# Patient Record
Sex: Female | Born: 1935 | Race: White | Hispanic: No
Health system: Southern US, Community
[De-identification: ages and names within clinical notes are randomized; demographics above are authoritative.]

## PROBLEM LIST (undated history)

## (undated) DIAGNOSIS — H9190 Unspecified hearing loss, unspecified ear: Secondary | ICD-10-CM

## (undated) DIAGNOSIS — M810 Age-related osteoporosis without current pathological fracture: Secondary | ICD-10-CM

## (undated) DIAGNOSIS — I10 Essential (primary) hypertension: Secondary | ICD-10-CM

## (undated) DIAGNOSIS — R011 Cardiac murmur, unspecified: Secondary | ICD-10-CM

## (undated) DIAGNOSIS — Z8619 Personal history of other infectious and parasitic diseases: Secondary | ICD-10-CM

## (undated) DIAGNOSIS — K648 Other hemorrhoids: Secondary | ICD-10-CM

## (undated) DIAGNOSIS — Z860100 Personal history of colon polyps, unspecified: Secondary | ICD-10-CM

## (undated) DIAGNOSIS — IMO0002 Reserved for concepts with insufficient information to code with codable children: Secondary | ICD-10-CM

## (undated) HISTORY — DX: Personal history of colon polyps, unspecified: Z86.0100

## (undated) HISTORY — DX: Essential (primary) hypertension: I10

## (undated) HISTORY — DX: Reserved for concepts with insufficient information to code with codable children: IMO0002

## (undated) HISTORY — DX: Cardiac murmur, unspecified: R01.1

## (undated) HISTORY — DX: Personal history of colonic polyps: Z86.010

## (undated) HISTORY — DX: Other hemorrhoids: K64.8

## (undated) HISTORY — DX: Personal history of other infectious and parasitic diseases: Z86.19

---
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
## 2007-04-23 ENCOUNTER — Ambulatory Visit: Payer: Self-pay | Admitting: Gastroenterology

## 2009-04-05 ENCOUNTER — Ambulatory Visit: Payer: Self-pay

## 2010-04-11 ENCOUNTER — Ambulatory Visit: Payer: Self-pay | Admitting: *Deleted

## 2011-05-07 ENCOUNTER — Ambulatory Visit: Payer: Self-pay | Admitting: Internal Medicine

## 2011-06-27 DIAGNOSIS — E785 Hyperlipidemia, unspecified: Secondary | ICD-10-CM | POA: Diagnosis not present

## 2011-06-27 DIAGNOSIS — Z0000 Encounter for general adult medical examination without abnormal findings: Secondary | ICD-10-CM | POA: Diagnosis not present

## 2011-06-27 DIAGNOSIS — M949 Disorder of cartilage, unspecified: Secondary | ICD-10-CM | POA: Diagnosis not present

## 2011-06-27 DIAGNOSIS — Z79899 Other long term (current) drug therapy: Secondary | ICD-10-CM | POA: Diagnosis not present

## 2011-07-04 DIAGNOSIS — M810 Age-related osteoporosis without current pathological fracture: Secondary | ICD-10-CM | POA: Diagnosis not present

## 2011-07-04 DIAGNOSIS — M129 Arthropathy, unspecified: Secondary | ICD-10-CM | POA: Diagnosis not present

## 2011-07-04 DIAGNOSIS — J3089 Other allergic rhinitis: Secondary | ICD-10-CM | POA: Diagnosis not present

## 2011-07-04 DIAGNOSIS — I10 Essential (primary) hypertension: Secondary | ICD-10-CM | POA: Diagnosis not present

## 2011-07-25 DIAGNOSIS — R300 Dysuria: Secondary | ICD-10-CM | POA: Diagnosis not present

## 2011-08-05 DIAGNOSIS — N390 Urinary tract infection, site not specified: Secondary | ICD-10-CM | POA: Diagnosis not present

## 2011-08-15 DIAGNOSIS — J309 Allergic rhinitis, unspecified: Secondary | ICD-10-CM | POA: Diagnosis not present

## 2011-08-15 DIAGNOSIS — H903 Sensorineural hearing loss, bilateral: Secondary | ICD-10-CM | POA: Diagnosis not present

## 2011-08-15 DIAGNOSIS — H6120 Impacted cerumen, unspecified ear: Secondary | ICD-10-CM | POA: Diagnosis not present

## 2011-09-05 DIAGNOSIS — L570 Actinic keratosis: Secondary | ICD-10-CM | POA: Diagnosis not present

## 2011-09-05 DIAGNOSIS — Z85828 Personal history of other malignant neoplasm of skin: Secondary | ICD-10-CM | POA: Diagnosis not present

## 2011-09-05 DIAGNOSIS — L821 Other seborrheic keratosis: Secondary | ICD-10-CM | POA: Diagnosis not present

## 2011-10-15 DIAGNOSIS — R21 Rash and other nonspecific skin eruption: Secondary | ICD-10-CM | POA: Diagnosis not present

## 2011-11-07 DIAGNOSIS — I359 Nonrheumatic aortic valve disorder, unspecified: Secondary | ICD-10-CM | POA: Diagnosis not present

## 2011-11-07 DIAGNOSIS — R0602 Shortness of breath: Secondary | ICD-10-CM | POA: Diagnosis not present

## 2011-11-07 DIAGNOSIS — I119 Hypertensive heart disease without heart failure: Secondary | ICD-10-CM | POA: Diagnosis not present

## 2011-12-20 DIAGNOSIS — M549 Dorsalgia, unspecified: Secondary | ICD-10-CM | POA: Diagnosis not present

## 2011-12-23 DIAGNOSIS — M549 Dorsalgia, unspecified: Secondary | ICD-10-CM | POA: Diagnosis not present

## 2011-12-23 DIAGNOSIS — M545 Low back pain: Secondary | ICD-10-CM | POA: Diagnosis not present

## 2011-12-25 DIAGNOSIS — M6281 Muscle weakness (generalized): Secondary | ICD-10-CM | POA: Diagnosis not present

## 2011-12-25 DIAGNOSIS — R262 Difficulty in walking, not elsewhere classified: Secondary | ICD-10-CM | POA: Diagnosis not present

## 2011-12-25 DIAGNOSIS — M546 Pain in thoracic spine: Secondary | ICD-10-CM | POA: Diagnosis not present

## 2011-12-27 DIAGNOSIS — M6281 Muscle weakness (generalized): Secondary | ICD-10-CM | POA: Diagnosis not present

## 2011-12-27 DIAGNOSIS — M546 Pain in thoracic spine: Secondary | ICD-10-CM | POA: Diagnosis not present

## 2011-12-27 DIAGNOSIS — R262 Difficulty in walking, not elsewhere classified: Secondary | ICD-10-CM | POA: Diagnosis not present

## 2011-12-30 DIAGNOSIS — M546 Pain in thoracic spine: Secondary | ICD-10-CM | POA: Diagnosis not present

## 2011-12-30 DIAGNOSIS — R262 Difficulty in walking, not elsewhere classified: Secondary | ICD-10-CM | POA: Diagnosis not present

## 2011-12-30 DIAGNOSIS — M6281 Muscle weakness (generalized): Secondary | ICD-10-CM | POA: Diagnosis not present

## 2012-01-01 DIAGNOSIS — M546 Pain in thoracic spine: Secondary | ICD-10-CM | POA: Diagnosis not present

## 2012-01-01 DIAGNOSIS — M6281 Muscle weakness (generalized): Secondary | ICD-10-CM | POA: Diagnosis not present

## 2012-01-01 DIAGNOSIS — R262 Difficulty in walking, not elsewhere classified: Secondary | ICD-10-CM | POA: Diagnosis not present

## 2012-01-02 DIAGNOSIS — R262 Difficulty in walking, not elsewhere classified: Secondary | ICD-10-CM | POA: Diagnosis not present

## 2012-01-02 DIAGNOSIS — M6281 Muscle weakness (generalized): Secondary | ICD-10-CM | POA: Diagnosis not present

## 2012-01-02 DIAGNOSIS — M546 Pain in thoracic spine: Secondary | ICD-10-CM | POA: Diagnosis not present

## 2012-01-02 DIAGNOSIS — I10 Essential (primary) hypertension: Secondary | ICD-10-CM | POA: Diagnosis not present

## 2012-01-03 DIAGNOSIS — M546 Pain in thoracic spine: Secondary | ICD-10-CM | POA: Diagnosis not present

## 2012-01-03 DIAGNOSIS — R262 Difficulty in walking, not elsewhere classified: Secondary | ICD-10-CM | POA: Diagnosis not present

## 2012-01-03 DIAGNOSIS — M6281 Muscle weakness (generalized): Secondary | ICD-10-CM | POA: Diagnosis not present

## 2012-01-06 DIAGNOSIS — M6281 Muscle weakness (generalized): Secondary | ICD-10-CM | POA: Diagnosis not present

## 2012-01-06 DIAGNOSIS — M546 Pain in thoracic spine: Secondary | ICD-10-CM | POA: Diagnosis not present

## 2012-01-06 DIAGNOSIS — R262 Difficulty in walking, not elsewhere classified: Secondary | ICD-10-CM | POA: Diagnosis not present

## 2012-01-08 DIAGNOSIS — M546 Pain in thoracic spine: Secondary | ICD-10-CM | POA: Diagnosis not present

## 2012-01-08 DIAGNOSIS — R262 Difficulty in walking, not elsewhere classified: Secondary | ICD-10-CM | POA: Diagnosis not present

## 2012-01-08 DIAGNOSIS — M6281 Muscle weakness (generalized): Secondary | ICD-10-CM | POA: Diagnosis not present

## 2012-01-09 DIAGNOSIS — M546 Pain in thoracic spine: Secondary | ICD-10-CM | POA: Diagnosis not present

## 2012-01-09 DIAGNOSIS — R262 Difficulty in walking, not elsewhere classified: Secondary | ICD-10-CM | POA: Diagnosis not present

## 2012-01-09 DIAGNOSIS — M6281 Muscle weakness (generalized): Secondary | ICD-10-CM | POA: Diagnosis not present

## 2012-01-13 DIAGNOSIS — M6281 Muscle weakness (generalized): Secondary | ICD-10-CM | POA: Diagnosis not present

## 2012-01-13 DIAGNOSIS — R262 Difficulty in walking, not elsewhere classified: Secondary | ICD-10-CM | POA: Diagnosis not present

## 2012-01-13 DIAGNOSIS — M546 Pain in thoracic spine: Secondary | ICD-10-CM | POA: Diagnosis not present

## 2012-01-15 DIAGNOSIS — M546 Pain in thoracic spine: Secondary | ICD-10-CM | POA: Diagnosis not present

## 2012-01-15 DIAGNOSIS — R262 Difficulty in walking, not elsewhere classified: Secondary | ICD-10-CM | POA: Diagnosis not present

## 2012-01-15 DIAGNOSIS — M6281 Muscle weakness (generalized): Secondary | ICD-10-CM | POA: Diagnosis not present

## 2012-01-16 DIAGNOSIS — R262 Difficulty in walking, not elsewhere classified: Secondary | ICD-10-CM | POA: Diagnosis not present

## 2012-01-16 DIAGNOSIS — M6281 Muscle weakness (generalized): Secondary | ICD-10-CM | POA: Diagnosis not present

## 2012-01-16 DIAGNOSIS — M546 Pain in thoracic spine: Secondary | ICD-10-CM | POA: Diagnosis not present

## 2012-01-20 DIAGNOSIS — M546 Pain in thoracic spine: Secondary | ICD-10-CM | POA: Diagnosis not present

## 2012-01-20 DIAGNOSIS — R262 Difficulty in walking, not elsewhere classified: Secondary | ICD-10-CM | POA: Diagnosis not present

## 2012-01-20 DIAGNOSIS — M6281 Muscle weakness (generalized): Secondary | ICD-10-CM | POA: Diagnosis not present

## 2012-01-21 DIAGNOSIS — M549 Dorsalgia, unspecified: Secondary | ICD-10-CM | POA: Diagnosis not present

## 2012-01-21 DIAGNOSIS — J3089 Other allergic rhinitis: Secondary | ICD-10-CM | POA: Diagnosis not present

## 2012-01-21 DIAGNOSIS — I10 Essential (primary) hypertension: Secondary | ICD-10-CM | POA: Diagnosis not present

## 2012-01-21 DIAGNOSIS — E785 Hyperlipidemia, unspecified: Secondary | ICD-10-CM | POA: Diagnosis not present

## 2012-01-22 DIAGNOSIS — M546 Pain in thoracic spine: Secondary | ICD-10-CM | POA: Diagnosis not present

## 2012-01-22 DIAGNOSIS — M6281 Muscle weakness (generalized): Secondary | ICD-10-CM | POA: Diagnosis not present

## 2012-01-22 DIAGNOSIS — R262 Difficulty in walking, not elsewhere classified: Secondary | ICD-10-CM | POA: Diagnosis not present

## 2012-01-23 DIAGNOSIS — M6281 Muscle weakness (generalized): Secondary | ICD-10-CM | POA: Diagnosis not present

## 2012-01-23 DIAGNOSIS — M546 Pain in thoracic spine: Secondary | ICD-10-CM | POA: Diagnosis not present

## 2012-01-23 DIAGNOSIS — R262 Difficulty in walking, not elsewhere classified: Secondary | ICD-10-CM | POA: Diagnosis not present

## 2012-01-27 DIAGNOSIS — M6281 Muscle weakness (generalized): Secondary | ICD-10-CM | POA: Diagnosis not present

## 2012-01-27 DIAGNOSIS — R262 Difficulty in walking, not elsewhere classified: Secondary | ICD-10-CM | POA: Diagnosis not present

## 2012-01-27 DIAGNOSIS — M546 Pain in thoracic spine: Secondary | ICD-10-CM | POA: Diagnosis not present

## 2012-01-28 DIAGNOSIS — M546 Pain in thoracic spine: Secondary | ICD-10-CM | POA: Diagnosis not present

## 2012-01-28 DIAGNOSIS — M6281 Muscle weakness (generalized): Secondary | ICD-10-CM | POA: Diagnosis not present

## 2012-01-28 DIAGNOSIS — R262 Difficulty in walking, not elsewhere classified: Secondary | ICD-10-CM | POA: Diagnosis not present

## 2012-01-30 DIAGNOSIS — M546 Pain in thoracic spine: Secondary | ICD-10-CM | POA: Diagnosis not present

## 2012-01-30 DIAGNOSIS — R262 Difficulty in walking, not elsewhere classified: Secondary | ICD-10-CM | POA: Diagnosis not present

## 2012-01-30 DIAGNOSIS — M6281 Muscle weakness (generalized): Secondary | ICD-10-CM | POA: Diagnosis not present

## 2012-02-12 DIAGNOSIS — H6120 Impacted cerumen, unspecified ear: Secondary | ICD-10-CM | POA: Diagnosis not present

## 2012-02-12 DIAGNOSIS — H903 Sensorineural hearing loss, bilateral: Secondary | ICD-10-CM | POA: Diagnosis not present

## 2012-03-17 DIAGNOSIS — L578 Other skin changes due to chronic exposure to nonionizing radiation: Secondary | ICD-10-CM | POA: Diagnosis not present

## 2012-03-17 DIAGNOSIS — Z85828 Personal history of other malignant neoplasm of skin: Secondary | ICD-10-CM | POA: Diagnosis not present

## 2012-03-17 DIAGNOSIS — L570 Actinic keratosis: Secondary | ICD-10-CM | POA: Diagnosis not present

## 2012-03-25 DIAGNOSIS — G894 Chronic pain syndrome: Secondary | ICD-10-CM | POA: Diagnosis not present

## 2012-03-25 DIAGNOSIS — M549 Dorsalgia, unspecified: Secondary | ICD-10-CM | POA: Diagnosis not present

## 2012-03-31 DIAGNOSIS — M47814 Spondylosis without myelopathy or radiculopathy, thoracic region: Secondary | ICD-10-CM | POA: Diagnosis not present

## 2012-03-31 DIAGNOSIS — M5414 Radiculopathy, thoracic region: Secondary | ICD-10-CM | POA: Diagnosis not present

## 2012-04-03 ENCOUNTER — Ambulatory Visit: Payer: Self-pay | Admitting: Physical Medicine and Rehabilitation

## 2012-04-03 DIAGNOSIS — M5124 Other intervertebral disc displacement, thoracic region: Secondary | ICD-10-CM | POA: Diagnosis not present

## 2012-04-03 DIAGNOSIS — M8448XA Pathological fracture, other site, initial encounter for fracture: Secondary | ICD-10-CM | POA: Diagnosis not present

## 2012-04-03 DIAGNOSIS — S22009A Unspecified fracture of unspecified thoracic vertebra, initial encounter for closed fracture: Secondary | ICD-10-CM | POA: Diagnosis not present

## 2012-04-08 DIAGNOSIS — R142 Eructation: Secondary | ICD-10-CM | POA: Diagnosis not present

## 2012-04-08 DIAGNOSIS — R141 Gas pain: Secondary | ICD-10-CM | POA: Diagnosis not present

## 2012-04-08 DIAGNOSIS — R634 Abnormal weight loss: Secondary | ICD-10-CM | POA: Diagnosis not present

## 2012-04-08 DIAGNOSIS — N952 Postmenopausal atrophic vaginitis: Secondary | ICD-10-CM | POA: Diagnosis not present

## 2012-04-10 DIAGNOSIS — M5414 Radiculopathy, thoracic region: Secondary | ICD-10-CM | POA: Diagnosis not present

## 2012-04-10 DIAGNOSIS — M47814 Spondylosis without myelopathy or radiculopathy, thoracic region: Secondary | ICD-10-CM | POA: Diagnosis not present

## 2012-04-15 DIAGNOSIS — R634 Abnormal weight loss: Secondary | ICD-10-CM | POA: Diagnosis not present

## 2012-04-15 DIAGNOSIS — R142 Eructation: Secondary | ICD-10-CM | POA: Diagnosis not present

## 2012-04-15 DIAGNOSIS — N952 Postmenopausal atrophic vaginitis: Secondary | ICD-10-CM | POA: Diagnosis not present

## 2012-04-15 DIAGNOSIS — R141 Gas pain: Secondary | ICD-10-CM | POA: Diagnosis not present

## 2012-04-21 DIAGNOSIS — M47817 Spondylosis without myelopathy or radiculopathy, lumbosacral region: Secondary | ICD-10-CM | POA: Diagnosis not present

## 2012-04-21 DIAGNOSIS — IMO0002 Reserved for concepts with insufficient information to code with codable children: Secondary | ICD-10-CM | POA: Diagnosis not present

## 2012-04-21 DIAGNOSIS — M47814 Spondylosis without myelopathy or radiculopathy, thoracic region: Secondary | ICD-10-CM | POA: Diagnosis not present

## 2012-04-30 DIAGNOSIS — Z23 Encounter for immunization: Secondary | ICD-10-CM | POA: Diagnosis not present

## 2012-05-12 DIAGNOSIS — M47814 Spondylosis without myelopathy or radiculopathy, thoracic region: Secondary | ICD-10-CM | POA: Diagnosis not present

## 2012-05-12 DIAGNOSIS — IMO0002 Reserved for concepts with insufficient information to code with codable children: Secondary | ICD-10-CM | POA: Diagnosis not present

## 2012-05-12 DIAGNOSIS — M47817 Spondylosis without myelopathy or radiculopathy, lumbosacral region: Secondary | ICD-10-CM | POA: Diagnosis not present

## 2012-05-19 DIAGNOSIS — I119 Hypertensive heart disease without heart failure: Secondary | ICD-10-CM | POA: Diagnosis not present

## 2012-05-19 DIAGNOSIS — R0602 Shortness of breath: Secondary | ICD-10-CM | POA: Diagnosis not present

## 2012-05-19 DIAGNOSIS — I359 Nonrheumatic aortic valve disorder, unspecified: Secondary | ICD-10-CM | POA: Diagnosis not present

## 2012-05-29 DIAGNOSIS — IMO0002 Reserved for concepts with insufficient information to code with codable children: Secondary | ICD-10-CM | POA: Diagnosis not present

## 2012-05-29 DIAGNOSIS — M47817 Spondylosis without myelopathy or radiculopathy, lumbosacral region: Secondary | ICD-10-CM | POA: Diagnosis not present

## 2012-06-05 DIAGNOSIS — H02839 Dermatochalasis of unspecified eye, unspecified eyelid: Secondary | ICD-10-CM | POA: Diagnosis not present

## 2012-06-09 ENCOUNTER — Ambulatory Visit: Payer: Self-pay | Admitting: Internal Medicine

## 2012-06-09 DIAGNOSIS — Z1231 Encounter for screening mammogram for malignant neoplasm of breast: Secondary | ICD-10-CM | POA: Diagnosis not present

## 2012-06-29 DIAGNOSIS — E785 Hyperlipidemia, unspecified: Secondary | ICD-10-CM | POA: Diagnosis not present

## 2012-06-30 DIAGNOSIS — IMO0002 Reserved for concepts with insufficient information to code with codable children: Secondary | ICD-10-CM | POA: Diagnosis not present

## 2012-06-30 DIAGNOSIS — M48062 Spinal stenosis, lumbar region with neurogenic claudication: Secondary | ICD-10-CM | POA: Diagnosis not present

## 2012-06-30 DIAGNOSIS — M47817 Spondylosis without myelopathy or radiculopathy, lumbosacral region: Secondary | ICD-10-CM | POA: Diagnosis not present

## 2012-07-06 DIAGNOSIS — G894 Chronic pain syndrome: Secondary | ICD-10-CM | POA: Diagnosis not present

## 2012-07-06 DIAGNOSIS — M549 Dorsalgia, unspecified: Secondary | ICD-10-CM | POA: Diagnosis not present

## 2012-07-06 DIAGNOSIS — I10 Essential (primary) hypertension: Secondary | ICD-10-CM | POA: Diagnosis not present

## 2012-07-09 ENCOUNTER — Ambulatory Visit: Payer: Self-pay | Admitting: Gastroenterology

## 2012-07-09 DIAGNOSIS — Z860100 Personal history of colon polyps, unspecified: Secondary | ICD-10-CM | POA: Diagnosis not present

## 2012-07-09 DIAGNOSIS — I10 Essential (primary) hypertension: Secondary | ICD-10-CM | POA: Diagnosis not present

## 2012-07-09 DIAGNOSIS — M545 Low back pain, unspecified: Secondary | ICD-10-CM | POA: Diagnosis not present

## 2012-07-09 DIAGNOSIS — Z7982 Long term (current) use of aspirin: Secondary | ICD-10-CM | POA: Diagnosis not present

## 2012-07-09 DIAGNOSIS — Z79899 Other long term (current) drug therapy: Secondary | ICD-10-CM | POA: Diagnosis not present

## 2012-07-09 DIAGNOSIS — Z09 Encounter for follow-up examination after completed treatment for conditions other than malignant neoplasm: Secondary | ICD-10-CM | POA: Diagnosis not present

## 2012-07-09 DIAGNOSIS — Z800 Family history of malignant neoplasm of digestive organs: Secondary | ICD-10-CM | POA: Diagnosis not present

## 2012-07-09 DIAGNOSIS — K5730 Diverticulosis of large intestine without perforation or abscess without bleeding: Secondary | ICD-10-CM | POA: Diagnosis not present

## 2012-07-09 DIAGNOSIS — R011 Cardiac murmur, unspecified: Secondary | ICD-10-CM | POA: Diagnosis not present

## 2012-07-09 LAB — HM COLONOSCOPY

## 2012-09-03 DIAGNOSIS — Z85828 Personal history of other malignant neoplasm of skin: Secondary | ICD-10-CM | POA: Diagnosis not present

## 2012-09-03 DIAGNOSIS — L821 Other seborrheic keratosis: Secondary | ICD-10-CM | POA: Diagnosis not present

## 2012-09-03 DIAGNOSIS — L570 Actinic keratosis: Secondary | ICD-10-CM | POA: Diagnosis not present

## 2012-09-30 DIAGNOSIS — M47817 Spondylosis without myelopathy or radiculopathy, lumbosacral region: Secondary | ICD-10-CM | POA: Diagnosis not present

## 2012-09-30 DIAGNOSIS — IMO0002 Reserved for concepts with insufficient information to code with codable children: Secondary | ICD-10-CM | POA: Diagnosis not present

## 2012-11-19 DIAGNOSIS — I119 Hypertensive heart disease without heart failure: Secondary | ICD-10-CM | POA: Diagnosis not present

## 2012-11-19 DIAGNOSIS — R0602 Shortness of breath: Secondary | ICD-10-CM | POA: Diagnosis not present

## 2012-11-19 DIAGNOSIS — I359 Nonrheumatic aortic valve disorder, unspecified: Secondary | ICD-10-CM | POA: Diagnosis not present

## 2012-11-30 DIAGNOSIS — R0602 Shortness of breath: Secondary | ICD-10-CM | POA: Diagnosis not present

## 2012-11-30 DIAGNOSIS — I119 Hypertensive heart disease without heart failure: Secondary | ICD-10-CM | POA: Diagnosis not present

## 2012-11-30 DIAGNOSIS — E782 Mixed hyperlipidemia: Secondary | ICD-10-CM | POA: Diagnosis not present

## 2012-11-30 DIAGNOSIS — I359 Nonrheumatic aortic valve disorder, unspecified: Secondary | ICD-10-CM | POA: Diagnosis not present

## 2012-12-31 DIAGNOSIS — I10 Essential (primary) hypertension: Secondary | ICD-10-CM | POA: Diagnosis not present

## 2013-01-04 DIAGNOSIS — R0602 Shortness of breath: Secondary | ICD-10-CM | POA: Diagnosis not present

## 2013-01-04 DIAGNOSIS — I359 Nonrheumatic aortic valve disorder, unspecified: Secondary | ICD-10-CM | POA: Diagnosis not present

## 2013-01-07 DIAGNOSIS — I119 Hypertensive heart disease without heart failure: Secondary | ICD-10-CM | POA: Diagnosis not present

## 2013-01-07 DIAGNOSIS — I359 Nonrheumatic aortic valve disorder, unspecified: Secondary | ICD-10-CM | POA: Diagnosis not present

## 2013-01-07 DIAGNOSIS — I495 Sick sinus syndrome: Secondary | ICD-10-CM | POA: Diagnosis not present

## 2013-01-07 DIAGNOSIS — I519 Heart disease, unspecified: Secondary | ICD-10-CM | POA: Diagnosis not present

## 2013-01-11 DIAGNOSIS — I10 Essential (primary) hypertension: Secondary | ICD-10-CM | POA: Diagnosis not present

## 2013-01-11 DIAGNOSIS — M129 Arthropathy, unspecified: Secondary | ICD-10-CM | POA: Diagnosis not present

## 2013-01-18 DIAGNOSIS — H6120 Impacted cerumen, unspecified ear: Secondary | ICD-10-CM | POA: Diagnosis not present

## 2013-01-18 DIAGNOSIS — H903 Sensorineural hearing loss, bilateral: Secondary | ICD-10-CM | POA: Diagnosis not present

## 2013-03-11 DIAGNOSIS — I495 Sick sinus syndrome: Secondary | ICD-10-CM | POA: Diagnosis not present

## 2013-03-11 DIAGNOSIS — I119 Hypertensive heart disease without heart failure: Secondary | ICD-10-CM | POA: Diagnosis not present

## 2013-03-11 DIAGNOSIS — E782 Mixed hyperlipidemia: Secondary | ICD-10-CM | POA: Diagnosis not present

## 2013-03-11 DIAGNOSIS — I059 Rheumatic mitral valve disease, unspecified: Secondary | ICD-10-CM | POA: Diagnosis not present

## 2013-03-17 ENCOUNTER — Encounter: Payer: Self-pay | Admitting: Internal Medicine

## 2013-03-17 ENCOUNTER — Ambulatory Visit (INDEPENDENT_AMBULATORY_CARE_PROVIDER_SITE_OTHER): Payer: Medicare Other | Admitting: Internal Medicine

## 2013-03-17 VITALS — BP 110/80 | HR 62 | Temp 98.0°F | Ht 61.5 in | Wt 127.0 lb

## 2013-03-17 DIAGNOSIS — A6000 Herpesviral infection of urogenital system, unspecified: Secondary | ICD-10-CM

## 2013-03-17 DIAGNOSIS — A6009 Herpesviral infection of other urogenital tract: Secondary | ICD-10-CM

## 2013-03-17 DIAGNOSIS — R140 Abdominal distension (gaseous): Secondary | ICD-10-CM

## 2013-03-17 DIAGNOSIS — T148XXA Other injury of unspecified body region, initial encounter: Secondary | ICD-10-CM

## 2013-03-17 DIAGNOSIS — R141 Gas pain: Secondary | ICD-10-CM

## 2013-03-17 DIAGNOSIS — Z860100 Personal history of colon polyps, unspecified: Secondary | ICD-10-CM

## 2013-03-17 DIAGNOSIS — R634 Abnormal weight loss: Secondary | ICD-10-CM

## 2013-03-17 DIAGNOSIS — IMO0002 Reserved for concepts with insufficient information to code with codable children: Secondary | ICD-10-CM

## 2013-03-17 DIAGNOSIS — I10 Essential (primary) hypertension: Secondary | ICD-10-CM

## 2013-03-17 NOTE — Patient Instructions (Addendum)
Align - one per day 

## 2013-03-18 ENCOUNTER — Ambulatory Visit: Payer: Self-pay | Admitting: Internal Medicine

## 2013-03-20 ENCOUNTER — Encounter: Payer: Self-pay | Admitting: Internal Medicine

## 2013-03-20 DIAGNOSIS — R634 Abnormal weight loss: Secondary | ICD-10-CM | POA: Insufficient documentation

## 2013-03-20 DIAGNOSIS — R140 Abdominal distension (gaseous): Secondary | ICD-10-CM | POA: Insufficient documentation

## 2013-03-20 DIAGNOSIS — Z86010 Personal history of colonic polyps: Secondary | ICD-10-CM | POA: Insufficient documentation

## 2013-03-20 DIAGNOSIS — A6009 Herpesviral infection of other urogenital tract: Secondary | ICD-10-CM | POA: Insufficient documentation

## 2013-03-20 DIAGNOSIS — Z8781 Personal history of (healed) traumatic fracture: Secondary | ICD-10-CM | POA: Insufficient documentation

## 2013-03-20 DIAGNOSIS — I10 Essential (primary) hypertension: Secondary | ICD-10-CM | POA: Insufficient documentation

## 2013-03-20 NOTE — Assessment & Plan Note (Signed)
Has a flare 2-3x/year.  Takes valtrex prn.  Follow.

## 2013-03-20 NOTE — Assessment & Plan Note (Signed)
Obtain records to review previously work up.  Follow.

## 2013-03-20 NOTE — Assessment & Plan Note (Signed)
Blood pressure under good control.  Same medication regimen.  Follow metabolic panel.   

## 2013-03-20 NOTE — Progress Notes (Signed)
Subjective:    Patient ID: Lisa Blackwell, female    DOB: [birth year], 79 y.o.   MRN: [id number]  HPI 79 year old female with past history of compression fractures, hypertension and colon polyps who comes in today to follow up on these issues as well as to establish care.  She was previously seeing Dr Alonna Buckler.  She is seeing Dr Yves Dill for back pain.  Has had thoracic compression fractures.  Previously went to physical therapy.  Helped.  Had reoccurring pain.  Dr Yves Dill gave injections.  Helped.  Takes hydrocodone (1/2 tablet q hs prn).  Also using patches.   She has lost weight.  Stomach bloated.  Saw gyn (Dr Beatris Si).  Pelvic ultrasound negative.  Colonoscopy 1/14.  No polyps.  Diverticulosis.  No constipation.  Eating.  No abdominal pain.  No urine change.  No blood in her stool.  No acid reflux.     Past Medical History  Diagnosis Date  . History of chicken pox   . Heart murmur   . Hypertension   . Compression fracture     Spine  . Hx of colonic polyp     Outpatient Encounter Prescriptions as of 03/17/2013  Medication Sig Dispense Refill  . aspirin EC 81 MG tablet Take 81 mg by mouth daily.      . calcium citrate-vitamin D (CITRACAL+D) 315-200 MG-UNIT per tablet Take 1 tablet by mouth daily.      . Cholecalciferol (VITAMIN D) 2000 UNITS tablet Take 2,000 Units by mouth daily.      Marland Kitchen gabapentin (NEURONTIN) 100 MG capsule Take 100 mg by mouth 3 (three) times daily.      Marland Kitchen HYDROcodone-acetaminophen (NORCO/VICODIN) 5-325 MG per tablet Take 0.5 tablets by mouth every 6 (six) hours as needed for pain.      Marland Kitchen ipratropium (ATROVENT) 0.03 % nasal spray Place 2 sprays into the nose every 12 (twelve) hours.      . raloxifene (EVISTA) 60 MG tablet Take 60 mg by mouth daily.      Marland Kitchen telmisartan-hydrochlorothiazide (MICARDIS HCT) 40-12.5 MG per tablet Take 0.5 tablets by mouth daily.      . valACYclovir (VALTREX) 500 MG tablet Take 500 mg by mouth 2 (two) times daily as needed.       No  facility-administered encounter medications on file as of 03/17/2013.    Review of Systems Patient denies any headache, lightheadedness or dizziness.  No sinus or allergy symptoms.  No chest pain, tightness or palpitations.  No increased shortness of breath, cough or congestion.  No nausea or vomiting.  No acid reflux.  No abdominal pain or cramping.  Does report bloating.  Weight loss.  No bowel change, such as diarrhea, constipation, BRBPR or melana.  No urine change.  Back pain.  Seeing Dr Yves Dill.  Previous episode of syncope.  Worked up by cardiology.  No further episodes.  Living at [facility].       Objective:   Physical Exam Filed Vitals:   03/17/13 1552  BP: 110/80  Pulse: 62  Temp: 98 F (78.63 C)   79 year old female in no acute distress.   HEENT:  Nares- clear.  Oropharynx - without lesions. NECK:  Supple.  Nontender.  No audible bruit.  HEART:  Appears to be regular. LUNGS:  No crackles or wheezing audible.  Respirations even and unlabored.  RADIAL PULSE:  Equal bilaterally.  ABDOMEN:  Soft, nontender.  Bowel sounds present and normal.  No audible  abdominal bruit.    EXTREMITIES:  No increased edema present.  DP pulses palpable and equal bilaterally.          Assessment & Plan:  HEALTH MAINTENANCE.  Schedule her for a physical when due.  Obtain records to review.  Colonoscopy 1/14.  Mammogram normal 06/09/12.  Last pap 10/10.    I spent 45 minutes with the patient and more than 50% of the times was spent in consultation regarding the above.

## 2013-03-20 NOTE — Assessment & Plan Note (Signed)
Last colonoscopy 1/14.  Followed by Dr Bluford Kaufmann.  Obtain records.

## 2013-03-20 NOTE — Assessment & Plan Note (Signed)
Start align daily.  Obtain records.  Get her back in soon to reassess.  Further w/up pending.

## 2013-03-20 NOTE — Assessment & Plan Note (Signed)
Back pain.  S/p injections.  Followed by Dr Yves Dill.

## 2013-04-04 DIAGNOSIS — Z23 Encounter for immunization: Secondary | ICD-10-CM | POA: Diagnosis not present

## 2013-05-06 ENCOUNTER — Ambulatory Visit (INDEPENDENT_AMBULATORY_CARE_PROVIDER_SITE_OTHER): Payer: BC Managed Care – PPO | Admitting: Internal Medicine

## 2013-05-06 ENCOUNTER — Encounter: Payer: Self-pay | Admitting: Internal Medicine

## 2013-05-06 VITALS — BP 130/80 | HR 64 | Temp 98.2°F | Ht 62.0 in | Wt 127.5 lb

## 2013-05-06 DIAGNOSIS — I10 Essential (primary) hypertension: Secondary | ICD-10-CM

## 2013-05-06 DIAGNOSIS — R141 Gas pain: Secondary | ICD-10-CM | POA: Diagnosis not present

## 2013-05-06 DIAGNOSIS — K649 Unspecified hemorrhoids: Secondary | ICD-10-CM

## 2013-05-06 DIAGNOSIS — T148XXA Other injury of unspecified body region, initial encounter: Secondary | ICD-10-CM

## 2013-05-06 DIAGNOSIS — A6009 Herpesviral infection of other urogenital tract: Secondary | ICD-10-CM

## 2013-05-06 DIAGNOSIS — Z86010 Personal history of colonic polyps: Secondary | ICD-10-CM

## 2013-05-06 DIAGNOSIS — R140 Abdominal distension (gaseous): Secondary | ICD-10-CM

## 2013-05-06 DIAGNOSIS — IMO0002 Reserved for concepts with insufficient information to code with codable children: Secondary | ICD-10-CM

## 2013-05-06 DIAGNOSIS — Z780 Asymptomatic menopausal state: Secondary | ICD-10-CM

## 2013-05-06 DIAGNOSIS — Z13220 Encounter for screening for lipoid disorders: Secondary | ICD-10-CM

## 2013-05-06 DIAGNOSIS — A6000 Herpesviral infection of urogenital system, unspecified: Secondary | ICD-10-CM

## 2013-05-06 DIAGNOSIS — R634 Abnormal weight loss: Secondary | ICD-10-CM

## 2013-05-06 NOTE — Progress Notes (Signed)
Pre-visit discussion using our clinic review tool. No additional management support is needed unless otherwise documented below in the visit note.  

## 2013-05-09 ENCOUNTER — Encounter: Payer: Self-pay | Admitting: Internal Medicine

## 2013-05-09 DIAGNOSIS — K649 Unspecified hemorrhoids: Secondary | ICD-10-CM | POA: Insufficient documentation

## 2013-05-09 NOTE — Assessment & Plan Note (Signed)
Noted on exam.  Had bright red blood last week.  None since.  Treat with annusol HC suppositories as directed.  Notify me if persistent problems.

## 2013-05-09 NOTE — Assessment & Plan Note (Signed)
Has a flare 2-3x/year.  Takes valtrex prn.  Follow.

## 2013-05-09 NOTE — Assessment & Plan Note (Signed)
Last colonoscopy 1/14.  Followed by Dr Oh.    

## 2013-05-09 NOTE — Assessment & Plan Note (Signed)
Back pain.  S/p injections.  Followed by Dr Yves Dill.

## 2013-05-09 NOTE — Progress Notes (Signed)
Subjective:    Patient ID: Lisa Blackwell, female    DOB: Nov 04, 1925, 79 y.o.   MRN: [id number]  HPI 79 year old female with past history of compression fractures, hypertension and colon polyps who comes in today to follow up on these issues as well as for a complete physical exam.  She is seeing Dr Yves Dill for back pain.  Has had thoracic compression fractures.  Previously went to physical therapy.  Helped.  Had reoccurring pain.  Dr Yves Dill gave injections.  Helped.  Takes hydrocodone (1/2 tablet q hs prn).  Also using patches.   She has lost weight.  Stomach has been feeling bloated.  Saw gyn (Dr Beatris Si).  Pelvic ultrasound negative.  Colonoscopy 1/14.  No polyps.  Diverticulosis.  No constipation.  Eating.  No abdominal pain.  No urine change.  No blood in her stool.  No acid reflux.  We started on a probiotic last visit.  She states this has helped.  She feels better.  Weight is stable.  Breathing stable.     Past Medical History  Diagnosis Date  . History of chicken pox   . Heart murmur   . Hypertension   . Compression fracture     Spine  . Hx of colonic polyp     Outpatient Encounter Prescriptions as of 05/06/2013  Medication Sig  . aspirin EC 81 MG tablet Take 81 mg by mouth daily.  . calcium citrate-vitamin D (CITRACAL+D) 315-200 MG-UNIT per tablet Take 1 tablet by mouth daily.  . Cholecalciferol (VITAMIN D) 2000 UNITS tablet Take 2,000 Units by mouth daily.  Marland Kitchen gabapentin (NEURONTIN) 100 MG capsule Take 100 mg by mouth 3 (three) times daily.  Marland Kitchen HYDROcodone-acetaminophen (NORCO/VICODIN) 5-325 MG per tablet Take 0.5 tablets by mouth every 6 (six) hours as needed for pain.  Marland Kitchen ipratropium (ATROVENT) 0.03 % nasal spray Place 2 sprays into the nose every 12 (twelve) hours.  . Probiotic Product (ALIGN PO) Take by mouth daily.  . raloxifene (EVISTA) 60 MG tablet Take 60 mg by mouth daily.  Marland Kitchen telmisartan-hydrochlorothiazide (MICARDIS HCT) 40-12.5 MG per tablet Take 0.5 tablets by  mouth daily.  . valACYclovir (VALTREX) 500 MG tablet Take 500 mg by mouth 2 (two) times daily as needed.    Review of Systems Patient denies any headache, lightheadedness or dizziness.  No sinus or allergy symptoms.  No chest pain, tightness or palpitations.  No increased shortness of breath, cough or congestion.  No nausea or vomiting.  No acid reflux.  No abdominal pain or cramping.  Bloating better.  Weight stable from last check.   No bowel change, such as diarrhea, constipation, or melana.  Did notice some BRB last week.  States has hemorrhoids.  No blood since.  No urine change.  Back pain.  Seeing Dr Yves Dill.  Previous episode of syncope.  Worked up by cardiology.  No further episodes.  Living at [facility].       Objective:   Physical Exam  Filed Vitals:   05/06/13 1422  BP: 130/80  Pulse: 64  Temp: 98.2 F (36.8 C)   Blood pressure recheck:  98/39  79 year old female in no acute distress.   HEENT:  Nares- clear.  Oropharynx - without lesions. NECK:  Supple.  Nontender.  No audible bruit.  HEART:  Appears to be regular. LUNGS:  No crackles or wheezing audible.  Respirations even and unlabored.  RADIAL PULSE:  Equal bilaterally.    BREASTS:  No nipple  discharge or nipple retraction present.  Could not appreciate any distinct nodules or axillary adenopathy.  ABDOMEN:  Soft, nontender.  Bowel sounds present and normal.  No audible abdominal bruit.  GU:  Not performed.    RECTAL:  Hemorrhoid present.  Heme negative.    EXTREMITIES:  No increased edema present.  DP pulses palpable and equal bilaterally.          Assessment & Plan:  HEALTH MAINTENANCE.  Physical today.   Colonoscopy 1/14.  Mammogram normal 06/09/12.  Last pap 10/10.

## 2013-05-09 NOTE — Assessment & Plan Note (Signed)
Blood pressure under good control.  Same medication regimen.  Follow metabolic panel.   

## 2013-05-09 NOTE — Assessment & Plan Note (Signed)
Started align daily after last visit.  Symptoms have improved.  Has had recent colonoscopy and gyn exam.  Will follow.  Feels better.

## 2013-05-09 NOTE — Assessment & Plan Note (Signed)
Weight stable.  Colonoscopy and gyn w/up as outlined.

## 2013-05-10 ENCOUNTER — Encounter: Payer: Self-pay | Admitting: Internal Medicine

## 2013-05-10 DIAGNOSIS — I10 Essential (primary) hypertension: Secondary | ICD-10-CM

## 2013-05-11 ENCOUNTER — Encounter: Payer: Self-pay | Admitting: Internal Medicine

## 2013-05-11 ENCOUNTER — Encounter: Payer: Self-pay | Admitting: Emergency Medicine

## 2013-05-12 ENCOUNTER — Other Ambulatory Visit (INDEPENDENT_AMBULATORY_CARE_PROVIDER_SITE_OTHER): Payer: Medicare Other

## 2013-05-12 DIAGNOSIS — R634 Abnormal weight loss: Secondary | ICD-10-CM

## 2013-05-12 DIAGNOSIS — Z13220 Encounter for screening for lipoid disorders: Secondary | ICD-10-CM | POA: Diagnosis not present

## 2013-05-12 DIAGNOSIS — I10 Essential (primary) hypertension: Secondary | ICD-10-CM

## 2013-05-12 LAB — CBC WITH DIFFERENTIAL/PLATELET
Basophils Absolute: 0.1 10*3/uL (ref 0.0–0.1)
Basophils Relative: 1 % (ref 0.0–3.0)
Eosinophils Absolute: 0 10*3/uL (ref 0.0–0.7)
Hemoglobin: 13.1 g/dL (ref 12.0–15.0)
Lymphocytes Relative: 33.3 % (ref 12.0–46.0)
Lymphs Abs: 1.8 10*3/uL (ref 0.7–4.0)
MCHC: 33.5 g/dL (ref 30.0–36.0)
Monocytes Relative: 7.3 % (ref 3.0–12.0)
Neutro Abs: 3.1 10*3/uL (ref 1.4–7.7)
Platelets: 139 10*3/uL — ABNORMAL LOW (ref 150.0–400.0)
RBC: 4.45 Mil/uL (ref 3.87–5.11)
RDW: 13.6 % (ref 11.5–14.6)

## 2013-05-12 LAB — HEPATIC FUNCTION PANEL
AST: 19 U/L (ref 0–37)
Albumin: 3.6 g/dL (ref 3.5–5.2)
Total Bilirubin: 0.8 mg/dL (ref 0.3–1.2)
Total Protein: 6.4 g/dL (ref 6.0–8.3)

## 2013-05-12 LAB — LIPID PANEL
Cholesterol: 188 mg/dL (ref 0–200)
HDL: 74 mg/dL (ref >39.00)
LDL Cholesterol: 101 mg/dL — ABNORMAL HIGH (ref 0–99)
Total CHOL/HDL Ratio: 3
Triglycerides: 65 mg/dL (ref 0.0–149.0)
VLDL: 13 mg/dL (ref 0.0–40.0)

## 2013-05-12 LAB — BASIC METABOLIC PANEL
CO2: 32 mEq/L (ref 19–32)
Calcium: 9.3 mg/dL (ref 8.4–10.5)
GFR: 84.41 mL/min (ref >60.00)
Glucose, Bld: 81 mg/dL (ref 70–99)
Potassium: 3.8 mEq/L (ref 3.5–5.1)
Sodium: 140 mEq/L (ref 135–145)

## 2013-05-13 ENCOUNTER — Encounter: Payer: Self-pay | Admitting: Internal Medicine

## 2013-05-13 ENCOUNTER — Telehealth: Payer: Self-pay | Admitting: Internal Medicine

## 2013-05-13 DIAGNOSIS — D696 Thrombocytopenia, unspecified: Secondary | ICD-10-CM

## 2013-05-13 NOTE — Telephone Encounter (Signed)
Pt notified of lab results via my chart.  Needs a non fasting within the next two weeks.  Please schedule her lab appt and contact her with the appt date and time.  Thanks.

## 2013-05-14 NOTE — Telephone Encounter (Signed)
Appointment 12/3 pt aware

## 2013-05-17 ENCOUNTER — Encounter: Payer: Self-pay | Admitting: Internal Medicine

## 2013-05-26 ENCOUNTER — Other Ambulatory Visit (INDEPENDENT_AMBULATORY_CARE_PROVIDER_SITE_OTHER): Payer: BC Managed Care – PPO

## 2013-05-26 ENCOUNTER — Telehealth: Payer: Self-pay | Admitting: Internal Medicine

## 2013-05-26 DIAGNOSIS — M899 Disorder of bone, unspecified: Secondary | ICD-10-CM | POA: Diagnosis not present

## 2013-05-26 DIAGNOSIS — D696 Thrombocytopenia, unspecified: Secondary | ICD-10-CM | POA: Diagnosis not present

## 2013-05-26 DIAGNOSIS — N951 Menopausal and female climacteric states: Secondary | ICD-10-CM | POA: Diagnosis not present

## 2013-05-26 NOTE — Telephone Encounter (Signed)
I spoke with Lisa Blackwell & notified her that we have noted that she received her Tetanus in May 2013. Lisa Blackwell doesn't feel that this is correct. She will call and have Dr. Threasa Beards fax over a copy of her immunization records.

## 2013-05-26 NOTE — Telephone Encounter (Signed)
Noted.  Can review once we get and then determine if has had.  Thanks.

## 2013-05-26 NOTE — Telephone Encounter (Signed)
Pt is wondering if should could get the pneumonia and tetnus shot ??

## 2013-05-27 ENCOUNTER — Encounter: Payer: Self-pay | Admitting: Internal Medicine

## 2013-05-27 LAB — PLATELET COUNT: Platelets: 174 10*3/uL (ref 150–400)

## 2013-06-08 ENCOUNTER — Encounter: Payer: Self-pay | Admitting: Internal Medicine

## 2013-06-10 ENCOUNTER — Encounter: Payer: Self-pay | Admitting: *Deleted

## 2013-06-10 ENCOUNTER — Ambulatory Visit: Payer: Self-pay | Admitting: Internal Medicine

## 2013-06-10 DIAGNOSIS — Z1231 Encounter for screening mammogram for malignant neoplasm of breast: Secondary | ICD-10-CM | POA: Diagnosis not present

## 2013-06-10 LAB — HM MAMMOGRAPHY

## 2013-06-11 ENCOUNTER — Ambulatory Visit (INDEPENDENT_AMBULATORY_CARE_PROVIDER_SITE_OTHER): Payer: BC Managed Care – PPO | Admitting: *Deleted

## 2013-06-11 DIAGNOSIS — Z23 Encounter for immunization: Secondary | ICD-10-CM | POA: Diagnosis not present

## 2013-06-27 ENCOUNTER — Encounter: Payer: Self-pay | Admitting: Internal Medicine

## 2013-06-28 ENCOUNTER — Encounter: Payer: Self-pay | Admitting: Podiatry

## 2013-06-28 ENCOUNTER — Ambulatory Visit (INDEPENDENT_AMBULATORY_CARE_PROVIDER_SITE_OTHER): Payer: Medicare Other | Admitting: Podiatry

## 2013-06-28 VITALS — BP 113/58 | HR 62 | Resp 16 | Ht 61.0 in | Wt 126.0 lb

## 2013-06-28 DIAGNOSIS — L851 Acquired keratosis [keratoderma] palmaris et plantaris: Secondary | ICD-10-CM | POA: Diagnosis not present

## 2013-06-28 NOTE — Progress Notes (Signed)
   Subjective:    Patient ID: Lisa Blackwell, female    DOB: 03-05-1926, 80 y.o.   MRN: [id number]  HPI Comments: i want this callus taken off my left foot cause it hurts. i usually try to scrape it off but it doesn't help. Ive had it over a year and it seems to stay the same.   Foot Pain      Review of Systems  All other systems reviewed and are negative.       Objective:   Physical Exam: I have reviewed her past medical history medications allergies surgeries social history and review of systems. Vital signs are stable she is alert and oriented x3. Pulses are strongly palpable bilateral. Deep tendon reflexes are intact bilateral neurologic sensorium is intact bilateral muscle strength + over 5 dorsiflexors plantar flexors inverters everters all intrinsic musculature is intact. Cutaneous evaluation demonstrates supple well hydrated cutis with exception of a solitary porokeratotic lesion to the plantar medial aspect of the left arch. There is no surrounding erythema edema cellulitis drainage or odor. Orthopedic evaluation demonstrates relatively normal rectus architecture of the feet.        Assessment & Plan:  Assessment: Soft tissue lesion/porokeratosis left midfoot.  Plan: Debridement of porokeratotic lesion today followup with her in the near future.

## 2013-07-05 ENCOUNTER — Encounter: Payer: Self-pay | Admitting: Internal Medicine

## 2013-07-08 DIAGNOSIS — I10 Essential (primary) hypertension: Secondary | ICD-10-CM | POA: Diagnosis not present

## 2013-07-08 DIAGNOSIS — I059 Rheumatic mitral valve disease, unspecified: Secondary | ICD-10-CM | POA: Diagnosis not present

## 2013-07-29 ENCOUNTER — Encounter: Payer: Self-pay | Admitting: Internal Medicine

## 2013-08-05 ENCOUNTER — Encounter: Payer: Self-pay | Admitting: Internal Medicine

## 2013-08-05 ENCOUNTER — Ambulatory Visit (INDEPENDENT_AMBULATORY_CARE_PROVIDER_SITE_OTHER): Payer: BC Managed Care – PPO | Admitting: Internal Medicine

## 2013-08-05 VITALS — BP 136/70 | HR 64 | Temp 98.0°F | Ht 61.0 in | Wt 126.8 lb

## 2013-08-05 DIAGNOSIS — R140 Abdominal distension (gaseous): Secondary | ICD-10-CM

## 2013-08-05 DIAGNOSIS — R634 Abnormal weight loss: Secondary | ICD-10-CM

## 2013-08-05 DIAGNOSIS — K625 Hemorrhage of anus and rectum: Secondary | ICD-10-CM | POA: Diagnosis not present

## 2013-08-05 DIAGNOSIS — I10 Essential (primary) hypertension: Secondary | ICD-10-CM

## 2013-08-05 DIAGNOSIS — Z860100 Personal history of colon polyps, unspecified: Secondary | ICD-10-CM

## 2013-08-05 DIAGNOSIS — R141 Gas pain: Secondary | ICD-10-CM | POA: Diagnosis not present

## 2013-08-05 DIAGNOSIS — K649 Unspecified hemorrhoids: Secondary | ICD-10-CM

## 2013-08-05 DIAGNOSIS — R143 Flatulence: Secondary | ICD-10-CM

## 2013-08-05 DIAGNOSIS — R142 Eructation: Secondary | ICD-10-CM

## 2013-08-05 DIAGNOSIS — T148XXA Other injury of unspecified body region, initial encounter: Secondary | ICD-10-CM | POA: Diagnosis not present

## 2013-08-05 DIAGNOSIS — H2510 Age-related nuclear cataract, unspecified eye: Secondary | ICD-10-CM | POA: Diagnosis not present

## 2013-08-05 DIAGNOSIS — IMO0002 Reserved for concepts with insufficient information to code with codable children: Secondary | ICD-10-CM

## 2013-08-05 MED ORDER — HYDROCORTISONE ACE-PRAMOXINE 1-1 % RE CREA: 1.0000 "application " | TOPICAL_CREAM | Freq: Two times a day (BID) | RECTAL | Status: DC

## 2013-08-05 MED ORDER — HYDROCODONE-ACETAMINOPHEN 5-325 MG PO TABS: 0.5000 | ORAL_TABLET | Freq: Every day | ORAL | Status: DC | PRN

## 2013-08-05 NOTE — Progress Notes (Signed)
Pre-visit discussion using our clinic review tool. No additional management support is needed unless otherwise documented below in the visit note.  

## 2013-08-06 LAB — CBC WITH DIFFERENTIAL/PLATELET
BASOS PCT: 1.5 % (ref 0.0–3.0)
Basophils Absolute: 0.1 10*3/uL (ref 0.0–0.1)
EOS ABS: 0.1 10*3/uL (ref 0.0–0.7)
Eosinophils Relative: 1 % (ref 0.0–5.0)
HEMATOCRIT: 40.5 % (ref 36.0–46.0)
HEMOGLOBIN: 13.3 g/dL (ref 12.0–15.0)
LYMPHS ABS: 1.9 10*3/uL (ref 0.7–4.0)
Lymphocytes Relative: 32.1 % (ref 12.0–46.0)
MCHC: 32.8 g/dL (ref 30.0–36.0)
MCV: 90.6 fl (ref 78.0–100.0)
MONO ABS: 0.4 10*3/uL (ref 0.1–1.0)
Monocytes Relative: 6.8 % (ref 3.0–12.0)
NEUTROS ABS: 3.6 10*3/uL (ref 1.4–7.7)
Neutrophils Relative %: 58.6 % (ref 43.0–77.0)
Platelets: 188 10*3/uL (ref 150.0–400.0)
RBC: 4.47 Mil/uL (ref 3.87–5.11)
RDW: 13.4 % (ref 11.5–14.6)
WBC: 6.1 10*3/uL (ref 4.5–10.5)

## 2013-08-06 LAB — COMPREHENSIVE METABOLIC PANEL
ALT: 12 U/L (ref 0–35)
AST: 21 U/L (ref 0–37)
Albumin: 3.8 g/dL (ref 3.5–5.2)
Alkaline Phosphatase: 86 U/L (ref 39–117)
BUN: 17 mg/dL (ref 6–23)
CALCIUM: 9.8 mg/dL (ref 8.4–10.5)
CO2: 30 mEq/L (ref 19–32)
CREATININE: 0.7 mg/dL (ref 0.4–1.2)
Chloride: 105 mEq/L (ref 96–112)
GFR: 81.66 mL/min (ref >60.00)
Glucose, Bld: 86 mg/dL (ref 70–99)
Potassium: 4.2 mEq/L (ref 3.5–5.1)
Sodium: 141 mEq/L (ref 135–145)
Total Bilirubin: 0.7 mg/dL (ref 0.3–1.2)
Total Protein: 6.6 g/dL (ref 6.0–8.3)

## 2013-08-07 ENCOUNTER — Encounter: Payer: Self-pay | Admitting: Internal Medicine

## 2013-08-08 ENCOUNTER — Encounter: Payer: Self-pay | Admitting: Internal Medicine

## 2013-08-08 NOTE — Assessment & Plan Note (Signed)
Notice of blood and exam as outlined.  Refer to Dr Tamala Julian for further evaluation and treatment.

## 2013-08-08 NOTE — Assessment & Plan Note (Signed)
Started align.  Symptoms have improved.  Has had recent colonoscopy and gyn exam.  Will follow.  Feels better.   

## 2013-08-08 NOTE — Assessment & Plan Note (Signed)
Weight down a pound a half.  Colonoscopy and gyn w/up as outlined.  States appetite better.  Follow.

## 2013-08-08 NOTE — Assessment & Plan Note (Signed)
Blood pressure under good control.  Same medication regimen.  Follow metabolic panel.   

## 2013-08-08 NOTE — Assessment & Plan Note (Signed)
Back pain.  S/p injections.  Has been followed by Dr Sharlet Salina.  Plans to f/u here now.  Takes 1/2 hydrocodone prn.  Follow.

## 2013-08-08 NOTE — Assessment & Plan Note (Signed)
Last colonoscopy 1/14.  Followed by Dr Oh.    

## 2013-08-08 NOTE — Progress Notes (Signed)
Subjective:    Patient ID: Lisa Blackwell, female    DOB: 17-Jan-1926, 80 y.o.   MRN: [id number]  HPI 80 year old female with past history of compression fractures, hypertension and colon polyps who comes in today for a scheduled follow up.   She is seeing Dr Sharlet Salina for back pain.  Has had thoracic compression fractures.  Previously went to physical therapy.  Helped.  Had reoccurring pain.  Dr Sharlet Salina gave injections.  Helped.  Takes hydrocodone (1/2 tablet q hs prn).  Also using patches.  She reports that she has a good appetite.  No nausea or vomiting.  Having good bowel movements.  Increased gas.  Not as bloated. Taking align.   Weight is down a little more than a pound from last check.  Breathing stable.  Has noticed some BRB per rectum.  Has noticed some blood in her underpants.     Past Medical History  Diagnosis Date  . History of chicken pox   . Heart murmur   . Hypertension   . Compression fracture     Spine  . Hx of colonic polyp     Outpatient Encounter Prescriptions as of 08/05/2013  Medication Sig  . aspirin EC 81 MG tablet Take 81 mg by mouth daily.  . calcium citrate-vitamin D (CITRACAL+D) 315-200 MG-UNIT per tablet Take 1 tablet by mouth daily.  . Cholecalciferol (VITAMIN D) 2000 UNITS tablet Take 2,000 Units by mouth daily.  Marland Kitchen gabapentin (NEURONTIN) 100 MG capsule Take 100 mg by mouth 2 (two) times daily.   Marland Kitchen HYDROcodone-acetaminophen (NORCO/VICODIN) 5-325 MG per tablet Take 0.5 tablets by mouth daily as needed.  Marland Kitchen ipratropium (ATROVENT) 0.03 % nasal spray Place 2 sprays into the nose every 12 (twelve) hours.  . Probiotic Product (ALIGN PO) Take by mouth daily.  . raloxifene (EVISTA) 60 MG tablet Take 60 mg by mouth daily.  Marland Kitchen telmisartan-hydrochlorothiazide (MICARDIS HCT) 40-12.5 MG per tablet Take 0.5 tablets by mouth daily.  . valACYclovir (VALTREX) 500 MG tablet Take 500 mg by mouth 2 (two) times daily as needed.  . [DISCONTINUED] HYDROcodone-acetaminophen  (NORCO/VICODIN) 5-325 MG per tablet Take 0.5 tablets by mouth every 6 (six) hours as needed for pain.  . pramoxine-hydrocortisone (ANALPRAM-HC) 1-1 % rectal cream Place 1 application rectally 2 (two) times daily.    Review of Systems Patient denies any headache, lightheadedness or dizziness.  No sinus or allergy symptoms.  No chest pain, tightness or palpitations.  No increased shortness of breath, cough or congestion.  No nausea or vomiting.  No acid reflux.  No abdominal pain or cramping.  Bloating better.  Weight as outlined.  No bowel change, such as diarrhea, constipation, or melana.  Did notice some BRB as outlined.  States has hemorrhoids.  No urine change.  Back pain.  Seeing Dr Sharlet Salina.  Previous episode of syncope.  Worked up by cardiology.  No further episodes.  Living at [facility].       Objective:   Physical Exam  Filed Vitals:   08/05/13 1511  BP: 136/70  Pulse: 64  Temp: 98 F (42.15 C)   80 year old female in no acute distress.   HEENT:  Nares- clear.  Oropharynx - without lesions. NECK:  Supple.  Nontender.  No audible bruit.  HEART:  Appears to be regular. LUNGS:  No crackles or wheezing audible.  Respirations even and unlabored.  RADIAL PULSE:  Equal bilaterally.   ABDOMEN:  Soft, nontender.  Bowel sounds present and normal.  No audible abdominal bruit.     RECTAL:  Hemorrhoid present.  Appears inflamed.  Heme positive.    EXTREMITIES:  No increased edema present.  DP pulses palpable and equal bilaterally.          Assessment & Plan:  HEALTH MAINTENANCE.  Physical 05/06/13.   Colonoscopy 1/14.  Mammogram 06/10/13 - Birads I.  Last pap 10/10.

## 2013-08-09 ENCOUNTER — Encounter: Payer: Self-pay | Admitting: Emergency Medicine

## 2013-08-09 NOTE — Telephone Encounter (Signed)
Mailed unread MyChart message to pt  

## 2013-08-14 ENCOUNTER — Encounter: Payer: Self-pay | Admitting: Internal Medicine

## 2013-08-18 DIAGNOSIS — K644 Residual hemorrhoidal skin tags: Secondary | ICD-10-CM | POA: Diagnosis not present

## 2013-08-18 DIAGNOSIS — K648 Other hemorrhoids: Secondary | ICD-10-CM | POA: Diagnosis not present

## 2013-08-22 HISTORY — PX: HEMORRHOID BANDING: SHX5850

## 2013-08-30 DIAGNOSIS — K648 Other hemorrhoids: Secondary | ICD-10-CM | POA: Diagnosis not present

## 2013-09-02 DIAGNOSIS — L821 Other seborrheic keratosis: Secondary | ICD-10-CM | POA: Diagnosis not present

## 2013-09-02 DIAGNOSIS — D1800 Hemangioma unspecified site: Secondary | ICD-10-CM | POA: Diagnosis not present

## 2013-09-02 DIAGNOSIS — Z85828 Personal history of other malignant neoplasm of skin: Secondary | ICD-10-CM | POA: Diagnosis not present

## 2013-10-04 DIAGNOSIS — K648 Other hemorrhoids: Secondary | ICD-10-CM | POA: Diagnosis not present

## 2013-10-06 ENCOUNTER — Encounter: Payer: Self-pay | Admitting: *Deleted

## 2013-12-06 ENCOUNTER — Ambulatory Visit: Payer: Medicare Other | Admitting: Internal Medicine

## 2013-12-14 ENCOUNTER — Ambulatory Visit (INDEPENDENT_AMBULATORY_CARE_PROVIDER_SITE_OTHER): Payer: Medicare Other | Admitting: Internal Medicine

## 2013-12-14 ENCOUNTER — Encounter: Payer: Self-pay | Admitting: Internal Medicine

## 2013-12-14 VITALS — HR 61 | Temp 98.4°F | Ht 61.0 in | Wt 131.5 lb

## 2013-12-14 DIAGNOSIS — T148XXA Other injury of unspecified body region, initial encounter: Secondary | ICD-10-CM | POA: Diagnosis not present

## 2013-12-14 DIAGNOSIS — IMO0002 Reserved for concepts with insufficient information to code with codable children: Secondary | ICD-10-CM

## 2013-12-14 DIAGNOSIS — K649 Unspecified hemorrhoids: Secondary | ICD-10-CM

## 2013-12-14 DIAGNOSIS — R141 Gas pain: Secondary | ICD-10-CM | POA: Diagnosis not present

## 2013-12-14 DIAGNOSIS — R142 Eructation: Secondary | ICD-10-CM

## 2013-12-14 DIAGNOSIS — R252 Cramp and spasm: Secondary | ICD-10-CM

## 2013-12-14 DIAGNOSIS — R143 Flatulence: Secondary | ICD-10-CM

## 2013-12-14 DIAGNOSIS — I10 Essential (primary) hypertension: Secondary | ICD-10-CM

## 2013-12-14 DIAGNOSIS — M545 Low back pain, unspecified: Secondary | ICD-10-CM

## 2013-12-14 DIAGNOSIS — R634 Abnormal weight loss: Secondary | ICD-10-CM

## 2013-12-14 DIAGNOSIS — Z86010 Personal history of colonic polyps: Secondary | ICD-10-CM

## 2013-12-14 DIAGNOSIS — K648 Other hemorrhoids: Secondary | ICD-10-CM

## 2013-12-14 DIAGNOSIS — R140 Abdominal distension (gaseous): Secondary | ICD-10-CM

## 2013-12-14 LAB — COMPREHENSIVE METABOLIC PANEL
ALBUMIN: 3.5 g/dL (ref 3.5–5.2)
ALT: 13 U/L (ref 0–35)
AST: 23 U/L (ref 0–37)
Alkaline Phosphatase: 69 U/L (ref 39–117)
BUN: 24 mg/dL — ABNORMAL HIGH (ref 6–23)
CHLORIDE: 106 meq/L (ref 96–112)
CO2: 31 mEq/L (ref 19–32)
Calcium: 9.4 mg/dL (ref 8.4–10.5)
Creatinine, Ser: 0.7 mg/dL (ref 0.4–1.2)
GFR: 81.59 mL/min (ref >60.00)
Glucose, Bld: 91 mg/dL (ref 70–99)
POTASSIUM: 4.4 meq/L (ref 3.5–5.1)
Sodium: 140 mEq/L (ref 135–145)
Total Bilirubin: 0.6 mg/dL (ref 0.2–1.2)
Total Protein: 6.1 g/dL (ref 6.0–8.3)

## 2013-12-14 LAB — CBC WITH DIFFERENTIAL/PLATELET
Basophils Absolute: 0 10*3/uL (ref 0.0–0.1)
Basophils Relative: 0.7 % (ref 0.0–3.0)
Eosinophils Absolute: 0 10*3/uL (ref 0.0–0.7)
Eosinophils Relative: 0.8 % (ref 0.0–5.0)
HCT: 38 % (ref 36.0–46.0)
Hemoglobin: 12.6 g/dL (ref 12.0–15.0)
Lymphocytes Relative: 34.3 % (ref 12.0–46.0)
Lymphs Abs: 1.6 10*3/uL (ref 0.7–4.0)
MCHC: 33.1 g/dL (ref 30.0–36.0)
MCV: 89.7 fl (ref 78.0–100.0)
MONO ABS: 0.3 10*3/uL (ref 0.1–1.0)
Monocytes Relative: 7.1 % (ref 3.0–12.0)
Neutro Abs: 2.7 10*3/uL (ref 1.4–7.7)
Neutrophils Relative %: 57.1 % (ref 43.0–77.0)
PLATELETS: 165 10*3/uL (ref 150.0–400.0)
RBC: 4.24 Mil/uL (ref 3.87–5.11)
RDW: 14.1 % (ref 11.5–15.5)
WBC: 4.8 10*3/uL (ref 4.0–10.5)

## 2013-12-14 LAB — MAGNESIUM: Magnesium: 2.2 mg/dL (ref 1.5–2.5)

## 2013-12-14 NOTE — Progress Notes (Signed)
Pre visit review using our clinic review tool, if applicable. No additional management support is needed unless otherwise documented below in the visit note. 

## 2013-12-15 ENCOUNTER — Encounter: Payer: Self-pay | Admitting: Internal Medicine

## 2013-12-19 ENCOUNTER — Encounter: Payer: Self-pay | Admitting: Internal Medicine

## 2013-12-19 NOTE — Assessment & Plan Note (Signed)
Stay hydrated.  Stretches and exercise as outlined.  Check potassium, calcium and magnesium.  Follow.

## 2013-12-19 NOTE — Assessment & Plan Note (Signed)
Blood pressure under good control.  Same medication regimen.  Follow metabolic panel.   

## 2013-12-19 NOTE — Assessment & Plan Note (Signed)
Started align.  Symptoms have improved.  Has had recent colonoscopy and gyn exam.  Will follow.  Feels better.

## 2013-12-19 NOTE — Assessment & Plan Note (Signed)
Weight up several pounds from last check.  Colonoscopy and gyn w/up as outlined.  States appetite better.  Follow.

## 2013-12-19 NOTE — Assessment & Plan Note (Signed)
Saw Dr Tamala Julian.  Is s/p rubber band ligation.  Doing well.  No bleeding.  Follow.

## 2013-12-19 NOTE — Assessment & Plan Note (Signed)
Back pain.  S/p injections.  Has been followed by Dr Sharlet Salina.  Plans to f/u here now.  Takes 1/2 hydrocodone prn.  Follow.  Would like to do more physical therapy.  Helped previously.  Will arrange to be done at [facility].

## 2013-12-19 NOTE — Progress Notes (Signed)
Subjective:    Patient ID: Lisa Blackwell, female    DOB: 1925-12-18, 80 y.o.   MRN: [id number]  HPI 80 year old female with past history of compression fractures, hypertension and colon polyps who comes in today for a scheduled follow up.   She is seeing Dr Sharlet Salina for back pain.  Has had thoracic compression fractures.  Previously went to physical therapy.  Helped.  Had reoccurring pain.  Dr Sharlet Salina gave injections.  Helped.  Takes hydrocodone (1/2 tablet q hs prn).  Also using patches.  Given persistent pain, she would like to return to physical therapy.  She reports that she has a good appetite.  No nausea or vomiting.  Having good bowel movements.  States on average has five bowel movements per week.   No blood.  Not as bloated. Taking align.   Weight up a few pounds from last check.   Breathing stable.  She does report noticing some intermittent cramps.  We discussed stretches and staying hydrated.     Past Medical History  Diagnosis Date  . History of chicken pox   . Heart murmur   . Hypertension   . Compression fracture     Spine  . Hx of colonic polyp   . Internal hemorrhoid     Outpatient Encounter Prescriptions as of 12/14/2013  Medication Sig  . aspirin EC 81 MG tablet Take 81 mg by mouth daily.  . calcium citrate-vitamin D (CITRACAL+D) 315-200 MG-UNIT per tablet Take 1 tablet by mouth daily.  . Cholecalciferol (VITAMIN D) 2000 UNITS tablet Take 2,000 Units by mouth daily.  Marland Kitchen gabapentin (NEURONTIN) 100 MG capsule Take 100 mg by mouth 2 (two) times daily.   Marland Kitchen HYDROcodone-acetaminophen (NORCO/VICODIN) 5-325 MG per tablet Take 0.5 tablets by mouth daily as needed.  Marland Kitchen ipratropium (ATROVENT) 0.03 % nasal spray Place 2 sprays into the nose every 12 (twelve) hours.  . Probiotic Product (ALIGN PO) Take by mouth daily.  . raloxifene (EVISTA) 60 MG tablet Take 60 mg by mouth daily.  Marland Kitchen telmisartan-hydrochlorothiazide (MICARDIS HCT) 40-12.5 MG per tablet Take 0.5 tablets by mouth  daily.  . valACYclovir (VALTREX) 500 MG tablet Take 500 mg by mouth 2 (two) times daily as needed.  . [DISCONTINUED] pramoxine-hydrocortisone (ANALPRAM-HC) 1-1 % rectal cream Place 1 application rectally 2 (two) times daily.    Review of Systems Patient denies any headache, lightheadedness or dizziness.  No sinus or allergy symptoms.  No chest pain, tightness or palpitations.  No increased shortness of breath, cough or congestion.  No nausea or vomiting.  No acid reflux.  No abdominal pain or cramping.  Bloating better.  Weight as outlined.  Improved.  Appetite improved.  No bowel change, such as diarrhea, constipation, or melana.  No urine change.  Back pain.  Seeing Dr Sharlet Salina.  Previous episode of syncope.  Worked up by cardiology.  No further episodes.  Living at [facility].  Would like to do physical therapy through [facility].  Some cramping as outlined.       Objective:   Physical Exam  Filed Vitals:   12/14/13 0909  Pulse: 61  Temp: 98.4 F (36.9 C)   Blood pressure recheck:  3/45  80 year old female in no acute distress.   HEENT:  Nares- clear.  Oropharynx - without lesions. NECK:  Supple.  Nontender.  No audible bruit.  HEART:  Appears to be regular. LUNGS:  No crackles or wheezing audible.  Respirations even and unlabored.  RADIAL PULSE:  Equal bilaterally.   ABDOMEN:  Soft, nontender.  Bowel sounds present and normal.  No audible abdominal bruit.    EXTREMITIES:  No increased edema present.  DP pulses palpable and equal bilaterally.          Assessment & Plan:  HEALTH MAINTENANCE.  Physical 05/06/13.   Colonoscopy 1/14.  Mammogram 06/10/13 - Birads I.  Last pap 10/10.

## 2013-12-19 NOTE — Assessment & Plan Note (Signed)
Last colonoscopy 1/14.  Followed by Dr Candace Cruise.

## 2014-01-06 DIAGNOSIS — I10 Essential (primary) hypertension: Secondary | ICD-10-CM | POA: Diagnosis not present

## 2014-01-06 DIAGNOSIS — I359 Nonrheumatic aortic valve disorder, unspecified: Secondary | ICD-10-CM | POA: Diagnosis not present

## 2014-01-06 DIAGNOSIS — I519 Heart disease, unspecified: Secondary | ICD-10-CM | POA: Diagnosis not present

## 2014-01-06 DIAGNOSIS — K219 Gastro-esophageal reflux disease without esophagitis: Secondary | ICD-10-CM | POA: Diagnosis not present

## 2014-02-03 ENCOUNTER — Encounter: Payer: Self-pay | Admitting: Internal Medicine

## 2014-02-03 DIAGNOSIS — R140 Abdominal distension (gaseous): Secondary | ICD-10-CM

## 2014-02-04 NOTE — Telephone Encounter (Signed)
Order placed for gyn referral.  Pt notified via my chart.

## 2014-02-16 DIAGNOSIS — R141 Gas pain: Secondary | ICD-10-CM | POA: Diagnosis not present

## 2014-02-16 DIAGNOSIS — R634 Abnormal weight loss: Secondary | ICD-10-CM | POA: Diagnosis not present

## 2014-02-16 DIAGNOSIS — R143 Flatulence: Secondary | ICD-10-CM | POA: Diagnosis not present

## 2014-02-17 ENCOUNTER — Encounter: Payer: Self-pay | Admitting: Internal Medicine

## 2014-02-17 ENCOUNTER — Ambulatory Visit (INDEPENDENT_AMBULATORY_CARE_PROVIDER_SITE_OTHER): Payer: BC Managed Care – PPO | Admitting: Internal Medicine

## 2014-02-17 VITALS — BP 128/64 | HR 58 | Temp 97.8°F | Resp 16 | Ht 61.0 in | Wt 129.5 lb

## 2014-02-17 DIAGNOSIS — IMO0002 Reserved for concepts with insufficient information to code with codable children: Secondary | ICD-10-CM

## 2014-02-17 MED ORDER — CEPHALEXIN 500 MG PO CAPS: 500.0000 mg | ORAL_CAPSULE | Freq: Four times a day (QID) | ORAL | Status: DC

## 2014-02-17 MED ORDER — CEPHALEXIN 500 MG PO CAPS: 500.0000 mg | ORAL_CAPSULE | Freq: Three times a day (TID) | ORAL | Status: DC

## 2014-02-17 NOTE — Patient Instructions (Signed)
I an prescribing keflex , an antibiotic for the skin changes that may be due to an early infection from the bug bite.  Take the antibiotic with food,. And continue the align as your probiotic   You can take benadryl (dipehnydramine) for the itching 25 mg every 8 hours as needed

## 2014-02-17 NOTE — Progress Notes (Signed)
Pre-visit discussion using our clinic review tool. No additional management support is needed unless otherwise documented below in the visit note.  

## 2014-02-20 ENCOUNTER — Encounter: Payer: Self-pay | Admitting: Internal Medicine

## 2014-02-20 NOTE — Assessment & Plan Note (Addendum)
She has a mild localized reaction to insect bite which occurred two weeks ago.  Will treat empirically with cephalexin x 7 days .  She is up to date on Td vaccination

## 2014-02-20 NOTE — Progress Notes (Signed)
Patient ID: Lisa Blackwell, female   DOB: [birth year], 80 y.o.   MRN: [id number]   Patient Active Problem List   Diagnosis Date Noted  . Cellulitis and abscess of upper arm and forearm 02/17/2014  . Leg cramps 12/14/2013  . Hemorrhoid 05/09/2013  . Compression fracture 03/20/2013  . Bloating 03/20/2013  . Herpes genitalis in women 03/20/2013  . Loss of weight 03/20/2013  . Essential hypertension, benign 03/20/2013  . Personal history of colonic polyps 03/20/2013    Subjective:  CC:   Chief Complaint  Patient presents with  . Acute Visit    Patient thinks was an insect bite that happened on the 15 th first week the bite area diid not hurt now painful and redness around bite is spreading.  . Insect Bite    inner left arm above elbow. painful at night does not itch.    HPI:   Lisa Blackwell is a 80 y.o. female who presents for evaluation of a Painful reddened area on upper foreamr of left arm,  Has been present since she received an insect bite on the 15th of august.  Patient recalls pulling a long shirt from her closet  And when she pulled it over her arm felt a bit and then something flew off.  Saw a welt initially,  No stinger,  No itching .    Past Medical History  Diagnosis Date  . History of chicken pox   . Heart murmur   . Hypertension   . Compression fracture     Spine  . Hx of colonic polyp   . Internal hemorrhoid     Past Surgical History  Procedure Laterality Date  . Appendectomy  1957  . Hemorrhoid banding  March 2015    interal rubber band ligation       The following portions of the patient's history were reviewed and updated as appropriate: Allergies, current medications, and problem list.    Review of Systems:   Patient denies headache, fevers, malaise, unintentional weight loss, skin rash, eye pain, sinus congestion and sinus pain, sore throat, dysphagia,  hemoptysis , cough, dyspnea, wheezing, chest pain, palpitations, orthopnea, edema,  abdominal pain, nausea, melena, diarrhea, constipation, flank pain, dysuria, hematuria, urinary  Frequency, nocturia, numbness, tingling, seizures,  Focal weakness, Loss of consciousness,  Tremor, insomnia, depression, anxiety, and suicidal ideation.     History   Social History  . Marital Status: Married    Spouse Name: N/A    Number of Children: 1  . Years of Education: N/A   Occupational History  . Not on file.   Social History Main Topics  . Smoking status: Never Smoker   . Smokeless tobacco: Never Used  . Alcohol Use: No  . Drug Use: No  . Sexual Activity: Not on file   Other Topics Concern  . Not on file   Social History Narrative  . No narrative on file    Objective:  Filed Vitals:   02/17/14 1144  BP: 128/64  Pulse: 58  Temp: 97.8 F (36.6 C)  Resp: 16     General appearance: alert, cooperative and appears stated age Ears: normal TM's and external ear canals both ears Throat: lips, mucosa, and tongue normal; teeth and gums normal Neck: no adenopathy, no carotid bruit, supple, symmetrical, trachea midline and thyroid not enlarged, symmetric, no tenderness/mass/nodules Back: symmetric, no curvature. ROM normal. No CVA tenderness. Lungs: clear to auscultation bilaterally Heart: regular rate and rhythm, S1, S2 normal, no  murmur, click, rub or gallop Abdomen: soft, non-tender; bowel sounds normal; no masses,  no organomegaly Pulses: 2+ and symmetric Skin: mildly  tender erythematous maculopapular area on inner aspect of left upper arm Lymph nodes: Cervical, supraclavicular, and axillary nodes normal.  Assessment and Plan:  Cellulitis and abscess of upper arm and forearm She has a mild localized reaction to insect bite which occurred two weeks ago.  Will treat empirically with cephalexin x 7 days .  She is up to date on Td vaccination     Updated Medication List Outpatient Encounter Prescriptions as of 02/17/2014  Medication Sig  . aspirin EC 81 MG  tablet Take 81 mg by mouth daily.  . calcium citrate-vitamin D (CITRACAL+D) 315-200 MG-UNIT per tablet Take 1 tablet by mouth daily.  . Cholecalciferol (VITAMIN D) 2000 UNITS tablet Take 2,000 Units by mouth daily.  Marland Kitchen gabapentin (NEURONTIN) 100 MG capsule Take 100 mg by mouth 2 (two) times daily.   Marland Kitchen HYDROcodone-acetaminophen (NORCO/VICODIN) 5-325 MG per tablet Take 0.5 tablets by mouth daily as needed.  Marland Kitchen ipratropium (ATROVENT) 0.03 % nasal spray Place 2 sprays into the nose every 12 (twelve) hours.  . Probiotic Product (ALIGN PO) Take by mouth daily.  . raloxifene (EVISTA) 60 MG tablet Take 60 mg by mouth daily.  Marland Kitchen telmisartan-hydrochlorothiazide (MICARDIS HCT) 40-12.5 MG per tablet Take 0.5 tablets by mouth daily.  . valACYclovir (VALTREX) 500 MG tablet Take 500 mg by mouth 2 (two) times daily as needed.  . cephALEXin (KEFLEX) 500 MG capsule Take 1 capsule (500 mg total) by mouth 3 (three) times daily.  . [DISCONTINUED] cephALEXin (KEFLEX) 500 MG capsule Take 1 capsule (500 mg total) by mouth 4 (four) times daily.     No orders of the defined types were placed in this encounter.    No Follow-up on file.

## 2014-02-21 ENCOUNTER — Encounter: Payer: Self-pay | Admitting: Internal Medicine

## 2014-02-24 ENCOUNTER — Encounter: Payer: Self-pay | Admitting: Internal Medicine

## 2014-02-28 ENCOUNTER — Encounter: Payer: Self-pay | Admitting: Internal Medicine

## 2014-03-02 DIAGNOSIS — M546 Pain in thoracic spine: Secondary | ICD-10-CM | POA: Diagnosis not present

## 2014-03-02 DIAGNOSIS — M6281 Muscle weakness (generalized): Secondary | ICD-10-CM | POA: Diagnosis not present

## 2014-03-03 DIAGNOSIS — M6281 Muscle weakness (generalized): Secondary | ICD-10-CM | POA: Diagnosis not present

## 2014-03-03 DIAGNOSIS — M546 Pain in thoracic spine: Secondary | ICD-10-CM | POA: Diagnosis not present

## 2014-03-07 DIAGNOSIS — M6281 Muscle weakness (generalized): Secondary | ICD-10-CM | POA: Diagnosis not present

## 2014-03-07 DIAGNOSIS — M546 Pain in thoracic spine: Secondary | ICD-10-CM | POA: Diagnosis not present

## 2014-03-08 DIAGNOSIS — M546 Pain in thoracic spine: Secondary | ICD-10-CM | POA: Diagnosis not present

## 2014-03-08 DIAGNOSIS — M6281 Muscle weakness (generalized): Secondary | ICD-10-CM | POA: Diagnosis not present

## 2014-03-10 DIAGNOSIS — M546 Pain in thoracic spine: Secondary | ICD-10-CM | POA: Diagnosis not present

## 2014-03-10 DIAGNOSIS — M6281 Muscle weakness (generalized): Secondary | ICD-10-CM | POA: Diagnosis not present

## 2014-03-14 ENCOUNTER — Other Ambulatory Visit: Payer: Self-pay | Admitting: *Deleted

## 2014-03-14 DIAGNOSIS — M6281 Muscle weakness (generalized): Secondary | ICD-10-CM | POA: Diagnosis not present

## 2014-03-14 DIAGNOSIS — M546 Pain in thoracic spine: Secondary | ICD-10-CM | POA: Diagnosis not present

## 2014-03-14 MED ORDER — RALOXIFENE HCL 60 MG PO TABS: 60.0000 mg | ORAL_TABLET | Freq: Every day | ORAL | Status: DC

## 2014-03-15 DIAGNOSIS — M6281 Muscle weakness (generalized): Secondary | ICD-10-CM | POA: Diagnosis not present

## 2014-03-15 DIAGNOSIS — M546 Pain in thoracic spine: Secondary | ICD-10-CM | POA: Diagnosis not present

## 2014-03-17 ENCOUNTER — Ambulatory Visit (INDEPENDENT_AMBULATORY_CARE_PROVIDER_SITE_OTHER): Payer: Medicare Other | Admitting: Podiatry

## 2014-03-17 ENCOUNTER — Encounter: Payer: Self-pay | Admitting: Podiatry

## 2014-03-17 DIAGNOSIS — D2370 Other benign neoplasm of skin of unspecified lower limb, including hip: Secondary | ICD-10-CM | POA: Diagnosis not present

## 2014-03-17 DIAGNOSIS — M546 Pain in thoracic spine: Secondary | ICD-10-CM | POA: Diagnosis not present

## 2014-03-17 DIAGNOSIS — M6281 Muscle weakness (generalized): Secondary | ICD-10-CM | POA: Diagnosis not present

## 2014-03-17 DIAGNOSIS — L851 Acquired keratosis [keratoderma] palmaris et plantaris: Secondary | ICD-10-CM

## 2014-03-18 ENCOUNTER — Encounter: Payer: Self-pay | Admitting: Internal Medicine

## 2014-03-18 ENCOUNTER — Ambulatory Visit (INDEPENDENT_AMBULATORY_CARE_PROVIDER_SITE_OTHER): Payer: Medicare Other | Admitting: Internal Medicine

## 2014-03-18 VITALS — BP 124/62 | HR 78 | Temp 98.2°F | Resp 16 | Wt 130.0 lb

## 2014-03-18 DIAGNOSIS — IMO0002 Reserved for concepts with insufficient information to code with codable children: Secondary | ICD-10-CM

## 2014-03-18 DIAGNOSIS — R141 Gas pain: Secondary | ICD-10-CM

## 2014-03-18 DIAGNOSIS — R252 Cramp and spasm: Secondary | ICD-10-CM | POA: Diagnosis not present

## 2014-03-18 DIAGNOSIS — Z23 Encounter for immunization: Secondary | ICD-10-CM

## 2014-03-18 DIAGNOSIS — M810 Age-related osteoporosis without current pathological fracture: Secondary | ICD-10-CM | POA: Insufficient documentation

## 2014-03-18 DIAGNOSIS — R140 Abdominal distension (gaseous): Secondary | ICD-10-CM

## 2014-03-18 DIAGNOSIS — R142 Eructation: Secondary | ICD-10-CM

## 2014-03-18 DIAGNOSIS — R634 Abnormal weight loss: Secondary | ICD-10-CM

## 2014-03-18 DIAGNOSIS — R143 Flatulence: Secondary | ICD-10-CM

## 2014-03-18 DIAGNOSIS — I10 Essential (primary) hypertension: Secondary | ICD-10-CM | POA: Diagnosis not present

## 2014-03-18 DIAGNOSIS — Z86010 Personal history of colonic polyps: Secondary | ICD-10-CM

## 2014-03-18 LAB — COMPREHENSIVE METABOLIC PANEL
ALBUMIN: 3.6 g/dL (ref 3.5–5.2)
ALK PHOS: 73 U/L (ref 39–117)
ALT: 10 U/L (ref 0–35)
AST: 20 U/L (ref 0–37)
BUN: 21 mg/dL (ref 6–23)
CALCIUM: 9.5 mg/dL (ref 8.4–10.5)
CO2: 31 meq/L (ref 19–32)
Chloride: 104 mEq/L (ref 96–112)
Creatinine, Ser: 0.8 mg/dL (ref 0.4–1.2)
GFR: 71.18 mL/min (ref >60.00)
GLUCOSE: 73 mg/dL (ref 70–99)
POTASSIUM: 4.5 meq/L (ref 3.5–5.1)
Sodium: 138 mEq/L (ref 135–145)
TOTAL PROTEIN: 6.1 g/dL (ref 6.0–8.3)
Total Bilirubin: 0.6 mg/dL (ref 0.2–1.2)

## 2014-03-18 LAB — TSH: TSH: 1.79 u[IU]/mL (ref 0.35–4.50)

## 2014-03-18 LAB — MAGNESIUM: MAGNESIUM: 2.2 mg/dL (ref 1.5–2.5)

## 2014-03-18 LAB — VITAMIN D 25 HYDROXY (VIT D DEFICIENCY, FRACTURES): VITD: 63.28 ng/mL (ref 30.00–100.00)

## 2014-03-18 NOTE — Progress Notes (Signed)
Pre visit review using our clinic review tool, if applicable. No additional management support is needed unless otherwise documented below in the visit note. 

## 2014-03-18 NOTE — Assessment & Plan Note (Signed)
Last bone density 12.14 - osteoporosis.  Check vitamin D level.

## 2014-03-18 NOTE — Progress Notes (Signed)
She presents in with a chief complaint painful lesion plantar aspect of her left heel.  Objective: Pulses are stable she is alert and oriented x3. She has pain on palpation. Porokeratotic lesion plantar aspect of the left heel.  Assessment: Porokeratosis left heel.  Plan: Mechanical and chemical debridement of this porokeratotic lesion. Salicylic acid was placed under occlusion and both oral and written home-going instructions for the care of this worse applied I will followup with her in a few weeks.

## 2014-03-20 ENCOUNTER — Encounter: Payer: Self-pay | Admitting: Internal Medicine

## 2014-03-20 NOTE — Assessment & Plan Note (Signed)
Blood pressure under good control.  Same medication regimen.  Follow metabolic panel.   

## 2014-03-20 NOTE — Assessment & Plan Note (Signed)
Last colonoscopy 1/14.  Followed by Dr Candace Cruise.

## 2014-03-20 NOTE — Progress Notes (Signed)
Subjective:    Patient ID: Lisa Blackwell, female    DOB: 12-Feb-1926, 80 y.o.   MRN: [id number]  HPI 80 year old female with past history of compression fractures, hypertension and colon polyps who comes in today for a scheduled follow up.   She is seeing Dr Sharlet Salina for back pain.  Has had thoracic compression fractures.  Previously went to physical therapy.  Helped.  Had reoccurring pain.  Dr Sharlet Salina gave injections.  Helped.  Takes hydrocodone (1/2 tablet q hs prn).  Also using patches.  Went to physical therapy.  Worked on core strengthening.  Still doing exercises.  She reports that she has a good appetite.  No nausea or vomiting.  Having good bowel movements.  No blood.  Not as bloated. Taking align.   Weight overall relatively stable. Down 1.8 pounds from last check, but up from previous.   Breathing stable.  She does report noticing some intermittent cramps.  We discussed stretches and staying hydrated.  Previous bite/cellulitis resolved.     Past Medical History  Diagnosis Date  . History of chicken pox   . Heart murmur   . Hypertension   . Compression fracture     Spine  . Hx of colonic polyp   . Internal hemorrhoid     Outpatient Encounter Prescriptions as of 03/18/2014  Medication Sig  . aspirin EC 81 MG tablet Take 81 mg by mouth daily.  . calcium citrate-vitamin D (CITRACAL+D) 315-200 MG-UNIT per tablet Take 1 tablet by mouth daily.  . Cholecalciferol (VITAMIN D) 2000 UNITS tablet Take 2,000 Units by mouth daily.  Marland Kitchen HYDROcodone-acetaminophen (NORCO/VICODIN) 5-325 MG per tablet Take 0.5 tablets by mouth daily as needed.  Marland Kitchen ipratropium (ATROVENT) 0.03 % nasal spray Place 2 sprays into the nose every 12 (twelve) hours.  . Probiotic Product (ALIGN PO) Take by mouth daily.  . raloxifene (EVISTA) 60 MG tablet Take 1 tablet (60 mg total) by mouth daily.  Marland Kitchen telmisartan-hydrochlorothiazide (MICARDIS HCT) 40-12.5 MG per tablet Take 0.5 tablets by mouth daily.  . valACYclovir  (VALTREX) 500 MG tablet Take 500 mg by mouth 2 (two) times daily as needed.  . [DISCONTINUED] gabapentin (NEURONTIN) 100 MG capsule Take 100 mg by mouth 2 (two) times daily.   . [DISCONTINUED] cephALEXin (KEFLEX) 500 MG capsule Take 1 capsule (500 mg total) by mouth 3 (three) times daily.    Review of Systems Patient denies any headache, lightheadedness or dizziness.  No sinus or allergy symptoms.  No chest pain, tightness or palpitations.  No increased shortness of breath, cough or congestion.  No nausea or vomiting.  No acid reflux.  No abdominal pain or cramping.  Bloating better.  Weight as outlined.  Overall stable.  Appetite improved.  No bowel change, such as diarrhea, constipation, or melana.  No urine change.  Back pain.  Seeing Dr Sharlet Salina.  Previous episode of syncope.  Worked up by cardiology.  No further episodes.  Living at [facility].  Did physical therapy for core strengthening.    Some cramping as outlined.       Objective:   Physical Exam  Filed Vitals:   03/18/14 0856  BP: 124/62  Pulse: 78  Temp: 98.2 F (36.8 C)  Resp: 16   Blood pressure recheck:  26/23  80 year old female in no acute distress.   HEENT:  Nares- clear.  Oropharynx - without lesions. NECK:  Supple.  Nontender.  No audible bruit.  HEART:  Appears to be  regular. LUNGS:  No crackles or wheezing audible.  Respirations even and unlabored.  RADIAL PULSE:  Equal bilaterally.   ABDOMEN:  Soft, nontender.  Bowel sounds present and normal.  No audible abdominal bruit.    EXTREMITIES:  No increased edema present.  DP pulses palpable and equal bilaterally.          Assessment & Plan:  HEALTH MAINTENANCE.  Physical 05/06/13.   Colonoscopy 1/14.  Mammogram 06/10/13 - Birads I.  Last pap 10/10.      I spent 25 minutes with the patient and more than 50% of the time was spent in consultation regarding the above.

## 2014-03-20 NOTE — Assessment & Plan Note (Signed)
Resolved

## 2014-03-20 NOTE — Assessment & Plan Note (Signed)
Stay hydrated.  Stretches and exercise as outlined.  Check potassium, calcium and magnesium.  Follow.  Discussed treatment, i.e., magnesium, tonic water, etc.  Follow.

## 2014-03-20 NOTE — Assessment & Plan Note (Signed)
Back pain.  S/p injections.  Has been followed by Dr Sharlet Salina.  Plans to f/u here now.  Takes 1/2 hydrocodone prn.  Had physical therapy.  Core strengthening.  Doing better.  Follow.

## 2014-03-20 NOTE — Assessment & Plan Note (Signed)
Colonoscopy and gyn w/up as outlined.  States appetite better.  Follow.  Weight overall stable.  Follow.

## 2014-03-20 NOTE — Assessment & Plan Note (Signed)
Symptoms better.  Saw GYN. States everything checked out fine.

## 2014-03-21 DIAGNOSIS — M6281 Muscle weakness (generalized): Secondary | ICD-10-CM | POA: Diagnosis not present

## 2014-03-21 DIAGNOSIS — M546 Pain in thoracic spine: Secondary | ICD-10-CM | POA: Diagnosis not present

## 2014-03-22 DIAGNOSIS — M546 Pain in thoracic spine: Secondary | ICD-10-CM | POA: Diagnosis not present

## 2014-03-22 DIAGNOSIS — M6281 Muscle weakness (generalized): Secondary | ICD-10-CM | POA: Diagnosis not present

## 2014-03-24 DIAGNOSIS — M6281 Muscle weakness (generalized): Secondary | ICD-10-CM | POA: Diagnosis not present

## 2014-03-24 DIAGNOSIS — M546 Pain in thoracic spine: Secondary | ICD-10-CM | POA: Diagnosis not present

## 2014-03-24 DIAGNOSIS — M545 Low back pain: Secondary | ICD-10-CM | POA: Diagnosis not present

## 2014-03-28 DIAGNOSIS — M546 Pain in thoracic spine: Secondary | ICD-10-CM | POA: Diagnosis not present

## 2014-03-28 DIAGNOSIS — M545 Low back pain: Secondary | ICD-10-CM | POA: Diagnosis not present

## 2014-03-28 DIAGNOSIS — M6281 Muscle weakness (generalized): Secondary | ICD-10-CM | POA: Diagnosis not present

## 2014-03-30 DIAGNOSIS — M546 Pain in thoracic spine: Secondary | ICD-10-CM | POA: Diagnosis not present

## 2014-03-30 DIAGNOSIS — M545 Low back pain: Secondary | ICD-10-CM | POA: Diagnosis not present

## 2014-03-30 DIAGNOSIS — M6281 Muscle weakness (generalized): Secondary | ICD-10-CM | POA: Diagnosis not present

## 2014-03-31 DIAGNOSIS — M6281 Muscle weakness (generalized): Secondary | ICD-10-CM | POA: Diagnosis not present

## 2014-03-31 DIAGNOSIS — M546 Pain in thoracic spine: Secondary | ICD-10-CM | POA: Diagnosis not present

## 2014-03-31 DIAGNOSIS — M545 Low back pain: Secondary | ICD-10-CM | POA: Diagnosis not present

## 2014-06-21 ENCOUNTER — Encounter: Payer: Medicare Other | Admitting: Internal Medicine

## 2014-07-07 DIAGNOSIS — I340 Nonrheumatic mitral (valve) insufficiency: Secondary | ICD-10-CM | POA: Diagnosis not present

## 2014-07-07 DIAGNOSIS — E782 Mixed hyperlipidemia: Secondary | ICD-10-CM | POA: Diagnosis not present

## 2014-07-07 DIAGNOSIS — I10 Essential (primary) hypertension: Secondary | ICD-10-CM | POA: Diagnosis not present

## 2014-07-07 DIAGNOSIS — I429 Cardiomyopathy, unspecified: Secondary | ICD-10-CM | POA: Diagnosis not present

## 2014-07-14 ENCOUNTER — Ambulatory Visit: Payer: Self-pay | Admitting: Internal Medicine

## 2014-07-14 DIAGNOSIS — Z1231 Encounter for screening mammogram for malignant neoplasm of breast: Secondary | ICD-10-CM | POA: Diagnosis not present

## 2014-07-14 LAB — HM MAMMOGRAPHY: HM Mammogram: NEGATIVE

## 2014-07-22 ENCOUNTER — Encounter: Payer: Self-pay | Admitting: Internal Medicine

## 2014-07-28 ENCOUNTER — Encounter: Payer: Self-pay | Admitting: Internal Medicine

## 2014-08-09 ENCOUNTER — Ambulatory Visit (INDEPENDENT_AMBULATORY_CARE_PROVIDER_SITE_OTHER): Payer: Medicare Other | Admitting: Internal Medicine

## 2014-08-09 ENCOUNTER — Encounter: Payer: Self-pay | Admitting: Internal Medicine

## 2014-08-09 VITALS — BP 110/80 | HR 65 | Temp 98.2°F | Ht 62.0 in | Wt 131.2 lb

## 2014-08-09 DIAGNOSIS — Z0000 Encounter for general adult medical examination without abnormal findings: Secondary | ICD-10-CM | POA: Insufficient documentation

## 2014-08-09 DIAGNOSIS — Z86010 Personal history of colonic polyps: Secondary | ICD-10-CM

## 2014-08-09 DIAGNOSIS — R140 Abdominal distension (gaseous): Secondary | ICD-10-CM | POA: Diagnosis not present

## 2014-08-09 DIAGNOSIS — M810 Age-related osteoporosis without current pathological fracture: Secondary | ICD-10-CM | POA: Diagnosis not present

## 2014-08-09 DIAGNOSIS — I10 Essential (primary) hypertension: Secondary | ICD-10-CM

## 2014-08-09 DIAGNOSIS — R252 Cramp and spasm: Secondary | ICD-10-CM

## 2014-08-09 MED ORDER — MAGNESIUM OXIDE 400 MG PO TABS: 400.0000 mg | ORAL_TABLET | Freq: Every day | ORAL | Status: DC

## 2014-08-09 NOTE — Assessment & Plan Note (Signed)
Physical today (08/09/14).  Mammogram 07/14/14 - Birads I.  Colonoscopy 06/2012.

## 2014-08-09 NOTE — Progress Notes (Signed)
Patient ID: Lisa Blackwell, female   DOB: [birth year], 81 y.o.   MRN: [id number]   Subjective:    Patient ID: Lisa Blackwell, female    DOB: 03-Nov-1925, 80 y.o.   MRN: [id number]  HPI  Patient here for her  Physical exam.  She states she is doing relatively well.  Having leg cramps.  Now occurring most every night. Wakes her up from her sleep.  Right > left.  No change in activity.  Tries to stay active.  No cardiac symptoms with increased activity or exertion.  Good appetite.  No increased abdominal bloating.  Bowels stable.  Discussed pneumovax.  She want so check her records.     Past Medical History  Diagnosis Date  . History of chicken pox   . Heart murmur   . Hypertension   . Compression fracture     Spine  . Hx of colonic polyp   . Internal hemorrhoid     Current Outpatient Prescriptions on File Prior to Visit  Medication Sig Dispense Refill  . aspirin EC 81 MG tablet Take 81 mg by mouth daily.    . calcium citrate-vitamin D (CITRACAL+D) 315-200 MG-UNIT per tablet Take 1 tablet by mouth daily.    . Cholecalciferol (VITAMIN D) 2000 UNITS tablet Take 2,000 Units by mouth daily.    Marland Kitchen ipratropium (ATROVENT) 0.03 % nasal spray Place 2 sprays into the nose every 12 (twelve) hours.    . Probiotic Product (ALIGN PO) Take by mouth daily.    . raloxifene (EVISTA) 60 MG tablet Take 1 tablet (60 mg total) by mouth daily. 30 tablet 5  . telmisartan-hydrochlorothiazide (MICARDIS HCT) 40-12.5 MG per tablet Take 0.5 tablets by mouth daily.    . valACYclovir (VALTREX) 500 MG tablet Take 500 mg by mouth 2 (two) times daily as needed.    Marland Kitchen HYDROcodone-acetaminophen (NORCO/VICODIN) 5-325 MG per tablet Take 0.5 tablets by mouth daily as needed. (Patient not taking: Reported on 08/09/2014) 30 tablet 0   No current facility-administered medications on file prior to visit.    Review of Systems  Constitutional: Negative for appetite change and unexpected weight change.  HENT: Negative for  congestion, sinus pressure and sore throat.   Eyes: Negative for pain and visual disturbance.  Respiratory: Negative for cough, chest tightness and shortness of breath.   Cardiovascular: Negative for chest pain, palpitations and leg swelling.  Gastrointestinal: Negative for nausea, vomiting, abdominal pain, diarrhea and constipation.  Genitourinary: Negative for frequency and difficulty urinating.  Musculoskeletal: Negative for back pain and joint swelling.  Skin: Negative for color change and rash.  Neurological: Negative for dizziness, light-headedness and headaches.  Hematological: Negative for adenopathy. Does not bruise/bleed easily.  Psychiatric/Behavioral: Negative for dysphoric mood and decreased concentration.       Objective:    Physical Exam  Constitutional: She is oriented to person, place, and time. She appears well-developed and well-nourished. No distress.  HENT:  Nose: Nose normal.  Mouth/Throat: Oropharynx is clear and moist.  Eyes: Right eye exhibits no discharge. Left eye exhibits no discharge. No scleral icterus.  Neck: Neck supple. No thyromegaly present.  Cardiovascular: Normal rate and regular rhythm.   Pulmonary/Chest: Breath sounds normal. No accessory muscle usage. No tachypnea. No respiratory distress. She has no decreased breath sounds. She has no wheezes. She has no rhonchi. Right breast exhibits no inverted nipple, no mass, no nipple discharge and no tenderness (no axillary adenopathy). Left breast exhibits no inverted nipple, no mass, no nipple  discharge and no tenderness (no axilarry adenopathy).  Abdominal: Soft. Bowel sounds are normal. There is no tenderness.  Musculoskeletal: She exhibits no edema or tenderness.  Lymphadenopathy:    She has no cervical adenopathy.  Neurological: She is alert and oriented to person, place, and time.  Skin: Skin is warm. No rash noted.  Psychiatric: She has a normal mood and affect. Her behavior is normal.    BP  110/80 mmHg  Pulse 65  Temp(Src) 98.2 F (36.8 C) (Oral)  Ht 5\' 2"  (1.575 m)  Wt 131 lb 4 oz (59.535 kg)  BMI 24.00 kg/m2  SpO2 94% Wt Readings from Last 3 Encounters:  08/09/14 131 lb 4 oz (59.535 kg)  03/18/14 130 lb (58.968 kg)  02/17/14 129 lb 8 oz (58.741 kg)     Lab Results  Component Value Date   WBC 4.8 12/14/2013   HGB 12.6 12/14/2013   HCT 38.0 12/14/2013   PLT 165.0 12/14/2013   GLUCOSE 73 03/18/2014   CHOL 188 05/12/2013   TRIG 65.0 05/12/2013   HDL 74.00 05/12/2013   LDLCALC 101* 05/12/2013   ALT 10 03/18/2014   AST 20 03/18/2014   NA 138 03/18/2014   K 4.5 03/18/2014   CL 104 03/18/2014   CREATININE 0.8 03/18/2014   BUN 21 03/18/2014   CO2 31 03/18/2014   TSH 1.79 03/18/2014       Assessment & Plan:   Problem List Items Addressed This Visit    Bloating    Not a significant issue for her now.   Saw Dr Zipporah Plants.        Essential hypertension, benign - Primary    ECHO 01/04/13 - mild global LV dysfunction with EF 40%, moderate mitral and mild tricuspid and aortic insufficiency.  Blood pressure has been controlled follow.        Relevant Orders   Lipid panel   Comprehensive metabolic panel   Health care maintenance    Physical today (08/09/14).  Mammogram 07/14/14 - Birads I.  Colonoscopy 06/2012.        History of colonic polyps    Last colonoscopy 06/2012.        Leg cramps    Has worsened.  Now occurring daily.  Start magoxide 400mg  q day.  Check metabolic panel and magnesium.        Relevant Orders   Magnesium   Osteoporosis    Last bone density 12/14 - osteoporosis.  Recent vitamin D level wnl.          I spent 25 minutes with the patient and more than 50% of the time was spent in consultation regarding the above.     Einar Pheasant, MD

## 2014-08-09 NOTE — Progress Notes (Signed)
Pre visit review using our clinic review tool, if applicable. No additional management support is needed unless otherwise documented below in the visit note. 

## 2014-08-11 ENCOUNTER — Encounter: Payer: Self-pay | Admitting: Internal Medicine

## 2014-08-11 NOTE — Assessment & Plan Note (Signed)
Last colonoscopy 06/2012.

## 2014-08-11 NOTE — Assessment & Plan Note (Signed)
Last bone density 12/14 - osteoporosis.  Recent vitamin D level wnl.

## 2014-08-11 NOTE — Assessment & Plan Note (Signed)
ECHO 01/04/13 - mild global LV dysfunction with EF 40%, moderate mitral and mild tricuspid and aortic insufficiency.  Blood pressure has been controlled follow.

## 2014-08-11 NOTE — Assessment & Plan Note (Signed)
Has worsened.  Now occurring daily.  Start magoxide 400mg  q day.  Check metabolic panel and magnesium.

## 2014-08-11 NOTE — Assessment & Plan Note (Signed)
Not a significant issue for her now.   Saw Dr Zipporah Plants.

## 2014-08-15 ENCOUNTER — Other Ambulatory Visit (INDEPENDENT_AMBULATORY_CARE_PROVIDER_SITE_OTHER): Payer: Medicare Other

## 2014-08-15 DIAGNOSIS — R252 Cramp and spasm: Secondary | ICD-10-CM | POA: Diagnosis not present

## 2014-08-15 DIAGNOSIS — I10 Essential (primary) hypertension: Secondary | ICD-10-CM | POA: Diagnosis not present

## 2014-08-15 LAB — COMPREHENSIVE METABOLIC PANEL
ALK PHOS: 85 U/L (ref 39–117)
ALT: 9 U/L (ref 0–35)
AST: 17 U/L (ref 0–37)
Albumin: 3.6 g/dL (ref 3.5–5.2)
BILIRUBIN TOTAL: 0.4 mg/dL (ref 0.2–1.2)
BUN: 17 mg/dL (ref 6–23)
CHLORIDE: 105 meq/L (ref 96–112)
CO2: 32 mEq/L (ref 19–32)
CREATININE: 0.88 mg/dL (ref 0.40–1.20)
Calcium: 9.4 mg/dL (ref 8.4–10.5)
GFR: 64.62 mL/min (ref >60.00)
GLUCOSE: 78 mg/dL (ref 70–99)
POTASSIUM: 4.4 meq/L (ref 3.5–5.1)
Sodium: 140 mEq/L (ref 135–145)
Total Protein: 6.1 g/dL (ref 6.0–8.3)

## 2014-08-15 LAB — MAGNESIUM: MAGNESIUM: 2.3 mg/dL (ref 1.5–2.5)

## 2014-08-15 LAB — LIPID PANEL
Cholesterol: 185 mg/dL (ref 0–200)
HDL: 70.7 mg/dL (ref >39.00)
LDL Cholesterol: 92 mg/dL (ref 0–99)
NONHDL: 114.3
TRIGLYCERIDES: 114 mg/dL (ref 0.0–149.0)
Total CHOL/HDL Ratio: 3
VLDL: 22.8 mg/dL (ref 0.0–40.0)

## 2014-08-22 ENCOUNTER — Encounter: Payer: Self-pay | Admitting: Internal Medicine

## 2014-08-22 ENCOUNTER — Encounter: Payer: Medicare Other | Admitting: Internal Medicine

## 2014-08-24 NOTE — Telephone Encounter (Signed)
Unread mychart message mailed to patient 

## 2014-08-25 DIAGNOSIS — H2513 Age-related nuclear cataract, bilateral: Secondary | ICD-10-CM | POA: Diagnosis not present

## 2014-09-07 DIAGNOSIS — D1800 Hemangioma unspecified site: Secondary | ICD-10-CM | POA: Diagnosis not present

## 2014-09-07 DIAGNOSIS — L578 Other skin changes due to chronic exposure to nonionizing radiation: Secondary | ICD-10-CM | POA: Diagnosis not present

## 2014-09-07 DIAGNOSIS — Z85828 Personal history of other malignant neoplasm of skin: Secondary | ICD-10-CM | POA: Diagnosis not present

## 2014-09-07 DIAGNOSIS — Z1283 Encounter for screening for malignant neoplasm of skin: Secondary | ICD-10-CM | POA: Diagnosis not present

## 2014-09-07 DIAGNOSIS — L570 Actinic keratosis: Secondary | ICD-10-CM | POA: Diagnosis not present

## 2014-09-07 DIAGNOSIS — L821 Other seborrheic keratosis: Secondary | ICD-10-CM | POA: Diagnosis not present

## 2014-09-22 ENCOUNTER — Other Ambulatory Visit: Payer: Self-pay | Admitting: *Deleted

## 2014-09-22 ENCOUNTER — Encounter: Payer: Self-pay | Admitting: Internal Medicine

## 2014-09-22 MED ORDER — RALOXIFENE HCL 60 MG PO TABS: 60.0000 mg | ORAL_TABLET | Freq: Every day | ORAL | Status: DC

## 2014-10-27 ENCOUNTER — Other Ambulatory Visit: Payer: Self-pay | Admitting: *Deleted

## 2014-10-27 MED ORDER — MAGNESIUM OXIDE 400 MG PO TABS: 400.0000 mg | ORAL_TABLET | Freq: Every day | ORAL | Status: DC

## 2014-11-07 ENCOUNTER — Encounter: Payer: Self-pay | Admitting: Internal Medicine

## 2014-11-07 ENCOUNTER — Ambulatory Visit (INDEPENDENT_AMBULATORY_CARE_PROVIDER_SITE_OTHER): Payer: Medicare Other | Admitting: Internal Medicine

## 2014-11-07 VITALS — BP 122/71 | HR 81 | Temp 97.7°F | Ht 62.0 in | Wt 134.0 lb

## 2014-11-07 DIAGNOSIS — Z0000 Encounter for general adult medical examination without abnormal findings: Secondary | ICD-10-CM

## 2014-11-07 DIAGNOSIS — R252 Cramp and spasm: Secondary | ICD-10-CM | POA: Diagnosis not present

## 2014-11-07 DIAGNOSIS — M810 Age-related osteoporosis without current pathological fracture: Secondary | ICD-10-CM

## 2014-11-07 DIAGNOSIS — Z86010 Personal history of colonic polyps: Secondary | ICD-10-CM

## 2014-11-07 DIAGNOSIS — R198 Other specified symptoms and signs involving the digestive system and abdomen: Secondary | ICD-10-CM

## 2014-11-07 DIAGNOSIS — I10 Essential (primary) hypertension: Secondary | ICD-10-CM

## 2014-11-07 DIAGNOSIS — R6889 Other general symptoms and signs: Secondary | ICD-10-CM

## 2014-11-07 DIAGNOSIS — R634 Abnormal weight loss: Secondary | ICD-10-CM

## 2014-11-07 DIAGNOSIS — R0989 Other specified symptoms and signs involving the circulatory and respiratory systems: Secondary | ICD-10-CM

## 2014-11-07 LAB — CBC WITH DIFFERENTIAL/PLATELET
Basophils Absolute: 0 K/uL (ref 0.0–0.1)
Basophils Relative: 0.6 % (ref 0.0–3.0)
Eosinophils Absolute: 0 K/uL (ref 0.0–0.7)
Eosinophils Relative: 1 % (ref 0.0–5.0)
HCT: 40 % (ref 36.0–46.0)
Hemoglobin: 13.4 g/dL (ref 12.0–15.0)
Lymphocytes Relative: 26.5 % (ref 12.0–46.0)
Lymphs Abs: 1.2 K/uL (ref 0.7–4.0)
MCHC: 33.6 g/dL (ref 30.0–36.0)
MCV: 88.5 fl (ref 78.0–100.0)
Monocytes Absolute: 0.3 K/uL (ref 0.1–1.0)
Monocytes Relative: 7.1 % (ref 3.0–12.0)
Neutro Abs: 3 K/uL (ref 1.4–7.7)
Neutrophils Relative %: 64.8 % (ref 43.0–77.0)
Platelets: 176 K/uL (ref 150.0–400.0)
RBC: 4.52 Mil/uL (ref 3.87–5.11)
RDW: 14 % (ref 11.5–15.5)
WBC: 4.6 K/uL (ref 4.0–10.5)

## 2014-11-07 LAB — BASIC METABOLIC PANEL WITH GFR
BUN: 18 mg/dL (ref 6–23)
CO2: 31 meq/L (ref 19–32)
Calcium: 9.6 mg/dL (ref 8.4–10.5)
Chloride: 104 meq/L (ref 96–112)
Creatinine, Ser: 0.81 mg/dL (ref 0.40–1.20)
GFR: 71.07 mL/min (ref >60.00)
Glucose, Bld: 88 mg/dL (ref 70–99)
Potassium: 3.8 meq/L (ref 3.5–5.1)
Sodium: 139 meq/L (ref 135–145)

## 2014-11-07 NOTE — Progress Notes (Signed)
Pre visit review using our clinic review tool, if applicable. No additional management support is needed unless otherwise documented below in the visit note. 

## 2014-11-07 NOTE — Patient Instructions (Signed)
Zantac (ranitidine) 150mg  - take one per day.  Take 30 minutes before breakfast.

## 2014-11-07 NOTE — Progress Notes (Signed)
Patient ID: Lisa Blackwell, female   DOB: [birth year], 81 y.o.   MRN: [id number]   Subjective:    Patient ID: Lisa Blackwell, female    DOB: 1926-02-02, 81 y.o.   MRN: [id number]  HPI  Patient here for a scheduled follow up.  She is taking magnesium.  Helping some with the cramps.  We again discussed staying hydrated.  We discussed continuing stretches.  She previously had been taking tonic water.  Cramps are better.  Stays active.  No cardiac symptoms with increased activity or exertion.  Breathing stable.  She does report she is clearing her throat - a lot.  No acid reflux.  Was evaluated a few years ago by ENT.  Told had rhinitis.  Using atrovent nasal spray.  No abdominal pain or cramping.  No bowel change.     Past Medical History  Diagnosis Date  . History of chicken pox   . Heart murmur   . Hypertension   . Compression fracture     Spine  . Hx of colonic polyp   . Internal hemorrhoid     Current Outpatient Prescriptions on File Prior to Visit  Medication Sig Dispense Refill  . aspirin EC 81 MG tablet Take 81 mg by mouth daily.    . calcium citrate-vitamin D (CITRACAL+D) 315-200 MG-UNIT per tablet Take 1 tablet by mouth daily.    . Cholecalciferol (VITAMIN D) 2000 UNITS tablet Take 2,000 Units by mouth daily.    Marland Kitchen ipratropium (ATROVENT) 0.03 % nasal spray Place 2 sprays into the nose every 12 (twelve) hours.    . magnesium oxide (MAG-OX) 400 MG tablet Take 1 tablet (400 mg total) by mouth daily. 30 tablet 5  . Probiotic Product (ALIGN PO) Take by mouth daily.    . raloxifene (EVISTA) 60 MG tablet Take 1 tablet (60 mg total) by mouth daily. 30 tablet 5  . telmisartan-hydrochlorothiazide (MICARDIS HCT) 40-12.5 MG per tablet Take 0.5 tablets by mouth daily.    . valACYclovir (VALTREX) 500 MG tablet Take 500 mg by mouth 2 (two) times daily as needed.     No current facility-administered medications on file prior to visit.    Review of Systems  Constitutional: Negative for  appetite change and unexpected weight change.  HENT: Negative for congestion and sinus pressure.   Respiratory: Negative for cough, chest tightness and shortness of breath.   Cardiovascular: Negative for chest pain, palpitations and leg swelling.  Gastrointestinal: Negative for nausea, vomiting, abdominal pain and diarrhea.  Skin: Negative for color change and rash.  Neurological: Negative for dizziness, light-headedness and headaches.  Psychiatric/Behavioral: Negative for dysphoric mood and agitation.       Objective:    Physical Exam  Constitutional: She appears well-developed and well-nourished. No distress.  HENT:  Nose: Nose normal.  Mouth/Throat: Oropharynx is clear and moist.  Neck: Neck supple. No thyromegaly present.  Cardiovascular: Normal rate and regular rhythm.   DP pulses palpable and equal bilaterally.   Pulmonary/Chest: Breath sounds normal. No respiratory distress. She has no wheezes.  Abdominal: Soft. Bowel sounds are normal. There is no tenderness.  Musculoskeletal: She exhibits no edema or tenderness.  Lymphadenopathy:    She has no cervical adenopathy.  Skin: No rash noted. No erythema.  Psychiatric: She has a normal mood and affect. Her behavior is normal.    BP 122/71 mmHg  Pulse 81  Temp(Src) 97.7 F (36.5 C) (Oral)  Ht 5\' 2"  (1.575 m)  Wt 134 lb (60.782  kg)  BMI 24.50 kg/m2  SpO2 95% Wt Readings from Last 3 Encounters:  11/07/14 134 lb (60.782 kg)  08/09/14 131 lb 4 oz (59.535 kg)  03/18/14 130 lb (58.968 kg)     Lab Results  Component Value Date   WBC 4.6 11/07/2014   HGB 13.4 11/07/2014   HCT 40.0 11/07/2014   PLT 176.0 11/07/2014   GLUCOSE 88 11/07/2014   CHOL 185 08/15/2014   TRIG 114.0 08/15/2014   HDL 70.70 08/15/2014   LDLCALC 92 08/15/2014   ALT 9 08/15/2014   AST 17 08/15/2014   NA 139 11/07/2014   K 3.8 11/07/2014   CL 104 11/07/2014   CREATININE 0.81 11/07/2014   BUN 18 11/07/2014   CO2 31 11/07/2014   TSH 1.79  03/18/2014       Assessment & Plan:   Problem List Items Addressed This Visit    Essential hypertension, benign    ECHO as outlined in overview.  Blood pressure as outlined.  Same medication regimen.  Follow pressures.  Follow metabolic panel.       Health care maintenance    Physical 08/19/14.  Mammogram 07/14/14 - Birads I.  Colonoscopy 06/2012.       History of colonic polyps    Last colonoscopy 06/2012.        Leg cramps - Primary    Better on magnesium.  Stay hydrated.  Follow.        Relevant Orders   CBC with Differential/Platelet (Completed)   Basic metabolic panel (Completed)   Loss of weight    Weight up a few pounds from last check.  Follow.       Osteoporosis    Vitamin D level 03/18/14 - 62.  Follow.  Weight bearing exercise.        Throat clearing    Continue atrovent nasal spray.  Saline nasal spray.  Start zantac.  Follow.          I spent 25 minutes with the patient and more than 50% of the time was spent in consultation regarding the above.     Einar Pheasant, MD

## 2014-11-14 ENCOUNTER — Encounter: Payer: Self-pay | Admitting: Internal Medicine

## 2014-11-14 DIAGNOSIS — R0989 Other specified symptoms and signs involving the circulatory and respiratory systems: Secondary | ICD-10-CM | POA: Insufficient documentation

## 2014-11-14 DIAGNOSIS — R6889 Other general symptoms and signs: Secondary | ICD-10-CM | POA: Insufficient documentation

## 2014-11-14 NOTE — Assessment & Plan Note (Signed)
Vitamin D level 03/18/14 - 62.  Follow.  Weight bearing exercise.

## 2014-11-14 NOTE — Assessment & Plan Note (Signed)
Physical 08/19/14.  Mammogram 07/14/14 - Birads I.  Colonoscopy 06/2012.

## 2014-11-14 NOTE — Assessment & Plan Note (Signed)
Last colonoscopy 06/2012.

## 2014-11-14 NOTE — Assessment & Plan Note (Signed)
Better on magnesium.  Stay hydrated.  Follow.

## 2014-11-14 NOTE — Assessment & Plan Note (Signed)
Weight up a few pounds from last check.  Follow  ?

## 2014-11-14 NOTE — Assessment & Plan Note (Signed)
Continue atrovent nasal spray.  Saline nasal spray.  Start zantac.  Follow.

## 2014-11-14 NOTE — Assessment & Plan Note (Signed)
ECHO as outlined in overview.  Blood pressure as outlined.  Same medication regimen.  Follow pressures.  Follow metabolic panel.

## 2014-11-30 DIAGNOSIS — L578 Other skin changes due to chronic exposure to nonionizing radiation: Secondary | ICD-10-CM | POA: Diagnosis not present

## 2014-11-30 DIAGNOSIS — L570 Actinic keratosis: Secondary | ICD-10-CM | POA: Diagnosis not present

## 2014-12-01 ENCOUNTER — Encounter: Payer: Self-pay | Admitting: Nurse Practitioner

## 2014-12-01 ENCOUNTER — Ambulatory Visit (INDEPENDENT_AMBULATORY_CARE_PROVIDER_SITE_OTHER): Payer: Medicare Other | Admitting: Nurse Practitioner

## 2014-12-01 VITALS — BP 118/62 | HR 60 | Temp 97.2°F | Resp 14 | Ht 62.0 in | Wt 134.8 lb

## 2014-12-01 DIAGNOSIS — R21 Rash and other nonspecific skin eruption: Secondary | ICD-10-CM | POA: Diagnosis not present

## 2014-12-01 MED ORDER — DESONIDE 0.05 % EX OINT: 1.0000 "application " | TOPICAL_OINTMENT | Freq: Two times a day (BID) | CUTANEOUS | Status: DC

## 2014-12-01 NOTE — Progress Notes (Signed)
   Subjective:    Patient ID: Lisa Blackwell, female    DOB: April 22, 1926, 81 y.o.   MRN: [id number]  HPI  Lisa Blackwell is a 81 yo female with a CC of rash.   1) Fresh strawberries Thursday [place] [employer]  Friday started, itchy, neck/face/back.   Happened before  OTC cortisone helps somewhat- 3 x a day  Denies- changes to cosmetics, bed sheets, clothes, and detergent  Singular papules that are itchy   Review of Systems  Constitutional: Negative for fever, chills, diaphoresis and fatigue.  Respiratory: Negative for chest tightness, shortness of breath and wheezing.   Cardiovascular: Negative for chest pain, palpitations and leg swelling.  Gastrointestinal: Negative for nausea, vomiting and diarrhea.  Skin: Positive for rash.  Neurological: Negative for dizziness, weakness, numbness and headaches.  Psychiatric/Behavioral: The patient is not nervous/anxious.       Objective:   Physical Exam  Constitutional: She is oriented to person, place, and time. She appears well-developed and well-nourished. No distress.  BP 118/62 mmHg  Pulse 60  Temp(Src) 97.2 F (36.2 C)  Resp 14  Ht 5\' 2"  (1.575 m)  Wt 134 lb 12.8 oz (61.145 kg)  BMI 24.65 kg/m2  SpO2 96%   HENT:  Head: Normocephalic and atraumatic.  Right Ear: External ear normal.  Left Ear: External ear normal.  Cardiovascular: Normal rate, regular rhythm and normal heart sounds.   Pulmonary/Chest: Effort normal and breath sounds normal. No respiratory distress. She has no wheezes. She has no rales. She exhibits no tenderness.  Neurological: She is alert and oriented to person, place, and time. No cranial nerve deficit. She exhibits normal muscle tone. Coordination normal.  Skin: Skin is warm and dry. Rash noted. She is not diaphoretic.  Isolated papules  Psychiatric: She has a normal mood and affect. Her behavior is normal. Judgment and thought content normal.      Assessment & Plan:  Rash  1) Desonide on itchy spots  twice daily 2) Benadryl at night to help with pruritus  3) FU prn worsening/failure to improve.

## 2014-12-01 NOTE — Patient Instructions (Signed)
Desonide ointment on the spots. Twice daily.   Benadryl 25 mg at night to help with sleep and itching.

## 2014-12-01 NOTE — Progress Notes (Signed)
Pre visit review using our clinic review tool, if applicable. No additional management support is needed unless otherwise documented below in the visit note. 

## 2015-01-11 DIAGNOSIS — I429 Cardiomyopathy, unspecified: Secondary | ICD-10-CM | POA: Diagnosis not present

## 2015-01-11 DIAGNOSIS — I340 Nonrheumatic mitral (valve) insufficiency: Secondary | ICD-10-CM | POA: Diagnosis not present

## 2015-01-11 DIAGNOSIS — I10 Essential (primary) hypertension: Secondary | ICD-10-CM | POA: Diagnosis not present

## 2015-01-23 DIAGNOSIS — I340 Nonrheumatic mitral (valve) insufficiency: Secondary | ICD-10-CM | POA: Diagnosis not present

## 2015-01-23 DIAGNOSIS — I10 Essential (primary) hypertension: Secondary | ICD-10-CM | POA: Diagnosis not present

## 2015-01-23 DIAGNOSIS — I429 Cardiomyopathy, unspecified: Secondary | ICD-10-CM | POA: Diagnosis not present

## 2015-01-23 DIAGNOSIS — I351 Nonrheumatic aortic (valve) insufficiency: Secondary | ICD-10-CM | POA: Diagnosis not present

## 2015-02-07 ENCOUNTER — Encounter: Payer: Self-pay | Admitting: Internal Medicine

## 2015-02-07 ENCOUNTER — Ambulatory Visit (INDEPENDENT_AMBULATORY_CARE_PROVIDER_SITE_OTHER): Payer: Medicare Other | Admitting: Internal Medicine

## 2015-02-07 VITALS — BP 108/64 | HR 75 | Temp 97.9°F | Ht 62.0 in | Wt 135.2 lb

## 2015-02-07 DIAGNOSIS — R634 Abnormal weight loss: Secondary | ICD-10-CM

## 2015-02-07 DIAGNOSIS — R252 Cramp and spasm: Secondary | ICD-10-CM | POA: Diagnosis not present

## 2015-02-07 DIAGNOSIS — R0989 Other specified symptoms and signs involving the circulatory and respiratory systems: Secondary | ICD-10-CM

## 2015-02-07 DIAGNOSIS — I10 Essential (primary) hypertension: Secondary | ICD-10-CM

## 2015-02-07 DIAGNOSIS — R198 Other specified symptoms and signs involving the digestive system and abdomen: Secondary | ICD-10-CM

## 2015-02-07 DIAGNOSIS — M810 Age-related osteoporosis without current pathological fracture: Secondary | ICD-10-CM

## 2015-02-07 DIAGNOSIS — R6889 Other general symptoms and signs: Secondary | ICD-10-CM

## 2015-02-07 LAB — BASIC METABOLIC PANEL
BUN: 22 mg/dL (ref 6–23)
CHLORIDE: 104 meq/L (ref 96–112)
CO2: 31 meq/L (ref 19–32)
CREATININE: 0.89 mg/dL (ref 0.40–1.20)
Calcium: 9.3 mg/dL (ref 8.4–10.5)
GFR: 63.72 mL/min (ref >60.00)
Glucose, Bld: 83 mg/dL (ref 70–99)
POTASSIUM: 4.3 meq/L (ref 3.5–5.1)
Sodium: 139 mEq/L (ref 135–145)

## 2015-02-07 LAB — MAGNESIUM: Magnesium: 2.2 mg/dL (ref 1.5–2.5)

## 2015-02-07 MED ORDER — TELMISARTAN 20 MG PO TABS: 20.0000 mg | ORAL_TABLET | Freq: Every day | ORAL | Status: DC

## 2015-02-07 NOTE — Progress Notes (Signed)
Pre visit review using our clinic review tool, if applicable. No additional management support is needed unless otherwise documented below in the visit note. 

## 2015-02-07 NOTE — Progress Notes (Signed)
Patient ID: Lisa Blackwell, female   DOB: 1925/07/14, 81 y.o.   MRN: [id number]   Subjective:    Patient ID: Lisa Blackwell, female    DOB: 04/21/1926, 81 y.o.   MRN: [id number]  HPI  Patient here for a scheduled follow up.  Still clearing her throat.  The zantac is not helping.  Notices some drainage.  Using atrovent nasal spray.  No chest congestion.  No cough.  No acid reflux.  Eating and drinking well.  No nausea or vomiting.  No bowel change.  Still with leg cramps.  Magnesium may be helping some.  Still persistent.  Trying to stay hydrated.  Bowels stable.     Past Medical History  Diagnosis Date  . History of chicken pox   . Heart murmur   . Hypertension   . Compression fracture     Spine  . Hx of colonic polyp   . Internal hemorrhoid     Family history and social history reviewed.     Outpatient Encounter Prescriptions as of 02/07/2015  Medication Sig  . aspirin EC 81 MG tablet Take 81 mg by mouth daily.  . calcium citrate-vitamin D (CITRACAL+D) 315-200 MG-UNIT per tablet Take 1 tablet by mouth daily.  . Cholecalciferol (VITAMIN D) 2000 UNITS tablet Take 2,000 Units by mouth daily.  Marland Kitchen ipratropium (ATROVENT) 0.03 % nasal spray Place 2 sprays into the nose every 12 (twelve) hours.  . magnesium oxide (MAG-OX) 400 MG tablet Take 1 tablet (400 mg total) by mouth daily.  . Probiotic Product (ALIGN PO) Take by mouth daily.  . raloxifene (EVISTA) 60 MG tablet Take 1 tablet (60 mg total) by mouth daily.  . ranitidine (ZANTAC) 150 MG tablet Take 150 mg by mouth 2 (two) times daily.  Marland Kitchen telmisartan (MICARDIS) 20 MG tablet Take 1 tablet (20 mg total) by mouth daily.  . valACYclovir (VALTREX) 500 MG tablet Take 500 mg by mouth 2 (two) times daily as needed.  . [DISCONTINUED] desonide (DESOWEN) 0.05 % ointment Apply 1 application topically 2 (two) times daily.  . [DISCONTINUED] telmisartan-hydrochlorothiazide (MICARDIS HCT) 40-12.5 MG per tablet Take 0.5 tablets by mouth daily.    No facility-administered encounter medications on file as of 02/07/2015.    Review of Systems  Constitutional: Negative for appetite change and unexpected weight change.  HENT: Positive for postnasal drip. Negative for congestion and sinus pressure.   Eyes: Negative for discharge and visual disturbance.  Respiratory: Negative for cough, chest tightness and shortness of breath.   Cardiovascular: Negative for chest pain, palpitations and leg swelling.  Gastrointestinal: Negative for nausea, vomiting, abdominal pain and diarrhea.       No acid reflux.    Genitourinary: Negative for dysuria and difficulty urinating.  Musculoskeletal: Negative for back pain.       Bilateral leg cramping.   Skin: Negative for color change and rash.  Neurological: Negative for dizziness, light-headedness and headaches.  Psychiatric/Behavioral: Negative for dysphoric mood and agitation.       Objective:    Physical Exam  Constitutional: She appears well-developed and well-nourished. No distress.  HENT:  Nose: Nose normal.  Mouth/Throat: Oropharynx is clear and moist.  Eyes: Conjunctivae are normal. Right eye exhibits no discharge. Left eye exhibits no discharge.  Neck: Neck supple. No thyromegaly present.  Cardiovascular: Normal rate and regular rhythm.   Distal pulsed palpable and equal bilaterally.   Pulmonary/Chest: Breath sounds normal. No respiratory distress. She has no wheezes.  Abdominal: Soft. Bowel sounds  are normal. There is no tenderness.  Musculoskeletal: She exhibits no edema or tenderness.  Lymphadenopathy:    She has no cervical adenopathy.  Skin: No rash noted. No erythema.  Psychiatric: She has a normal mood and affect. Her behavior is normal.    BP 108/64 mmHg  Pulse 75  Temp(Src) 97.9 F (36.6 C) (Oral)  Ht _0  (1.575 m)  Wt 135 lb 3.2 oz (61.326 kg)  BMI 24.72 kg/m2  SpO2 97% Wt Readings from Last 3 Encounters:  02/07/15 135 lb 3.2 oz (61.326 kg)  12/01/14 134 lb  12.8 oz (61.145 kg)  11/07/14 134 lb (60.782 kg)     Lab Results  Component Value Date   WBC 4.6 11/07/2014   HGB 13.4 11/07/2014   HCT 40.0 11/07/2014   PLT 176.0 11/07/2014   GLUCOSE 83 02/07/2015   CHOL 185 08/15/2014   TRIG 114.0 08/15/2014   HDL 70.70 08/15/2014   LDLCALC 92 08/15/2014   ALT 9 08/15/2014   AST 17 08/15/2014   NA 139 02/07/2015   K 4.3 02/07/2015   CL 104 02/07/2015   CREATININE 0.89 02/07/2015   BUN 22 02/07/2015   CO2 31 02/07/2015   TSH 1.79 03/18/2014       Assessment & Plan:   Problem List Items Addressed This Visit    Essential hypertension, benign    ECHO as outlined (overview).  Blood pressure as outlined.  Same medication regimen.  Follow.  Follow metabolic panel.        Relevant Medications   telmisartan (MICARDIS) 20 MG tablet   Other Relevant Orders   Basic metabolic panel (Completed)   Leg cramps - Primary    Taking magnesium.  Stretches.  Check met b and magnesium level.        Relevant Orders   Magnesium (Completed)   Loss of weight    Weight has stabilized now.        Osteoporosis    Vitamin D level 03/18/14 - 62.  Follow.  Weight bearing exercise.        Throat clearing    Persistent despite atrovent nasal spray and zantac.  Will try adding an antihistamine and nasacort nasal spray as directed.  Call with update over the next couple of weeks.  If persistent, will refer to ENT.            Einar Pheasant, MD

## 2015-02-07 NOTE — Patient Instructions (Signed)
Stop the micardis/hctz.  Start micardis 20mg  - one per day.    Take zyrtec or claritin - one per day.    nasacort nasal spray - 2 sprays each nostril one time per day.

## 2015-02-08 ENCOUNTER — Encounter: Payer: Self-pay | Admitting: Internal Medicine

## 2015-02-08 NOTE — Assessment & Plan Note (Signed)
Persistent despite atrovent nasal spray and zantac.  Will try adding an antihistamine and nasacort nasal spray as directed.  Call with update over the next couple of weeks.  If persistent, will refer to ENT.

## 2015-02-08 NOTE — Assessment & Plan Note (Signed)
Taking magnesium.  Stretches.  Check met b and magnesium level.

## 2015-02-08 NOTE — Assessment & Plan Note (Signed)
Vitamin D level 03/18/14 - 62.  Follow.  Weight bearing exercise.

## 2015-02-08 NOTE — Assessment & Plan Note (Signed)
Weight has stabilized now.

## 2015-02-08 NOTE — Assessment & Plan Note (Signed)
ECHO as outlined (overview).  Blood pressure as outlined.  Same medication regimen.  Follow.  Follow metabolic panel.

## 2015-02-09 NOTE — Telephone Encounter (Signed)
Unread mychart message mailed  

## 2015-02-16 ENCOUNTER — Encounter: Payer: Self-pay | Admitting: Internal Medicine

## 2015-03-18 ENCOUNTER — Other Ambulatory Visit: Payer: Self-pay | Admitting: Internal Medicine

## 2015-03-30 DIAGNOSIS — L570 Actinic keratosis: Secondary | ICD-10-CM | POA: Diagnosis not present

## 2015-03-30 DIAGNOSIS — L812 Freckles: Secondary | ICD-10-CM | POA: Diagnosis not present

## 2015-03-30 DIAGNOSIS — L578 Other skin changes due to chronic exposure to nonionizing radiation: Secondary | ICD-10-CM | POA: Diagnosis not present

## 2015-03-30 DIAGNOSIS — L821 Other seborrheic keratosis: Secondary | ICD-10-CM | POA: Diagnosis not present

## 2015-03-30 DIAGNOSIS — L820 Inflamed seborrheic keratosis: Secondary | ICD-10-CM | POA: Diagnosis not present

## 2015-03-31 ENCOUNTER — Ambulatory Visit (INDEPENDENT_AMBULATORY_CARE_PROVIDER_SITE_OTHER): Payer: Medicare Other | Admitting: Internal Medicine

## 2015-03-31 ENCOUNTER — Encounter: Payer: Self-pay | Admitting: Internal Medicine

## 2015-03-31 VITALS — BP 120/62 | HR 74 | Temp 98.2°F | Resp 17 | Ht 62.0 in | Wt 135.5 lb

## 2015-03-31 DIAGNOSIS — R1011 Right upper quadrant pain: Secondary | ICD-10-CM

## 2015-03-31 DIAGNOSIS — I10 Essential (primary) hypertension: Secondary | ICD-10-CM | POA: Diagnosis not present

## 2015-03-31 DIAGNOSIS — R634 Abnormal weight loss: Secondary | ICD-10-CM

## 2015-03-31 DIAGNOSIS — R140 Abdominal distension (gaseous): Secondary | ICD-10-CM | POA: Diagnosis not present

## 2015-03-31 DIAGNOSIS — R198 Other specified symptoms and signs involving the digestive system and abdomen: Secondary | ICD-10-CM | POA: Diagnosis not present

## 2015-03-31 DIAGNOSIS — R6889 Other general symptoms and signs: Secondary | ICD-10-CM

## 2015-03-31 DIAGNOSIS — R0989 Other specified symptoms and signs involving the circulatory and respiratory systems: Secondary | ICD-10-CM

## 2015-03-31 NOTE — Progress Notes (Signed)
Pre-visit discussion using our clinic review tool. No additional management support is needed unless otherwise documented below in the visit note.  

## 2015-03-31 NOTE — Progress Notes (Signed)
Patient ID: ROSENDA Blackwell, female   DOB: 12-16-1925, 81 y.o.   MRN: [id number]   Subjective:    Patient ID: Lisa Blackwell, female    DOB: [birth year], 81 y.o.   MRN: [id number]  HPI  Patient with past history of hypertension, osteoporosis and persistent throat clearing, who comes in today for a scheduled follow up of these issues.  She is still clearing her throat.  Has tried claritin, zantac and nasal sprays.  No improvement.  No chest congestion  No increased cough.  No sob.  No cardiac symptoms with increased activity or exertion.  No acid reflux reported.  Occasionally will notice some pain RUQ, but only notices when she bends a certain way(at times).  No other abdominal pain or cramping.  Appetite is good.  Bowels doing well.     Past Medical History  Diagnosis Date  . History of chicken pox   . Heart murmur   . Hypertension   . Compression fracture     Spine  . Hx of colonic polyp   . Internal hemorrhoid    Past Surgical History  Procedure Laterality Date  . Appendectomy  1957  . Hemorrhoid banding  March 2015    interal rubber band ligation   Family History  Problem Relation Age of Onset  . Breast cancer Mother   . Arthritis Mother   . Colon cancer Father   . Stroke Father   . Heart disease Maternal Grandmother   . Heart disease Paternal Grandmother    Social History   Social History  . Marital Status: Married    Spouse Name: N/A  . Number of Children: 1  . Years of Education: N/A   Social History Main Topics  . Smoking status: Never Smoker   . Smokeless tobacco: Never Used  . Alcohol Use: No  . Drug Use: No  . Sexual Activity: Not Asked   Other Topics Concern  . None   Social History Narrative    Outpatient Encounter Prescriptions as of 03/31/2015  Medication Sig  . aspirin EC 81 MG tablet Take 81 mg by mouth daily.  . calcium citrate-vitamin D (CITRACAL+D) 315-200 MG-UNIT per tablet Take 1 tablet by mouth daily.  . Cholecalciferol (VITAMIN D)  2000 UNITS tablet Take 2,000 Units by mouth daily.  Marland Kitchen ipratropium (ATROVENT) 0.03 % nasal spray Place 2 sprays into the nose every 12 (twelve) hours.  Marland Kitchen loratadine (CLARITIN) 10 MG tablet Take 10 mg by mouth daily as needed for allergies.  . magnesium oxide (MAG-OX) 400 MG tablet Take 1 tablet (400 mg total) by mouth daily.  . Probiotic Product (ALIGN PO) Take by mouth daily.  . raloxifene (EVISTA) 60 MG tablet take 1 tablet by mouth daily  . telmisartan (MICARDIS) 20 MG tablet Take 1 tablet (20 mg total) by mouth daily.  . valACYclovir (VALTREX) 500 MG tablet Take 500 mg by mouth 2 (two) times daily as needed.  . [DISCONTINUED] ranitidine (ZANTAC) 150 MG tablet Take 150 mg by mouth 2 (two) times daily.   No facility-administered encounter medications on file as of 03/31/2015.    Review of Systems  Constitutional: Negative for appetite change and unexpected weight change.  HENT: Negative for congestion and sinus pressure.        Persistent clearing of her throat.   Eyes: Negative for pain and visual disturbance.  Respiratory: Negative for cough, chest tightness and shortness of breath.   Cardiovascular: Negative for chest pain, palpitations and leg swelling.  Gastrointestinal: Negative for nausea, abdominal pain and diarrhea.  Genitourinary: Negative for dysuria and difficulty urinating.  Musculoskeletal: Negative for back pain and joint swelling.  Skin: Negative for color change and rash.  Neurological: Negative for dizziness, light-headedness and headaches.  Psychiatric/Behavioral: Negative for dysphoric mood and agitation.       Objective:   blood pressure rechecked by me:  124/72  Physical Exam  Constitutional: She appears well-developed and well-nourished. No distress.  HENT:  Nose: Nose normal.  Mouth/Throat: Oropharynx is clear and moist.  Eyes: Conjunctivae are normal. Right eye exhibits no discharge. Left eye exhibits no discharge.  Neck: Neck supple. No thyromegaly  present.  Cardiovascular: Normal rate and regular rhythm.   Pulmonary/Chest: Breath sounds normal. No respiratory distress. She has no wheezes.  Abdominal: Soft. Bowel sounds are normal. There is no tenderness.  Musculoskeletal: She exhibits no edema or tenderness.  Lymphadenopathy:    She has no cervical adenopathy.  Skin: No rash noted. No erythema.  Psychiatric: She has a normal mood and affect. Her behavior is normal.    BP 120/62 mmHg  Pulse 74  Temp(Src) 98.2 F (36.8 C) (Oral)  Resp 17  Ht 5\' 2"  (1.575 m)  Wt 135 lb 8 oz (61.462 kg)  BMI 24.78 kg/m2  SpO2 95% Wt Readings from Last 3 Encounters:  03/31/15 135 lb 8 oz (61.462 kg)  02/07/15 135 lb 3.2 oz (61.326 kg)  12/01/14 134 lb 12.8 oz (61.145 kg)     Lab Results  Component Value Date   WBC 4.6 11/07/2014   HGB 13.4 11/07/2014   HCT 40.0 11/07/2014   PLT 176.0 11/07/2014   GLUCOSE 83 02/07/2015   CHOL 185 08/15/2014   TRIG 114.0 08/15/2014   HDL 70.70 08/15/2014   LDLCALC 92 08/15/2014   ALT 9 08/15/2014   AST 17 08/15/2014   NA 139 02/07/2015   K 4.3 02/07/2015   CL 104 02/07/2015   CREATININE 0.89 02/07/2015   BUN 22 02/07/2015   CO2 31 02/07/2015   TSH 1.79 03/18/2014       Assessment & Plan:   Problem List Items Addressed This Visit    Bloating    No an issue for her now.  Saw Dr Enzo Bi.  Follow.       Essential hypertension, benign    Blood pressure under good control.  Continue same medication regimen.  Follow pressures.  Follow metabolic panel.        Loss of weight    Weight has stabilized now.  Doing well.  Good appetite.  Follow.        RUQ pain    No severe pain.  Only notices occasionally when she bends.  Discussed further w/up.  Follow.         Throat clearing - Primary    Persistent clearing of her throat despite nasal sprays, antihistamines and zantac.  Will refer to ENT for evaluation.        Relevant Orders   Ambulatory referral to ENT       Einar Pheasant,  MD

## 2015-04-01 ENCOUNTER — Encounter: Payer: Self-pay | Admitting: Internal Medicine

## 2015-04-01 DIAGNOSIS — R1011 Right upper quadrant pain: Secondary | ICD-10-CM | POA: Insufficient documentation

## 2015-04-01 NOTE — Assessment & Plan Note (Signed)
Blood pressure under good control.  Continue same medication regimen.  Follow pressures.  Follow metabolic panel.   

## 2015-04-01 NOTE — Assessment & Plan Note (Signed)
Weight has stabilized now.  Doing well.  Good appetite.  Follow.

## 2015-04-01 NOTE — Assessment & Plan Note (Signed)
Persistent clearing of her throat despite nasal sprays, antihistamines and zantac.  Will refer to ENT for evaluation.

## 2015-04-01 NOTE — Assessment & Plan Note (Signed)
No an issue for her now.  Saw Dr Enzo Bi.  Follow.

## 2015-04-01 NOTE — Assessment & Plan Note (Signed)
No severe pain.  Only notices occasionally when she bends.  Discussed further w/up.  Follow.

## 2015-04-04 DIAGNOSIS — R490 Dysphonia: Secondary | ICD-10-CM | POA: Diagnosis not present

## 2015-04-04 DIAGNOSIS — H6123 Impacted cerumen, bilateral: Secondary | ICD-10-CM | POA: Diagnosis not present

## 2015-04-04 DIAGNOSIS — F458 Other somatoform disorders: Secondary | ICD-10-CM | POA: Diagnosis not present

## 2015-04-12 ENCOUNTER — Ambulatory Visit (INDEPENDENT_AMBULATORY_CARE_PROVIDER_SITE_OTHER): Payer: BC Managed Care – PPO | Admitting: Podiatry

## 2015-04-12 ENCOUNTER — Encounter: Payer: Self-pay | Admitting: Podiatry

## 2015-04-12 DIAGNOSIS — L851 Acquired keratosis [keratoderma] palmaris et plantaris: Secondary | ICD-10-CM | POA: Diagnosis not present

## 2015-04-12 NOTE — Progress Notes (Signed)
She presents today chief complaint of a single painful lesion to the plantar aspect of her left heel.  Objective: Pulses are strongly palpable left. Salter porokeratosis plantar aspect of the left heel.  Assessment: Porokeratosis left.  Plan: Debrided porokeratotic lesion. Follow up with me as needed.

## 2015-04-18 DIAGNOSIS — Z23 Encounter for immunization: Secondary | ICD-10-CM | POA: Diagnosis not present

## 2015-04-26 DIAGNOSIS — Z808 Family history of malignant neoplasm of other organs or systems: Secondary | ICD-10-CM | POA: Diagnosis not present

## 2015-04-26 DIAGNOSIS — L570 Actinic keratosis: Secondary | ICD-10-CM | POA: Diagnosis not present

## 2015-04-26 DIAGNOSIS — Z85828 Personal history of other malignant neoplasm of skin: Secondary | ICD-10-CM | POA: Diagnosis not present

## 2015-04-26 DIAGNOSIS — L820 Inflamed seborrheic keratosis: Secondary | ICD-10-CM | POA: Diagnosis not present

## 2015-05-07 ENCOUNTER — Other Ambulatory Visit: Payer: Self-pay | Admitting: Internal Medicine

## 2015-08-01 DIAGNOSIS — H903 Sensorineural hearing loss, bilateral: Secondary | ICD-10-CM | POA: Diagnosis not present

## 2015-08-01 DIAGNOSIS — H6123 Impacted cerumen, bilateral: Secondary | ICD-10-CM | POA: Diagnosis not present

## 2015-08-02 DIAGNOSIS — I428 Other cardiomyopathies: Secondary | ICD-10-CM | POA: Diagnosis not present

## 2015-08-02 DIAGNOSIS — R001 Bradycardia, unspecified: Secondary | ICD-10-CM | POA: Diagnosis not present

## 2015-08-02 DIAGNOSIS — I10 Essential (primary) hypertension: Secondary | ICD-10-CM | POA: Diagnosis not present

## 2015-08-03 ENCOUNTER — Ambulatory Visit (INDEPENDENT_AMBULATORY_CARE_PROVIDER_SITE_OTHER): Payer: Medicare Other | Admitting: Internal Medicine

## 2015-08-03 ENCOUNTER — Encounter: Payer: Self-pay | Admitting: Internal Medicine

## 2015-08-03 VITALS — BP 140/80 | HR 67 | Temp 97.7°F | Wt 133.2 lb

## 2015-08-03 DIAGNOSIS — R05 Cough: Secondary | ICD-10-CM | POA: Diagnosis not present

## 2015-08-03 DIAGNOSIS — R0989 Other specified symptoms and signs involving the circulatory and respiratory systems: Secondary | ICD-10-CM

## 2015-08-03 DIAGNOSIS — R059 Cough, unspecified: Secondary | ICD-10-CM

## 2015-08-03 DIAGNOSIS — R634 Abnormal weight loss: Secondary | ICD-10-CM

## 2015-08-03 DIAGNOSIS — F439 Reaction to severe stress, unspecified: Secondary | ICD-10-CM

## 2015-08-03 DIAGNOSIS — M810 Age-related osteoporosis without current pathological fracture: Secondary | ICD-10-CM | POA: Diagnosis not present

## 2015-08-03 DIAGNOSIS — Z658 Other specified problems related to psychosocial circumstances: Secondary | ICD-10-CM

## 2015-08-03 DIAGNOSIS — R6889 Other general symptoms and signs: Secondary | ICD-10-CM

## 2015-08-03 DIAGNOSIS — R198 Other specified symptoms and signs involving the digestive system and abdomen: Secondary | ICD-10-CM

## 2015-08-03 DIAGNOSIS — I10 Essential (primary) hypertension: Secondary | ICD-10-CM | POA: Diagnosis not present

## 2015-08-03 LAB — COMPREHENSIVE METABOLIC PANEL
ALBUMIN: 3.6 g/dL (ref 3.5–5.2)
ALK PHOS: 93 U/L (ref 39–117)
ALT: 9 U/L (ref 0–35)
AST: 16 U/L (ref 0–37)
BILIRUBIN TOTAL: 0.4 mg/dL (ref 0.2–1.2)
BUN: 21 mg/dL (ref 6–23)
CALCIUM: 9.1 mg/dL (ref 8.4–10.5)
CO2: 31 mEq/L (ref 19–32)
Chloride: 106 mEq/L (ref 96–112)
Creatinine, Ser: 0.75 mg/dL (ref 0.40–1.20)
GFR: 77.54 mL/min (ref >60.00)
Glucose, Bld: 109 mg/dL — ABNORMAL HIGH (ref 70–99)
POTASSIUM: 4.4 meq/L (ref 3.5–5.1)
Sodium: 141 mEq/L (ref 135–145)
TOTAL PROTEIN: 5.9 g/dL — AB (ref 6.0–8.3)

## 2015-08-03 LAB — TSH: TSH: 1.12 u[IU]/mL (ref 0.35–4.50)

## 2015-08-03 LAB — VITAMIN D 25 HYDROXY (VIT D DEFICIENCY, FRACTURES): VITD: 77.14 ng/mL (ref 30.00–100.00)

## 2015-08-03 NOTE — Progress Notes (Signed)
Patient ID: Lisa Blackwell, female   DOB: [birth year], 82 y.o.   MRN: [id number]   Subjective:    Patient ID: Lisa Blackwell, female    DOB: 1925-09-23, 82 y.o.   MRN: [id number]  HPI  Patient with past history of hypertension and persistent throat clearing and cough.  She reports she is doing relatively well.  Tries to say active.  No cardiac symptoms with increased activity or exertion.  Breathing stable.  Still with persistent cough.  No chest congestion.  Persistent clearing her her throat.  Saw ENT.  No abnormality found.  Increased stress.  She does not feel she needs anything more at this point.  No abdominal pain or cramping.  Bowels stable.     Past Medical History  Diagnosis Date  . History of chicken pox   . Heart murmur   . Hypertension   . Compression fracture     Spine  . Hx of colonic polyp   . Internal hemorrhoid    Past Surgical History  Procedure Laterality Date  . Appendectomy  1957  . Hemorrhoid banding  March 2015    interal rubber band ligation   Family History  Problem Relation Age of Onset  . Breast cancer Mother   . Arthritis Mother   . Colon cancer Father   . Stroke Father   . Heart disease Maternal Grandmother   . Heart disease Paternal Grandmother    Social History   Social History  . Marital Status: Married    Spouse Name: N/A  . Number of Children: 1  . Years of Education: N/A   Social History Main Topics  . Smoking status: Never Smoker   . Smokeless tobacco: Never Used  . Alcohol Use: No  . Drug Use: No  . Sexual Activity: Not Asked   Other Topics Concern  . None   Social History Narrative    Outpatient Encounter Prescriptions as of 08/03/2015  Medication Sig  . aspirin EC 81 MG tablet Take 81 mg by mouth daily.  . calcium citrate-vitamin D (CITRACAL+D) 315-200 MG-UNIT per tablet Take 1 tablet by mouth daily.  . Cholecalciferol (VITAMIN D) 2000 UNITS tablet Take 2,000 Units by mouth daily.  Marland Kitchen ipratropium (ATROVENT) 0.03 %  nasal spray Place 2 sprays into the nose every 12 (twelve) hours.  Marland Kitchen loratadine (CLARITIN) 10 MG tablet Take 10 mg by mouth daily as needed for allergies. Reported on 08/03/2015  . magnesium oxide (MAG-OX) 400 MG tablet Take 1 tablet (400 mg total) by mouth daily.  . Probiotic Product (ALIGN PO) Take by mouth daily.  . raloxifene (EVISTA) 60 MG tablet take 1 tablet by mouth daily  . telmisartan (MICARDIS) 20 MG tablet take 1 tablet by mouth once daily  . valACYclovir (VALTREX) 500 MG tablet Take 500 mg by mouth 2 (two) times daily as needed.   No facility-administered encounter medications on file as of 08/03/2015.    Review of Systems  Constitutional: Negative for appetite change and unexpected weight change.  HENT: Negative for congestion and sinus pressure.        Persistent clearing of her throat.   Eyes: Negative for discharge and redness.  Respiratory: Positive for cough. Negative for chest tightness and shortness of breath.   Cardiovascular: Negative for chest pain, palpitations and leg swelling.  Gastrointestinal: Negative for nausea, vomiting, abdominal pain and diarrhea.  Genitourinary: Negative for dysuria and difficulty urinating.  Musculoskeletal: Negative for back pain and joint swelling.  Skin: Negative  for color change and rash.  Neurological: Negative for dizziness, light-headedness and headaches.  Psychiatric/Behavioral: Negative for dysphoric mood and agitation.       Objective:    Physical Exam  Constitutional: She appears well-developed and well-nourished. No distress.  HENT:  Nose: Nose normal.  Mouth/Throat: Oropharynx is clear and moist.  Eyes: Conjunctivae are normal. Right eye exhibits no discharge. Left eye exhibits no discharge.  Neck: Neck supple. No thyromegaly present.  Cardiovascular: Normal rate and regular rhythm.   Pulmonary/Chest: Breath sounds normal. No respiratory distress. She has no wheezes.  Abdominal: Soft. Bowel sounds are normal. There is  no tenderness.  Musculoskeletal: She exhibits no edema or tenderness.  Lymphadenopathy:    She has no cervical adenopathy.  Skin: No rash noted. No erythema.  Psychiatric: She has a normal mood and affect. Her behavior is normal.    BP 140/80 mmHg  Pulse 67  Temp(Src) 97.7 F (36.5 C) (Oral)  Wt 133 lb 4 oz (60.442 kg)  SpO2 96% Wt Readings from Last 3 Encounters:  08/03/15 133 lb 4 oz (60.442 kg)  03/31/15 135 lb 8 oz (61.462 kg)  02/07/15 135 lb 3.2 oz (61.326 kg)     Lab Results  Component Value Date   WBC 4.6 11/07/2014   HGB 13.4 11/07/2014   HCT 40.0 11/07/2014   PLT 176.0 11/07/2014   GLUCOSE 109* 08/03/2015   CHOL 185 08/15/2014   TRIG 114.0 08/15/2014   HDL 70.70 08/15/2014   LDLCALC 92 08/15/2014   ALT 9 08/03/2015   AST 16 08/03/2015   NA 141 08/03/2015   K 4.4 08/03/2015   CL 106 08/03/2015   CREATININE 0.75 08/03/2015   BUN 21 08/03/2015   CO2 31 08/03/2015   TSH 1.12 08/03/2015       Assessment & Plan:   Problem List Items Addressed This Visit    Essential hypertension, benign    Blood pressure under good control.  Continue same medication regimen.  Follow pressures.  Follow metabolic panel.        Relevant Orders   TSH (Completed)   Comprehensive metabolic panel (Completed)   Lipid panel   Comprehensive metabolic panel   Loss of weight    Appetite is good.  Weight down a couple of pounds.  Follow.        Osteoporosis   Relevant Orders   VITAMIN D 25 Hydroxy (Vit-D Deficiency, Fractures) (Completed)   Stress    Increased stress as outlined.  Discussed with her today.  Desires no further intervention.  Follow.        Throat clearing    Persistent cough and clearing of her throat.  She has tried nasal sprays, antihistamines and zantac.  Referred to ENT.  She states w/up was unrevealing.  Obtain records.  Will check cxr.        Other Visit Diagnoses    Cough    -  Primary    Relevant Orders    DG Chest 2 View        Einar Pheasant, MD

## 2015-08-03 NOTE — Progress Notes (Signed)
Pre visit review using our clinic review tool, if applicable. No additional management support is needed unless otherwise documented below in the visit note. 

## 2015-08-04 ENCOUNTER — Encounter: Payer: Self-pay | Admitting: Internal Medicine

## 2015-08-06 ENCOUNTER — Encounter: Payer: Self-pay | Admitting: Internal Medicine

## 2015-08-06 DIAGNOSIS — F439 Reaction to severe stress, unspecified: Secondary | ICD-10-CM | POA: Insufficient documentation

## 2015-08-06 NOTE — Assessment & Plan Note (Signed)
Persistent cough and clearing of her throat.  She has tried nasal sprays, antihistamines and zantac.  Referred to ENT.  She states w/up was unrevealing.  Obtain records.  Will check cxr.

## 2015-08-06 NOTE — Assessment & Plan Note (Signed)
Increased stress as outlined.  Discussed with her today.  Desires no further intervention.  Follow.  

## 2015-08-06 NOTE — Assessment & Plan Note (Signed)
Blood pressure under good control.  Continue same medication regimen.  Follow pressures.  Follow metabolic panel.   

## 2015-08-06 NOTE — Assessment & Plan Note (Signed)
Appetite is good.  Weight down a couple of pounds.  Follow.

## 2015-08-07 ENCOUNTER — Ambulatory Visit
Admission: RE | Admit: 2015-08-07 | Discharge: 2015-08-07 | Disposition: A | Discharge status: Home / Self Care | Payer: Medicare Other | Source: Ambulatory Visit | Attending: Internal Medicine | Admitting: Internal Medicine

## 2015-08-07 DIAGNOSIS — R05 Cough: Secondary | ICD-10-CM

## 2015-08-07 DIAGNOSIS — R918 Other nonspecific abnormal finding of lung field: Secondary | ICD-10-CM | POA: Diagnosis not present

## 2015-08-07 DIAGNOSIS — R059 Cough, unspecified: Secondary | ICD-10-CM

## 2015-08-09 ENCOUNTER — Other Ambulatory Visit: Payer: Self-pay | Admitting: Internal Medicine

## 2015-08-09 DIAGNOSIS — R9389 Abnormal findings on diagnostic imaging of other specified body structures: Secondary | ICD-10-CM

## 2015-08-09 NOTE — Progress Notes (Signed)
Order placed for chest ct.

## 2015-08-10 ENCOUNTER — Ambulatory Visit (INDEPENDENT_AMBULATORY_CARE_PROVIDER_SITE_OTHER): Payer: Medicare Other | Admitting: Internal Medicine

## 2015-08-10 ENCOUNTER — Encounter: Payer: Self-pay | Admitting: Internal Medicine

## 2015-08-10 VITALS — BP 130/72 | HR 63 | Temp 98.3°F | Resp 14 | Ht 62.0 in | Wt 132.2 lb

## 2015-08-10 DIAGNOSIS — R6889 Other general symptoms and signs: Secondary | ICD-10-CM

## 2015-08-10 DIAGNOSIS — I10 Essential (primary) hypertension: Secondary | ICD-10-CM | POA: Diagnosis not present

## 2015-08-10 DIAGNOSIS — R938 Abnormal findings on diagnostic imaging of other specified body structures: Secondary | ICD-10-CM | POA: Diagnosis not present

## 2015-08-10 DIAGNOSIS — R9389 Abnormal findings on diagnostic imaging of other specified body structures: Secondary | ICD-10-CM

## 2015-08-10 DIAGNOSIS — R634 Abnormal weight loss: Secondary | ICD-10-CM

## 2015-08-10 DIAGNOSIS — R198 Other specified symptoms and signs involving the digestive system and abdomen: Secondary | ICD-10-CM | POA: Diagnosis not present

## 2015-08-10 DIAGNOSIS — R0989 Other specified symptoms and signs involving the circulatory and respiratory systems: Secondary | ICD-10-CM

## 2015-08-10 DIAGNOSIS — R1011 Right upper quadrant pain: Secondary | ICD-10-CM

## 2015-08-10 NOTE — Progress Notes (Signed)
Patient ID: Lisa Blackwell, female   DOB: [birth year], 82 y.o.   MRN: [id number]   Subjective:    Patient ID: TESSI SWEETING, female    DOB: [birth year], 82 y.o.   MRN: [id number]  HPI  Patient here for a scheduled follow up.  She was seen last week.  Persistent cough and clearing of her throat.  cxr revealed question of nodular density.  We discussed obtaining chest ct.  She is in agreement.  She also describes persistent right side and right upper quadrant discomfort.  Is intermittent.  No nausea or vomiting.  Has had weight loss.  No significant bowel change.  No chest pain.  No increased sob.     Past Medical History  Diagnosis Date  . History of chicken pox   . Heart murmur   . Hypertension   . Compression fracture     Spine  . Hx of colonic polyp   . Internal hemorrhoid    Past Surgical History  Procedure Laterality Date  . Appendectomy  1957  . Hemorrhoid banding  March 2015    interal rubber band ligation   Family History  Problem Relation Age of Onset  . Breast cancer Mother   . Arthritis Mother   . Colon cancer Father   . Stroke Father   . Heart disease Maternal Grandmother   . Heart disease Paternal Grandmother    Social History   Social History  . Marital Status: Married    Spouse Name: N/A  . Number of Children: 1  . Years of Education: N/A   Social History Main Topics  . Smoking status: Never Smoker   . Smokeless tobacco: Never Used  . Alcohol Use: No  . Drug Use: No  . Sexual Activity: Not Asked   Other Topics Concern  . None   Social History Narrative     Review of Systems  Constitutional: Negative for fever.       Has had some decrease in weight.   HENT: Positive for congestion. Negative for sinus pressure.        Throat clearing.    Respiratory: Positive for cough. Negative for chest tightness and shortness of breath.   Cardiovascular: Negative for chest pain, palpitations and leg swelling.  Gastrointestinal: Positive for abdominal  pain. Negative for nausea, vomiting and diarrhea.  Genitourinary: Negative for dysuria and difficulty urinating.  Musculoskeletal: Negative for myalgias and joint swelling.  Skin: Negative for color change and rash.  Neurological: Negative for dizziness, light-headedness and headaches.  Psychiatric/Behavioral: Negative for dysphoric mood and agitation.       Objective:    Physical Exam  Constitutional: She appears well-developed and well-nourished. No distress.  HENT:  Nose: Nose normal.  Mouth/Throat: Oropharynx is clear and moist.  Neck: Neck supple. No thyromegaly present.  Cardiovascular: Normal rate and regular rhythm.   Pulmonary/Chest: Breath sounds normal. No respiratory distress. She has no wheezes.  Abdominal: Soft. Bowel sounds are normal.  Minimal fullness RUQ.    Musculoskeletal: She exhibits no edema or tenderness.  Lymphadenopathy:    She has no cervical adenopathy.  Skin: No rash noted. No erythema.  Psychiatric: She has a normal mood and affect. Her behavior is normal.    BP 130/72 mmHg  Pulse 63  Temp(Src) 98.3 F (36.8 C) (Oral)  Resp 14  Ht 5\' 2"  (1.575 m)  Wt 132 lb 3.2 oz (59.966 kg)  BMI 24.17 kg/m2  SpO2 96% Wt Readings from Last 3 Encounters:  08/10/15 132 lb 3.2 oz (59.966 kg)  08/03/15 133 lb 4 oz (60.442 kg)  03/31/15 135 lb 8 oz (61.462 kg)     Lab Results  Component Value Date   WBC 4.6 11/07/2014   HGB 13.4 11/07/2014   HCT 40.0 11/07/2014   PLT 176.0 11/07/2014   GLUCOSE 109* 08/03/2015   CHOL 185 08/15/2014   TRIG 114.0 08/15/2014   HDL 70.70 08/15/2014   LDLCALC 92 08/15/2014   ALT 9 08/03/2015   AST 16 08/03/2015   NA 141 08/03/2015   K 4.4 08/03/2015   CL 106 08/03/2015   CREATININE 0.75 08/03/2015   BUN 21 08/03/2015   CO2 31 08/03/2015   TSH 1.12 08/03/2015    Dg Chest 2 View  08/07/2015  CLINICAL DATA:  Cough and congestion for several months EXAM: CHEST  2 VIEW COMPARISON:  None. FINDINGS: The cardiac shadow is  at the upper limits of normal in size. The lungs are well aerated bilaterally. No focal infiltrate is seen. Chronic compression deformities are noted in the thoracic spine. There is a questionable nodular density identified projecting in the right upper lobe just above the minor fissure. This may be related to a confluence of vascular and parenchymal shadows. IMPRESSION: Questionable nodular density as described. This is best seen on the lateral projection. Noncontrast CT is recommended to rule out underlying lesion. These results will be called to the ordering clinician or representative by the Radiologist Assistant, and communication documented in the PACS or zVision Dashboard. Electronically Signed   By: Inez Catalina M.D.   On: 08/07/2015 16:14       Assessment & Plan:   Problem List Items Addressed This Visit    Essential hypertension, benign    Blood pressure under good control.  Continue same medication regimen.  Follow pressures.  Follow metabolic panel.        Loss of weight    She is eating.  Weight down some.  Follow.  Obtain CT scans as outlined.        RUQ pain    Persistent pain.  Notices more now.  Is concerned about persistent intermittent discomfort.  Has lost weight.  Some bloating she has noticed previously.  Has had ultrasound - unrevealing.  Will obtain CT abdomen give persistent pain, weight loss and fullness noted on exam.  Need to confirm no mass lesion, colon inflammation (colitis, diverticulitis, etc).        Throat clearing    Persistent cough and throat clearing.  Has tried nasal sprays, antihistamines and zantac.  Referred to ENT.  W/up unrevealing.  CXR with question of nodular density.  Obtain chest CT.         Other Visit Diagnoses    Abnormal CXR    -  Primary    Relevant Orders    CT Chest W Contrast    Right upper quadrant pain        Relevant Orders    CT Abdomen W Contrast        Einar Pheasant, MD

## 2015-08-10 NOTE — Progress Notes (Signed)
Pre visit review using our clinic review tool, if applicable. No additional management support is needed unless otherwise documented below in the visit note. 

## 2015-08-11 ENCOUNTER — Encounter: Payer: Self-pay | Admitting: Internal Medicine

## 2015-08-11 NOTE — Assessment & Plan Note (Signed)
She is eating.  Weight down some.  Follow.  Obtain CT scans as outlined.

## 2015-08-11 NOTE — Assessment & Plan Note (Signed)
Persistent pain.  Notices more now.  Is concerned about persistent intermittent discomfort.  Has lost weight.  Some bloating she has noticed previously.  Has had ultrasound - unrevealing.  Will obtain CT abdomen give persistent pain, weight loss and fullness noted on exam.  Need to confirm no mass lesion, colon inflammation (colitis, diverticulitis, etc).

## 2015-08-11 NOTE — Assessment & Plan Note (Signed)
Blood pressure under good control.  Continue same medication regimen.  Follow pressures.  Follow metabolic panel.   

## 2015-08-11 NOTE — Assessment & Plan Note (Signed)
Persistent cough and throat clearing.  Has tried nasal sprays, antihistamines and zantac.  Referred to ENT.  W/up unrevealing.  CXR with question of nodular density.  Obtain chest CT.

## 2015-08-15 ENCOUNTER — Ambulatory Visit
Admission: RE | Admit: 2015-08-15 | Discharge: 2015-08-15 | Disposition: A | Discharge status: Home / Self Care | Payer: BC Managed Care – PPO | Source: Ambulatory Visit | Attending: Internal Medicine | Admitting: Internal Medicine

## 2015-08-15 DIAGNOSIS — R9389 Abnormal findings on diagnostic imaging of other specified body structures: Secondary | ICD-10-CM

## 2015-08-15 DIAGNOSIS — R1011 Right upper quadrant pain: Secondary | ICD-10-CM

## 2015-08-17 ENCOUNTER — Other Ambulatory Visit: Payer: Self-pay | Admitting: Internal Medicine

## 2015-08-17 NOTE — Telephone Encounter (Signed)
Please advise refill.  Thanks 

## 2015-08-18 NOTE — Telephone Encounter (Signed)
ok'd refill for valtrex #60 with one refill.

## 2015-08-21 ENCOUNTER — Ambulatory Visit
Admission: RE | Admit: 2015-08-21 | Discharge: 2015-08-21 | Disposition: A | Discharge status: Home / Self Care | Payer: Medicare Other | Source: Ambulatory Visit | Attending: Internal Medicine | Admitting: Internal Medicine

## 2015-08-21 DIAGNOSIS — R1011 Right upper quadrant pain: Secondary | ICD-10-CM | POA: Insufficient documentation

## 2015-08-21 DIAGNOSIS — R938 Abnormal findings on diagnostic imaging of other specified body structures: Secondary | ICD-10-CM | POA: Diagnosis not present

## 2015-08-21 DIAGNOSIS — I708 Atherosclerosis of other arteries: Secondary | ICD-10-CM | POA: Diagnosis not present

## 2015-08-21 DIAGNOSIS — K769 Liver disease, unspecified: Secondary | ICD-10-CM | POA: Diagnosis not present

## 2015-08-21 DIAGNOSIS — R918 Other nonspecific abnormal finding of lung field: Secondary | ICD-10-CM | POA: Diagnosis not present

## 2015-08-21 MED ORDER — IOHEXOL 300 MG/ML  SOLN
85.0000 mL | Freq: Once | INTRAMUSCULAR | Status: AC | PRN
  Administered 2015-08-21: 85 mL via INTRAVENOUS

## 2015-08-22 ENCOUNTER — Encounter: Payer: Self-pay | Admitting: Internal Medicine

## 2015-08-25 ENCOUNTER — Encounter: Payer: Self-pay | Admitting: Internal Medicine

## 2015-08-25 ENCOUNTER — Other Ambulatory Visit: Payer: Self-pay | Admitting: Internal Medicine

## 2015-08-26 ENCOUNTER — Other Ambulatory Visit: Payer: Self-pay | Admitting: Internal Medicine

## 2015-08-26 DIAGNOSIS — R9389 Abnormal findings on diagnostic imaging of other specified body structures: Secondary | ICD-10-CM

## 2015-08-26 DIAGNOSIS — R935 Abnormal findings on diagnostic imaging of other abdominal regions, including retroperitoneum: Secondary | ICD-10-CM

## 2015-08-26 NOTE — Progress Notes (Signed)
Order placed for referral to oncology

## 2015-08-26 NOTE — Telephone Encounter (Signed)
-----   Message from Lieutenant Diego, RN sent at 08/25/2015 12:18 PM EST ----- Regarding: RE: review scan Hey Dr. Nicki Reaper, I had both Dr. Genevive Bi and Rogue Bussing review the scans. They both recommended for her to see medical oncology and if she is likely to be a candidate/ interested in treatment, due to her age, then proceed with a PET scan for decision making for biopsy. I will be happy to obtain a medical oncology appt for her if you would like. Shawn ----- Message -----    From: Einar Pheasant, MD    Sent: 08/23/2015  10:29 AM      To: Lieutenant Diego, RN Subject: review scan                                    Will you have someone review her scan for me.  This pt has had a persistent cough with negative w/up previously.  Also, some abdominal discomfort and fullness.  CT scan as outlined.  Let me know what would be the next best step.  Thanks.    Dr Nicki Reaper

## 2015-08-26 NOTE — Telephone Encounter (Signed)
Called pt and notified of CT results.  Dr Faith Rogue and Dr B reviewed.  Recommended oncology referral.  Order placed for referral.  Pt aware and agrees.

## 2015-09-01 ENCOUNTER — Inpatient Hospital Stay: Payer: Medicare Other

## 2015-09-01 ENCOUNTER — Telehealth: Payer: Self-pay | Admitting: *Deleted

## 2015-09-01 ENCOUNTER — Other Ambulatory Visit: Payer: Self-pay | Admitting: *Deleted

## 2015-09-01 ENCOUNTER — Inpatient Hospital Stay: Payer: Medicare Other | Attending: Internal Medicine | Admitting: Internal Medicine

## 2015-09-01 VITALS — BP 148/73 | HR 69 | Temp 96.6°F | Resp 18 | Wt 132.7 lb

## 2015-09-01 DIAGNOSIS — R918 Other nonspecific abnormal finding of lung field: Secondary | ICD-10-CM | POA: Insufficient documentation

## 2015-09-01 DIAGNOSIS — Z7982 Long term (current) use of aspirin: Secondary | ICD-10-CM | POA: Diagnosis not present

## 2015-09-01 DIAGNOSIS — R05 Cough: Secondary | ICD-10-CM | POA: Diagnosis not present

## 2015-09-01 DIAGNOSIS — K7689 Other specified diseases of liver: Secondary | ICD-10-CM | POA: Diagnosis not present

## 2015-09-01 DIAGNOSIS — Z79899 Other long term (current) drug therapy: Secondary | ICD-10-CM | POA: Diagnosis not present

## 2015-09-01 DIAGNOSIS — Z86010 Personal history of colonic polyps: Secondary | ICD-10-CM | POA: Diagnosis not present

## 2015-09-01 DIAGNOSIS — I10 Essential (primary) hypertension: Secondary | ICD-10-CM | POA: Diagnosis not present

## 2015-09-01 LAB — COMPREHENSIVE METABOLIC PANEL
ALBUMIN: 4 g/dL (ref 3.5–5.0)
ALT: 13 U/L — ABNORMAL LOW (ref 14–54)
ANION GAP: 5 (ref 5–15)
AST: 22 U/L (ref 15–41)
Alkaline Phosphatase: 91 U/L (ref 38–126)
BUN: 24 mg/dL — ABNORMAL HIGH (ref 6–20)
CHLORIDE: 102 mmol/L (ref 101–111)
CO2: 28 mmol/L (ref 22–32)
CREATININE: 0.9 mg/dL (ref 0.44–1.00)
Calcium: 9.1 mg/dL (ref 8.9–10.3)
GFR, EST NON AFRICAN AMERICAN: 56 mL/min — AB (ref >60)
Glucose, Bld: 100 mg/dL — ABNORMAL HIGH (ref 65–99)
POTASSIUM: 4 mmol/L (ref 3.5–5.1)
SODIUM: 135 mmol/L (ref 135–145)
Total Bilirubin: 0.6 mg/dL (ref 0.3–1.2)
Total Protein: 6.8 g/dL (ref 6.5–8.1)

## 2015-09-01 LAB — CBC WITH DIFFERENTIAL/PLATELET
BASOS PCT: 1 %
Basophils Absolute: 0 10*3/uL (ref 0–0.1)
Eosinophils Absolute: 0 10*3/uL (ref 0–0.7)
Eosinophils Relative: 1 %
HCT: 42.6 % (ref 35.0–47.0)
HEMOGLOBIN: 14.3 g/dL (ref 12.0–16.0)
Lymphocytes Relative: 24 %
Lymphs Abs: 1.4 10*3/uL (ref 1.0–3.6)
MCH: 29.3 pg (ref 26.0–34.0)
MCHC: 33.6 g/dL (ref 32.0–36.0)
MCV: 87.3 fL (ref 80.0–100.0)
MONOS PCT: 7 %
Monocytes Absolute: 0.4 10*3/uL (ref 0.2–0.9)
NEUTROS ABS: 3.9 10*3/uL (ref 1.4–6.5)
NEUTROS PCT: 67 %
Platelets: 164 10*3/uL (ref 150–440)
RBC: 4.88 MIL/uL (ref 3.80–5.20)
RDW: 13.7 % (ref 11.5–14.5)
WBC: 5.7 10*3/uL (ref 3.6–11.0)

## 2015-09-01 LAB — LACTATE DEHYDROGENASE: LDH: 173 U/L (ref 98–192)

## 2015-09-01 LAB — C-REACTIVE PROTEIN

## 2015-09-01 NOTE — Progress Notes (Signed)
[facility] CONSULT NOTE  Patient Care Team: Einar Pheasant, MD as PCP - General (Internal Medicine)  CHIEF COMPLAINTS/PURPOSE OF CONSULTATION:   # MARCH 2017- BILATERAL LUNG NODULES/ Liver cysts? [cough/abdominal bloating ]  # Colonoscopy- jan 2014   HISTORY OF PRESENTING ILLNESS:  Lisa Blackwell 82 y.o.  female  Patient  Who is fairly healthy/ active for age-  Noted to have cough all the last 1-2 months.  Dry cough;  Denies any unusual shortness of breath or chest pain.   This led to a chest x-ray/  Which for the led  To a CT scan that showed bilateral  Lung nodules;  And also cysts in the liver.   Patient states that she had Lost weight unintentional ~10 pounds/ appx 4 years ago. Never gained the weight.  Otherwise no recent weight loss.  No nausea no vomiting.  However she has a bloated feeling the abdomen.  Patient denies any blood in stools black colored stools. Denies any vaginal bleeding.    Patient had a last mammogram in January 2016 that was normal. The last colonoscopy was in January 2014.  ROS: A complete 10 point review of system is done which is negative except mentioned above in history of present illness  MEDICAL HISTORY:  Past Medical History  Diagnosis Date  . History of chicken pox   . Heart murmur   . Hypertension   . Compression fracture     Spine  . Hx of colonic polyp   . Internal hemorrhoid     SURGICAL HISTORY: Past Surgical History  Procedure Laterality Date  . Appendectomy  1957  . 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  She lives with her husband at home.  She still drives.  Social History   Social History  . Marital Status: Married    Spouse Name: N/A  . Number of Children: 1  . Years of Education: N/A   Occupational History  . Not on file.   Social History Main Topics  . Smoking status: Never Smoker   . Smokeless tobacco: Never Used   . Alcohol Use: No  . Drug Use: No  . Sexual Activity: Not on file   Other Topics Concern  . Not on file   Social History Narrative    FAMILY HISTORY: Family History  Problem Relation Age of Onset  . Breast cancer Mother   . Arthritis Mother   . Colon cancer Father   . Stroke Father   . Heart disease Maternal Grandmother   . Heart disease Paternal Grandmother     ALLERGIES:  has No Known Allergies.  MEDICATIONS:  Current Outpatient Prescriptions  Medication Sig Dispense Refill  . aspirin EC 81 MG tablet Take 81 mg by mouth daily.    . calcium citrate-vitamin D (CITRACAL+D) 315-200 MG-UNIT per tablet Take 1 tablet by mouth daily.    . Cholecalciferol (VITAMIN D) 2000 UNITS tablet Take 2,000 Units by mouth daily.    Marland Kitchen ipratropium (ATROVENT) 0.03 % nasal spray Place 2 sprays into the nose every 12 (twelve) hours.    . magnesium oxide (MAG-OX) 400 MG tablet Take 1 tablet (400 mg total) by mouth daily. 30 tablet 5  . Probiotic Product (ALIGN PO) Take by mouth daily.    . raloxifene (EVISTA) 60 MG tablet take 1 tablet by mouth daily 30 tablet 11  . telmisartan (MICARDIS) 20  MG tablet take 1 tablet by mouth once daily 30 tablet 2  . valACYclovir (VALTREX) 500 MG tablet take 1 tablet by mouth twice a day if needed 60 tablet 1   No current facility-administered medications for this visit.      Marland Kitchen  PHYSICAL EXAMINATION: ECOG PERFORMANCE STATUS: 1 - Symptomatic but completely ambulatory  Filed Vitals:   09/01/15 1030  BP: 148/73  Pulse: 69  Temp: 96.6 F (35.9 C)  Resp: 18   Filed Weights   09/01/15 1030  Weight: 132 lb 11.5 oz (60.2 kg)    GENERAL: Well-nourished well-developed; Alert, no distress and comfortable.  Alone.  Looked much younger for her stated age. EYES: no pallor or icterus OROPHARYNX: no thrush or ulceration; good dentition  NECK: supple, no masses felt LYMPH:  no palpable lymphadenopathy in the cervical, axillary or inguinal regions LUNGS: clear  to auscultation and  No wheeze or crackles HEART/CVS: regular rate & rhythm and no murmurs; No lower extremity edema ABDOMEN: abdomen soft, non-tender and normal bowel sounds Musculoskeletal:no cyanosis of digits and no clubbing  PSYCH: alert & oriented x 3 with fluent speech NEURO: no focal motor/sensory deficits SKIN:  no rashes or significant lesions Right and left BREAST exam [in the presence of nurse]- no unusual skin changes or dominant masses felt.    LABORATORY DATA:  I have reviewed the data as listed Lab Results  Component Value Date   WBC 4.6 11/07/2014   HGB 13.4 11/07/2014   HCT 40.0 11/07/2014   MCV 88.5 11/07/2014   PLT 176.0 11/07/2014    Recent Labs  11/07/14 0938 02/07/15 0842 08/03/15 1433  NA 139 139 141  K 3.8 4.3 4.4  CL 104 104 106  CO2 31 31 31   GLUCOSE 88 83 109*  BUN 18 22 21   CREATININE 0.81 0.89 0.75  CALCIUM 9.6 9.3 9.1  PROT  --   --  5.9*  ALBUMIN  --   --  3.6  AST  --   --  16  ALT  --   --  9  ALKPHOS  --   --  93  BILITOT  --   --  0.4    RADIOGRAPHIC STUDIES: I have personally reviewed the radiological images as listed and agreed with the findings in the report. Dg Chest 2 View  08/07/2015  CLINICAL DATA:  Cough and congestion for several months EXAM: CHEST  2 VIEW COMPARISON:  None. FINDINGS: The cardiac shadow is at the upper limits of normal in size. The lungs are well aerated bilaterally. No focal infiltrate is seen. Chronic compression deformities are noted in the thoracic spine. There is a questionable nodular density identified projecting in the right upper lobe just above the minor fissure. This may be related to a confluence of vascular and parenchymal shadows. IMPRESSION: Questionable nodular density as described. This is best seen on the lateral projection. Noncontrast CT is recommended to rule out underlying lesion. These results will be called to the ordering clinician or representative by the Radiologist Assistant, and  communication documented in the PACS or zVision Dashboard. Electronically Signed   By: Inez Catalina M.D.   On: 08/07/2015 16:14   Ct Chest W Contrast  08/21/2015  CLINICAL DATA:  82 year old female with history of nonproductive cough. EXAM: CT CHEST AND ABDOMEN WITH CONTRAST TECHNIQUE: Multidetector CT imaging of the chest and abdomen was performed following the standard protocol during bolus administration of intravenous contrast. CONTRAST:  44mL OMNIPAQUE IOHEXOL 300  MG/ML  SOLN COMPARISON:  No priors. FINDINGS: CT CHEST FINDINGS Mediastinum/Lymph Nodes: Heart size is normal. There is no significant pericardial fluid, thickening or pericardial calcification. Thickening of the aortic valve. No pathologically enlarged mediastinal are hilar lymph nodes. Please note that accurate exclusion of hilar adenopathy is limited on noncontrast CT scans. Small to moderate hiatal hernia. No axillary lymphadenopathy. Lungs/Pleura: There are several pulmonary nodules scattered throughout the lungs bilaterally. The largest of these include an 11 x 9 mm nodule in the medial aspect of the left upper lobe near the apex (image 12 of series 4), and an 11 x 9 mm nodule in the inferior aspect of the right upper lobe shortly above the minor fissure (image 28 of series 4). Other nodules are generally 2-4 mm in size, with exception of a 7 mm subpleural nodule in the inferior aspect of the left lower lobe (image 39 of series 4). Linear area of scarring in the lateral segment of the right middle lobe. No acute consolidative airspace disease. No pleural effusions. Musculoskeletal/Soft Tissues: Old compression fractures of T9 and T12 are noted, most severe at T9 where there is nearly a vertebra plana appearance. At T12 there is approximately 50% loss of anterior vertebral body height. These findings appear similar to prior MRI of the thoracic spine 04/03/2012. There are no aggressive appearing lytic or blastic lesions noted in the visualized  portions of the skeleton. CT ABDOMEN FINDINGS Hepatobiliary: Several scattered sub cm low-attenuation lesions are noted throughout the liver, too small to definitively characterize, but they majority these are favored to represent tiny simple cysts. There is also 1 cm low-attenuation lesions in the caudate lobe and in segment 2 of the liver compatible with simple cysts. Some hepatic lesions are slightly more ill-defined, the largest of which is in the periphery of segment 8 (image 49 of series 2). No intra or extrahepatic biliary ductal dilatation. Gallbladder is normal in appearance. Pancreas: No pancreatic mass. No pancreatic ductal dilatation. No pancreatic or peripancreatic fluid or inflammatory changes. Spleen: Unremarkable. Adrenals/Urinary Tract: Bilateral adrenal glands and bilateral kidneys are normal in appearance. No hydroureteronephrosis in the visualized abdomen. Stomach/Bowel: Normal appearance of the intra abdominal portion of the stomach. No pathologic dilatation of visualized portions of small bowel or colon. Vascular/Lymphatic: Atherosclerosis in the abdominal vasculature, without evidence of aneurysm or dissection. No lymphadenopathy noted in the abdomen. Other: No significant volume of ascites and no pneumoperitoneum in the visualized peritoneal cavity. Musculoskeletal: There are no aggressive appearing lytic or blastic lesions noted in the visualized portions of the skeleton. IMPRESSION: 1. Multiple pulmonary nodules in lungs bilaterally are nonspecific, but the possibility of metastatic disease to the lungs is not excluded. 2. In addition, there are multiple liver lesions. The majority of these are too small to definitively characterize, but favored to represent cysts. However, one lesion in the periphery of segment 8 of the liver is considered indeterminate (image 49 of series 2) measuring 12 x 8 mm. 3. For further evaluation of the above findings, correlation with PET-CT is suggested in the  near future. No definite primary malignancy is confidently identified in the chest or abdomen at this time. 4. Thickening of the aortic valve. Echocardiographic correlation for underlying valvular dysfunction may be warranted if clinically appropriate. 5. Atherosclerosis. 6. Additional incidental findings, as above. Electronically Signed   By: Vinnie Langton M.D.   On: 08/21/2015 10:54   Ct Abdomen W Contrast  08/21/2015  CLINICAL DATA:  82 year old female with history of nonproductive  cough. EXAM: CT CHEST AND ABDOMEN WITH CONTRAST TECHNIQUE: Multidetector CT imaging of the chest and abdomen was performed following the standard protocol during bolus administration of intravenous contrast. CONTRAST:  21mL OMNIPAQUE IOHEXOL 300 MG/ML  SOLN COMPARISON:  No priors. FINDINGS: CT CHEST FINDINGS Mediastinum/Lymph Nodes: Heart size is normal. There is no significant pericardial fluid, thickening or pericardial calcification. Thickening of the aortic valve. No pathologically enlarged mediastinal are hilar lymph nodes. Please note that accurate exclusion of hilar adenopathy is limited on noncontrast CT scans. Small to moderate hiatal hernia. No axillary lymphadenopathy. Lungs/Pleura: There are several pulmonary nodules scattered throughout the lungs bilaterally. The largest of these include an 11 x 9 mm nodule in the medial aspect of the left upper lobe near the apex (image 12 of series 4), and an 11 x 9 mm nodule in the inferior aspect of the right upper lobe shortly above the minor fissure (image 28 of series 4). Other nodules are generally 2-4 mm in size, with exception of a 7 mm subpleural nodule in the inferior aspect of the left lower lobe (image 39 of series 4). Linear area of scarring in the lateral segment of the right middle lobe. No acute consolidative airspace disease. No pleural effusions. Musculoskeletal/Soft Tissues: Old compression fractures of T9 and T12 are noted, most severe at T9 where there is  nearly a vertebra plana appearance. At T12 there is approximately 50% loss of anterior vertebral body height. These findings appear similar to prior MRI of the thoracic spine 04/03/2012. There are no aggressive appearing lytic or blastic lesions noted in the visualized portions of the skeleton. CT ABDOMEN FINDINGS Hepatobiliary: Several scattered sub cm low-attenuation lesions are noted throughout the liver, too small to definitively characterize, but they majority these are favored to represent tiny simple cysts. There is also 1 cm low-attenuation lesions in the caudate lobe and in segment 2 of the liver compatible with simple cysts. Some hepatic lesions are slightly more ill-defined, the largest of which is in the periphery of segment 8 (image 49 of series 2). No intra or extrahepatic biliary ductal dilatation. Gallbladder is normal in appearance. Pancreas: No pancreatic mass. No pancreatic ductal dilatation. No pancreatic or peripancreatic fluid or inflammatory changes. Spleen: Unremarkable. Adrenals/Urinary Tract: Bilateral adrenal glands and bilateral kidneys are normal in appearance. No hydroureteronephrosis in the visualized abdomen. Stomach/Bowel: Normal appearance of the intra abdominal portion of the stomach. No pathologic dilatation of visualized portions of small bowel or colon. Vascular/Lymphatic: Atherosclerosis in the abdominal vasculature, without evidence of aneurysm or dissection. No lymphadenopathy noted in the abdomen. Other: No significant volume of ascites and no pneumoperitoneum in the visualized peritoneal cavity. Musculoskeletal: There are no aggressive appearing lytic or blastic lesions noted in the visualized portions of the skeleton. IMPRESSION: 1. Multiple pulmonary nodules in lungs bilaterally are nonspecific, but the possibility of metastatic disease to the lungs is not excluded. 2. In addition, there are multiple liver lesions. The majority of these are too small to definitively  characterize, but favored to represent cysts. However, one lesion in the periphery of segment 8 of the liver is considered indeterminate (image 49 of series 2) measuring 12 x 8 mm. 3. For further evaluation of the above findings, correlation with PET-CT is suggested in the near future. No definite primary malignancy is confidently identified in the chest or abdomen at this time. 4. Thickening of the aortic valve. Echocardiographic correlation for underlying valvular dysfunction may be warranted if clinically appropriate. 5. Atherosclerosis. 6. Additional incidental  findings, as above. Electronically Signed   By: Vinnie Langton M.D.   On: 08/21/2015 10:54    ASSESSMENT & PLAN:   # BILATERAL LUNG NODULES-  Unclear etiology.  Suspicious for metastatic disease.  Check CBC CMP LDH-  Tumor markers-  CEA;  CA-19-9 CEA 125.  Discussed with the patient that  Only way to confirm/ diagnosed malignancy is  Biopsy.  I would recommend a PET scan for further evaluation.   #  Cough-  Unclear etiology;  Question related to the bilateral lung nodules.  I would recommend pulmonary evaluation. The patient's case was also discussed at the tumor conference yesterday.   Recommend referral to Dr. Mortimer Fries.   #  Patient will follow-up with me in approximately 2 weeks or sooner based upon the results of the scans lab work.  Thank you Dr. for allowing me to participate in the care of your pleasant patient. Please do not hesitate to contact me with questions or concerns in the interim.  # 45 minutes face-to-face with the patient discussing the above plan of care; more than 50% of time spent on counseling and coordination.     Cammie Sickle, MD 09/01/2015 11:03 AM

## 2015-09-01 NOTE — Telephone Encounter (Signed)
Called [facility] to make ref. Due to pt cough and review of scan with lung nodules. Is the  Cough coming from lung nodules or a pulm. Disease.  Is there a nodule that can be bx.  She will talk to [facility] on Monday and call me back

## 2015-09-01 NOTE — Progress Notes (Signed)
Patient here today as new evaluation regarding lung nodule.  Referred by Dr. Einar Pheasant.

## 2015-09-02 LAB — CANCER ANTIGEN 27.29: CA 27.29: 30.2 U/mL (ref 0.0–38.6)

## 2015-09-02 LAB — CANCER ANTIGEN 19-9: CA 19 9: 21 U/mL (ref 0–35)

## 2015-09-02 LAB — CEA: CEA: 4.4 ng/mL (ref 0.0–4.7)

## 2015-09-02 LAB — MISC LABCORP TEST (SEND OUT)
LABCORP TEST CODE: 3
LABCORP TEST NAME: 143404

## 2015-09-02 LAB — CA 125: CA 125: 4.5 U/mL (ref 0.0–38.1)

## 2015-09-07 ENCOUNTER — Ambulatory Visit
Admission: RE | Admit: 2015-09-07 | Discharge: 2015-09-07 | Disposition: A | Discharge status: Home / Self Care | Payer: Medicare Other | Source: Ambulatory Visit | Attending: Internal Medicine | Admitting: Internal Medicine

## 2015-09-07 DIAGNOSIS — K769 Liver disease, unspecified: Secondary | ICD-10-CM | POA: Diagnosis not present

## 2015-09-07 DIAGNOSIS — R918 Other nonspecific abnormal finding of lung field: Secondary | ICD-10-CM

## 2015-09-07 LAB — GLUCOSE, CAPILLARY: GLUCOSE-CAPILLARY: 83 mg/dL (ref 65–99)

## 2015-09-07 MED ORDER — FLUDEOXYGLUCOSE F - 18 (FDG) INJECTION
13.1300 | Freq: Once | INTRAVENOUS | Status: AC | PRN
  Administered 2015-09-07: 13.13 via INTRAVENOUS

## 2015-09-14 ENCOUNTER — Telehealth: Payer: Self-pay | Admitting: Internal Medicine

## 2015-09-14 NOTE — Telephone Encounter (Signed)
Reviewed the images with the radiologist at the tumor conference. Likely atelectasis/cannot rule out malignancy- recommend pulmonary evaluation. Discussed with Lisa Blackwell. He will contact Pulmonary.

## 2015-09-14 NOTE — Telephone Encounter (Signed)
-----   Message from Lieutenant Diego, RN sent at 08/25/2015  8:27 AM EST ----- Regarding: FW: review scan Dr. Jacinto Reap, can you please review this and give me or Dr. Nicki Reaper some direction? Thanks, Shawn  ----- Message -----    From: Einar Pheasant, MD    Sent: 08/23/2015  10:29 AM      To: Lieutenant Diego, RN Subject: review scan                                    Will you have someone review her scan for me.  This pt has had a persistent cough with negative w/up previously.  Also, some abdominal discomfort and fullness.  CT scan as outlined.  Let me know what would be the next best step.  Thanks.    Dr Nicki Reaper

## 2015-09-15 ENCOUNTER — Encounter: Payer: Self-pay | Admitting: Internal Medicine

## 2015-09-15 ENCOUNTER — Inpatient Hospital Stay (HOSPITAL_BASED_OUTPATIENT_CLINIC_OR_DEPARTMENT_OTHER): Payer: Medicare Other | Admitting: Internal Medicine

## 2015-09-15 VITALS — BP 137/73 | HR 67 | Temp 98.4°F | Resp 18 | Ht 62.0 in | Wt 130.0 lb

## 2015-09-15 DIAGNOSIS — R918 Other nonspecific abnormal finding of lung field: Secondary | ICD-10-CM

## 2015-09-15 DIAGNOSIS — I10 Essential (primary) hypertension: Secondary | ICD-10-CM | POA: Diagnosis not present

## 2015-09-15 DIAGNOSIS — Z86010 Personal history of colonic polyps: Secondary | ICD-10-CM | POA: Diagnosis not present

## 2015-09-15 DIAGNOSIS — K7689 Other specified diseases of liver: Secondary | ICD-10-CM | POA: Diagnosis not present

## 2015-09-15 DIAGNOSIS — Z79899 Other long term (current) drug therapy: Secondary | ICD-10-CM

## 2015-09-15 DIAGNOSIS — R05 Cough: Secondary | ICD-10-CM | POA: Diagnosis not present

## 2015-09-15 NOTE — Progress Notes (Signed)
[facility] CONSULT NOTE  Patient Care Team: Einar Pheasant, MD as PCP - General (Internal Medicine)  CHIEF COMPLAINTS/PURPOSE OF CONSULTATION:   # MARCH 2017- BILATERAL LUNG NODULES/ Liver cysts? [cough/abdominal bloating ]; PET avid- suspicious of malignancy; tumor marker-N.   # Colonoscopy- jan 2014/mammogram in January 2016 that was normal   HISTORY OF PRESENTING ILLNESS:  Lisa Blackwell 82 y.o.  female  Patient  Who is fairly healthy/ active for age-  Noted to have cough all the last 1-2 months.  Dry cough; she continues to have cough not any worse. She continues to deny  any unusual shortness of breath or chest pain.     She denies any new symptoms since last visit.  ROS: A complete 10 point review of system is done which is negative except mentioned above in history of present illness  MEDICAL HISTORY:  Past Medical History  Diagnosis Date  . History of chicken pox   . Heart murmur   . Hypertension   . Compression fracture     Spine  . Hx of colonic polyp   . Internal hemorrhoid     SURGICAL HISTORY: Past Surgical History  Procedure Laterality Date  . Appendectomy  1957  . 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  She lives with her husband at home.  She still drives.  Social History   Social History  . Marital Status: Married    Spouse Name: N/A  . Number of Children: 1  . Years of Education: N/A   Occupational History  . Not on file.   Social History Main Topics  . Smoking status: Never Smoker   . Smokeless tobacco: Never Used  . Alcohol Use: No  . Drug Use: No  . Sexual Activity: Not on file   Other Topics Concern  . Not on file   Social History Narrative    FAMILY HISTORY: Family History  Problem Relation Age of Onset  . Breast cancer Mother   . Arthritis Mother   . Colon cancer Father   . Stroke Father   . Heart disease Maternal Grandmother    . Heart disease Paternal Grandmother     ALLERGIES:  has No Known Allergies.  MEDICATIONS:  Current Outpatient Prescriptions  Medication Sig Dispense Refill  . aspirin EC 81 MG tablet Take 81 mg by mouth daily.    . calcium citrate-vitamin D (CITRACAL+D) 315-200 MG-UNIT per tablet Take 1 tablet by mouth daily.    . Cholecalciferol (VITAMIN D) 2000 UNITS tablet Take 2,000 Units by mouth daily.    . Probiotic Product (ALIGN PO) Take by mouth daily.    . raloxifene (EVISTA) 60 MG tablet take 1 tablet by mouth daily 30 tablet 11  . telmisartan (MICARDIS) 20 MG tablet take 1 tablet by mouth once daily 30 tablet 2  . valACYclovir (VALTREX) 500 MG tablet take 1 tablet by mouth twice a day if needed 60 tablet 1  . ipratropium (ATROVENT) 0.03 % nasal spray Place 2 sprays into the nose every 12 (twelve) hours.    . magnesium oxide (MAG-OX) 400 MG tablet Take 1 tablet (400 mg total) by mouth daily. (Patient taking differently: Take 400 mg by mouth daily as needed. ) 30 tablet 5   No current facility-administered medications for this visit.      Marland Kitchen  PHYSICAL EXAMINATION: ECOG PERFORMANCE STATUS: 1 - Symptomatic but  completely ambulatory  Filed Vitals:   09/15/15 0932  BP: 137/73  Pulse: 67  Temp: 98.4 F (36.9 C)  Resp: 18   Filed Weights   09/15/15 0932  Weight: 130 lb (58.968 kg)    GENERAL: Well-nourished well-developed; Alert, no distress and comfortable.  Alone.  Looked much younger for her stated age. EYES: no pallor or icterus OROPHARYNX: no thrush or ulceration; good dentition  NECK: supple, no masses felt LYMPH:  no palpable lymphadenopathy in the cervical, axillary or inguinal regions LUNGS: clear to auscultation and  No wheeze or crackles HEART/CVS: regular rate & rhythm and no murmurs; No lower extremity edema ABDOMEN: abdomen soft, non-tender and normal bowel sounds Musculoskeletal:no cyanosis of digits and no clubbing  PSYCH: alert & oriented x 3 with fluent  speech NEURO: no focal motor/sensory deficits SKIN:  no rashes or significant lesions Right and left BREAST exam [in the presence of nurse]- no unusual skin changes or dominant masses felt.    LABORATORY DATA:  I have reviewed the data as listed Lab Results  Component Value Date   WBC 5.7 09/01/2015   HGB 14.3 09/01/2015   HCT 42.6 09/01/2015   MCV 87.3 09/01/2015   PLT 164 09/01/2015    Recent Labs  02/07/15 0842 08/03/15 1433 09/01/15 1149  NA 139 141 135  K 4.3 4.4 4.0  CL 104 106 102  CO2 31 31 28   GLUCOSE 83 109* 100*  BUN 22 21 24*  CREATININE 0.89 0.75 0.90  CALCIUM 9.3 9.1 9.1  GFRNONAA  --   --  56*  GFRAA  --   --  >60  PROT  --  5.9* 6.8  ALBUMIN  --  3.6 4.0  AST  --  16 22  ALT  --  9 13*  ALKPHOS  --  93 91  BILITOT  --  0.4 0.6    RADIOGRAPHIC STUDIES: I have personally reviewed the radiological images as listed and agreed with the findings in the report. Ct Chest W Contrast  08/21/2015  CLINICAL DATA:  82 year old female with history of nonproductive cough. EXAM: CT CHEST AND ABDOMEN WITH CONTRAST TECHNIQUE: Multidetector CT imaging of the chest and abdomen was performed following the standard protocol during bolus administration of intravenous contrast. CONTRAST:  58mL OMNIPAQUE IOHEXOL 300 MG/ML  SOLN COMPARISON:  No priors. FINDINGS: CT CHEST FINDINGS Mediastinum/Lymph Nodes: Heart size is normal. There is no significant pericardial fluid, thickening or pericardial calcification. Thickening of the aortic valve. No pathologically enlarged mediastinal are hilar lymph nodes. Please note that accurate exclusion of hilar adenopathy is limited on noncontrast CT scans. Small to moderate hiatal hernia. No axillary lymphadenopathy. Lungs/Pleura: There are several pulmonary nodules scattered throughout the lungs bilaterally. The largest of these include an 11 x 9 mm nodule in the medial aspect of the left upper lobe near the apex (image 12 of series 4), and an 11 x  9 mm nodule in the inferior aspect of the right upper lobe shortly above the minor fissure (image 28 of series 4). Other nodules are generally 2-4 mm in size, with exception of a 7 mm subpleural nodule in the inferior aspect of the left lower lobe (image 39 of series 4). Linear area of scarring in the lateral segment of the right middle lobe. No acute consolidative airspace disease. No pleural effusions. Musculoskeletal/Soft Tissues: Old compression fractures of T9 and T12 are noted, most severe at T9 where there is nearly a vertebra plana appearance. At T12  there is approximately 50% loss of anterior vertebral body height. These findings appear similar to prior MRI of the thoracic spine 04/03/2012. There are no aggressive appearing lytic or blastic lesions noted in the visualized portions of the skeleton. CT ABDOMEN FINDINGS Hepatobiliary: Several scattered sub cm low-attenuation lesions are noted throughout the liver, too small to definitively characterize, but they majority these are favored to represent tiny simple cysts. There is also 1 cm low-attenuation lesions in the caudate lobe and in segment 2 of the liver compatible with simple cysts. Some hepatic lesions are slightly more ill-defined, the largest of which is in the periphery of segment 8 (image 49 of series 2). No intra or extrahepatic biliary ductal dilatation. Gallbladder is normal in appearance. Pancreas: No pancreatic mass. No pancreatic ductal dilatation. No pancreatic or peripancreatic fluid or inflammatory changes. Spleen: Unremarkable. Adrenals/Urinary Tract: Bilateral adrenal glands and bilateral kidneys are normal in appearance. No hydroureteronephrosis in the visualized abdomen. Stomach/Bowel: Normal appearance of the intra abdominal portion of the stomach. No pathologic dilatation of visualized portions of small bowel or colon. Vascular/Lymphatic: Atherosclerosis in the abdominal vasculature, without evidence of aneurysm or dissection. No  lymphadenopathy noted in the abdomen. Other: No significant volume of ascites and no pneumoperitoneum in the visualized peritoneal cavity. Musculoskeletal: There are no aggressive appearing lytic or blastic lesions noted in the visualized portions of the skeleton. IMPRESSION: 1. Multiple pulmonary nodules in lungs bilaterally are nonspecific, but the possibility of metastatic disease to the lungs is not excluded. 2. In addition, there are multiple liver lesions. The majority of these are too small to definitively characterize, but favored to represent cysts. However, one lesion in the periphery of segment 8 of the liver is considered indeterminate (image 49 of series 2) measuring 12 x 8 mm. 3. For further evaluation of the above findings, correlation with PET-CT is suggested in the near future. No definite primary malignancy is confidently identified in the chest or abdomen at this time. 4. Thickening of the aortic valve. Echocardiographic correlation for underlying valvular dysfunction may be warranted if clinically appropriate. 5. Atherosclerosis. 6. Additional incidental findings, as above. Electronically Signed   By: Vinnie Langton M.D.   On: 08/21/2015 10:54   Ct Abdomen W Contrast  08/21/2015  CLINICAL DATA:  82 year old female with history of nonproductive cough. EXAM: CT CHEST AND ABDOMEN WITH CONTRAST TECHNIQUE: Multidetector CT imaging of the chest and abdomen was performed following the standard protocol during bolus administration of intravenous contrast. CONTRAST:  84mL OMNIPAQUE IOHEXOL 300 MG/ML  SOLN COMPARISON:  No priors. FINDINGS: CT CHEST FINDINGS Mediastinum/Lymph Nodes: Heart size is normal. There is no significant pericardial fluid, thickening or pericardial calcification. Thickening of the aortic valve. No pathologically enlarged mediastinal are hilar lymph nodes. Please note that accurate exclusion of hilar adenopathy is limited on noncontrast CT scans. Small to moderate hiatal hernia.  No axillary lymphadenopathy. Lungs/Pleura: There are several pulmonary nodules scattered throughout the lungs bilaterally. The largest of these include an 11 x 9 mm nodule in the medial aspect of the left upper lobe near the apex (image 12 of series 4), and an 11 x 9 mm nodule in the inferior aspect of the right upper lobe shortly above the minor fissure (image 28 of series 4). Other nodules are generally 2-4 mm in size, with exception of a 7 mm subpleural nodule in the inferior aspect of the left lower lobe (image 39 of series 4). Linear area of scarring in the lateral segment of the right  middle lobe. No acute consolidative airspace disease. No pleural effusions. Musculoskeletal/Soft Tissues: Old compression fractures of T9 and T12 are noted, most severe at T9 where there is nearly a vertebra plana appearance. At T12 there is approximately 50% loss of anterior vertebral body height. These findings appear similar to prior MRI of the thoracic spine 04/03/2012. There are no aggressive appearing lytic or blastic lesions noted in the visualized portions of the skeleton. CT ABDOMEN FINDINGS Hepatobiliary: Several scattered sub cm low-attenuation lesions are noted throughout the liver, too small to definitively characterize, but they majority these are favored to represent tiny simple cysts. There is also 1 cm low-attenuation lesions in the caudate lobe and in segment 2 of the liver compatible with simple cysts. Some hepatic lesions are slightly more ill-defined, the largest of which is in the periphery of segment 8 (image 49 of series 2). No intra or extrahepatic biliary ductal dilatation. Gallbladder is normal in appearance. Pancreas: No pancreatic mass. No pancreatic ductal dilatation. No pancreatic or peripancreatic fluid or inflammatory changes. Spleen: Unremarkable. Adrenals/Urinary Tract: Bilateral adrenal glands and bilateral kidneys are normal in appearance. No hydroureteronephrosis in the visualized abdomen.  Stomach/Bowel: Normal appearance of the intra abdominal portion of the stomach. No pathologic dilatation of visualized portions of small bowel or colon. Vascular/Lymphatic: Atherosclerosis in the abdominal vasculature, without evidence of aneurysm or dissection. No lymphadenopathy noted in the abdomen. Other: No significant volume of ascites and no pneumoperitoneum in the visualized peritoneal cavity. Musculoskeletal: There are no aggressive appearing lytic or blastic lesions noted in the visualized portions of the skeleton. IMPRESSION: 1. Multiple pulmonary nodules in lungs bilaterally are nonspecific, but the possibility of metastatic disease to the lungs is not excluded. 2. In addition, there are multiple liver lesions. The majority of these are too small to definitively characterize, but favored to represent cysts. However, one lesion in the periphery of segment 8 of the liver is considered indeterminate (image 49 of series 2) measuring 12 x 8 mm. 3. For further evaluation of the above findings, correlation with PET-CT is suggested in the near future. No definite primary malignancy is confidently identified in the chest or abdomen at this time. 4. Thickening of the aortic valve. Echocardiographic correlation for underlying valvular dysfunction may be warranted if clinically appropriate. 5. Atherosclerosis. 6. Additional incidental findings, as above. Electronically Signed   By: Vinnie Langton M.D.   On: 08/21/2015 10:54   Nm Pet Image Initial (pi) Skull Base To Thigh  09/07/2015  CLINICAL DATA:  Initial treatment strategy for lung nodules. No primary malignancy history submitted. EXAM: NUCLEAR MEDICINE PET SKULL BASE TO THIGH TECHNIQUE: 13.1 mCi F-18 FDG was injected intravenously. Full-ring PET imaging was performed from the skull base to thigh after the radiotracer. CT data was obtained and used for attenuation correction and anatomic localization. FASTING BLOOD GLUCOSE:  Value: 83 mg/dl COMPARISON:   Chest abdomen CTs of 08/21/2015. FINDINGS: NECK No areas of abnormal hypermetabolism. CHEST Left upper lobe pulmonary nodule measures 10 mm and a S.U.V. max of 3.3 on image 63/series 3. An inferior right upper lobe pulmonary nodule measures 10 mm and a S.U.V. max of 5.3 on image 85/series 3. A relatively subtle right middle lobe pulmonary nodule measures 7 mm and a S.U.V. max of 2.0 on image 94/series 3. Also present on image 34/series 4 of the prior diagnostic study. No thoracic nodal hypermetabolism. Other pulmonary nodules are below PET resolution. ABDOMEN/PELVIS No areas of abnormal hypermetabolism. No hepatic hypermetabolism to correspond to the  indeterminate segment 8 lesion. SKELETON No abnormal marrow activity. CT IMAGES PERFORMED FOR ATTENUATION CORRECTION No cervical adenopathy. left carotid atherosclerosis. Chest and abdomen findings deferred to recent diagnostic CTs. Mild cardiomegaly with left main coronary artery atherosclerosis. No lobar consolidation. Extensive colonic diverticulosis. Multiple hepatic cysts. Osteopenia. Thoracic hemangiomas. Thoracic compression deformities including at T9 and T12 are similar. IMPRESSION: 1. Bilateral hypermetabolic pulmonary nodules. No extrathoracic primary malignancy identified. Findings are indeterminate, but could represent synchronous primary bronchogenic carcinomas. Tissue sampling should be considered. 2. No thoracic nodal hypermetabolism identified. 3. No hypermetabolism to correspond to the indeterminate segment 8 liver lesion. Favored to be benign. Electronically Signed   By: Abigail Miyamoto M.D.   On: 09/07/2015 13:03    ASSESSMENT & PLAN:   # BILATERAL LUNG NODULES-  Unclear etiology. PET scan showed uptake- concerning for synchronous primary bronchogenic carcinoma versus others. Non-malignant causes are also possible.  # Discussed strategies moving forward- biopsy needed to confirm diagnosis. Biopsy could be done through CT guidance- risk of  pneumothorax discussed. Versus bronchoscopy/ENB- if feasible by pulmonary.  # The other conservative option would be- follow-up CT scan in 3 months/ to check on the growth of the tumor. If malignancy is confirmed radiation could be recommended.  # After lengthy discussion patient wants to talk to her primary care doctor and make a decision. She is also awaiting pulmonary evaluation.  # tentative follow-up in 3 months if no biopsies done for confirmation she will need a repeat CT scan prior to next visit. This has not been ordered at this time. This was also discussed that the tumor conference on the 23rd.  # 25 minutes face-to-face with the patient discussing the above plan of care; more than 50% of time spent on counseling and coordination.     Cammie Sickle, MD 09/15/2015 9:50 AM

## 2015-09-15 NOTE — Telephone Encounter (Signed)
This is another patient that I had referred to the cancer center after review of her CT scan.  Do you need me to place the order for the referral to pulmonary.  It appears that someone has already talked to pulmonary (per previous phone message).  Just let me know what I need to do.  Thanks    [employer]

## 2015-09-15 NOTE — Progress Notes (Signed)
Pt here to get pet scan results. She still has dry cough and clears her throat often. No pain today.

## 2015-09-17 NOTE — Telephone Encounter (Signed)
Called pt.  Discussed with her.  Discussed further w/up.  She wants to make the appt to discuss the procedure and see what would be involved in doing the biopsy.  She then will be able to have more information to make the decision about the biopsy

## 2015-09-18 ENCOUNTER — Telehealth: Payer: Self-pay | Admitting: *Deleted

## 2015-09-18 NOTE — Telephone Encounter (Signed)
Asking if Dr Nicki Reaper spoke with Dr B regarding treatment and appt. Please call her back

## 2015-09-18 NOTE — Telephone Encounter (Signed)
Dr Rogue Bussing states he is planning on calling the pt

## 2015-09-18 NOTE — Telephone Encounter (Signed)
Please inform patient that I left a message for Dr. Nicki Reaper to call me. I'm awaiting a call back from her; once I hear from her- I will talk to the patient.

## 2015-09-19 ENCOUNTER — Telehealth: Payer: Self-pay | Admitting: Internal Medicine

## 2015-09-19 ENCOUNTER — Telehealth: Payer: Self-pay | Admitting: *Deleted

## 2015-09-19 NOTE — Telephone Encounter (Signed)
Spoke to Dr. Nicki Reaper; spoke to Dr. Jenell Milliner. Also spoke to the patient- that we are awaiting on evaluation of the PET scan/CAT scans by Dr. Jenell Milliner; and if ENB is possible. If not the options include- repeating a scan in 3 months/IR guided biopsy. Pt agreed.

## 2015-09-19 NOTE — Telephone Encounter (Signed)
Entered in error

## 2015-09-19 NOTE — Telephone Encounter (Signed)
Called patient and left message that Dr. Rogue Bussing is awaiting a return call from Dr. Nicki Reaper.  Once the doctors have spoken Dr. Rogue Bussing will call the patient back.  Nada Boozer, RN

## 2015-09-20 DIAGNOSIS — L578 Other skin changes due to chronic exposure to nonionizing radiation: Secondary | ICD-10-CM | POA: Diagnosis not present

## 2015-09-20 DIAGNOSIS — L570 Actinic keratosis: Secondary | ICD-10-CM | POA: Diagnosis not present

## 2015-10-01 ENCOUNTER — Encounter: Payer: Self-pay | Admitting: Internal Medicine

## 2015-10-02 ENCOUNTER — Encounter: Payer: Self-pay | Admitting: Internal Medicine

## 2015-10-02 ENCOUNTER — Ambulatory Visit (INDEPENDENT_AMBULATORY_CARE_PROVIDER_SITE_OTHER): Payer: Medicare Other | Admitting: Internal Medicine

## 2015-10-02 VITALS — BP 130/80 | HR 69 | Temp 97.6°F | Resp 17 | Ht 62.0 in | Wt 129.4 lb

## 2015-10-02 DIAGNOSIS — K625 Hemorrhage of anus and rectum: Secondary | ICD-10-CM | POA: Diagnosis not present

## 2015-10-02 DIAGNOSIS — Z1239 Encounter for other screening for malignant neoplasm of breast: Secondary | ICD-10-CM | POA: Diagnosis not present

## 2015-10-02 DIAGNOSIS — I10 Essential (primary) hypertension: Secondary | ICD-10-CM

## 2015-10-02 DIAGNOSIS — R634 Abnormal weight loss: Secondary | ICD-10-CM

## 2015-10-02 DIAGNOSIS — J069 Acute upper respiratory infection, unspecified: Secondary | ICD-10-CM | POA: Diagnosis not present

## 2015-10-02 LAB — CBC WITH DIFFERENTIAL/PLATELET
BASOS ABS: 0 10*3/uL (ref 0.0–0.1)
BASOS PCT: 0.4 % (ref 0.0–3.0)
EOS ABS: 0 10*3/uL (ref 0.0–0.7)
Eosinophils Relative: 0.4 % (ref 0.0–5.0)
HEMATOCRIT: 36.3 % (ref 36.0–46.0)
HEMOGLOBIN: 12.7 g/dL (ref 12.0–15.0)
LYMPHS PCT: 15.4 % (ref 12.0–46.0)
Lymphs Abs: 1.1 10*3/uL (ref 0.7–4.0)
MCHC: 35 g/dL (ref 30.0–36.0)
MCV: 88.7 fl (ref 78.0–100.0)
Monocytes Absolute: 0.5 10*3/uL (ref 0.1–1.0)
Monocytes Relative: 6.5 % (ref 3.0–12.0)
Neutro Abs: 5.4 10*3/uL (ref 1.4–7.7)
Neutrophils Relative %: 77.3 % — ABNORMAL HIGH (ref 43.0–77.0)
Platelets: 154 10*3/uL (ref 150.0–400.0)
RBC: 4.09 Mil/uL (ref 3.87–5.11)
RDW: 14.1 % (ref 11.5–15.5)
WBC: 7 10*3/uL (ref 4.0–10.5)

## 2015-10-02 LAB — BASIC METABOLIC PANEL
BUN: 17 mg/dL (ref 6–23)
CALCIUM: 9.2 mg/dL (ref 8.4–10.5)
CO2: 28 mEq/L (ref 19–32)
CREATININE: 0.73 mg/dL (ref 0.40–1.20)
Chloride: 105 mEq/L (ref 96–112)
GFR: 79.97 mL/min (ref >60.00)
Glucose, Bld: 93 mg/dL (ref 70–99)
Potassium: 4.3 mEq/L (ref 3.5–5.1)
Sodium: 140 mEq/L (ref 135–145)

## 2015-10-02 LAB — HEPATIC FUNCTION PANEL
ALK PHOS: 80 U/L (ref 39–117)
ALT: 15 U/L (ref 0–35)
AST: 24 U/L (ref 0–37)
Albumin: 3.6 g/dL (ref 3.5–5.2)
BILIRUBIN DIRECT: 0.2 mg/dL (ref 0.0–0.3)
TOTAL PROTEIN: 6.5 g/dL (ref 6.0–8.3)
Total Bilirubin: 0.7 mg/dL (ref 0.2–1.2)

## 2015-10-02 MED ORDER — AZITHROMYCIN 250 MG PO TABS: ORAL_TABLET | ORAL | Status: DC

## 2015-10-02 NOTE — Assessment & Plan Note (Signed)
Persistent increased cough and congestion as outlined.  Treat with zpak as directed.  Robitussin DM as directed.  Saline nasal spray and nasacort nasal spray as directed.  Follow.

## 2015-10-02 NOTE — Assessment & Plan Note (Signed)
Blood pressure on exam is 140/68.  Will continue same medication.  Follow.

## 2015-10-02 NOTE — Patient Instructions (Signed)
Saline nasal spray - flush nose at least 2-3x/day  nasacort nasal spray - 2 sprays each nostril one time per day.  Do this in the evening.    Robitussin DM twice a day as needed.  

## 2015-10-02 NOTE — Progress Notes (Signed)
Patient ID: Lisa Blackwell, female   DOB: 01/09/1926, 82 y.o.   MRN: [id number]   Subjective:    Patient ID: Lisa Blackwell, female    DOB: 1925/07/01, 82 y.o.   MRN: [id number]  HPI  Patient here as a work in with concerns regarding cold symptoms and laryngitis x 2-3 days and rectal bleeding - which started two days ago.  She reports she has been under an increased amount of stress.  Developed cold symptoms over one week ago.  Symptoms have progressed.  Reports increased nasal congestion and sinus congestion.  Some drainage.  Irritated throat.  Increased cough and congestion now.  Had no voice last week.  Still with some change in her voice.  She has noticed some decreased appetite.  Trying to drink.  Two days ago, she had a bowel movement and noticed a "gush" of blood.  Noticed minimal blood in her underpants the rest of the day.  The following day, she noticed minimal blood on the toilet paper when she wiped (after a bowel movement).  Had normal bowel movement today, no blood noticed.  Has the right side pain, but no other abdominal pain or cramping.  No diarrhea.  No vomiting.     Past Medical History  Diagnosis Date  . History of chicken pox   . Heart murmur   . Hypertension   . Compression fracture     Spine  . Hx of colonic polyp   . Internal hemorrhoid    Past Surgical History  Procedure Laterality Date  . Appendectomy  1957  . Hemorrhoid banding  March 2015    interal rubber band ligation   Family History  Problem Relation Age of Onset  . Breast cancer Mother   . Arthritis Mother   . Colon cancer Father   . Stroke Father   . Heart disease Maternal Grandmother   . Heart disease Paternal Grandmother    Social History   Social History  . Marital Status: Married    Spouse Name: N/A  . Number of Children: 1  . Years of Education: N/A   Social History Main Topics  . Smoking status: Never Smoker   . Smokeless tobacco: Never Used  . Alcohol Use: No  . Drug Use: No    . Sexual Activity: Not Asked   Other Topics Concern  . None   Social History Narrative    Outpatient Encounter Prescriptions as of 10/02/2015  Medication Sig  . aspirin EC 81 MG tablet Take 81 mg by mouth daily.  . calcium citrate-vitamin D (CITRACAL+D) 315-200 MG-UNIT per tablet Take 1 tablet by mouth daily.  . Cholecalciferol (VITAMIN D) 2000 UNITS tablet Take 2,000 Units by mouth daily.  Marland Kitchen ipratropium (ATROVENT) 0.03 % nasal spray Place 2 sprays into the nose every 12 (twelve) hours.  . magnesium oxide (MAG-OX) 400 MG tablet Take 1 tablet (400 mg total) by mouth daily. (Patient taking differently: Take 400 mg by mouth daily as needed. )  . Probiotic Product (ALIGN PO) Take by mouth daily.  . raloxifene (EVISTA) 60 MG tablet take 1 tablet by mouth daily  . telmisartan (MICARDIS) 20 MG tablet take 1 tablet by mouth once daily  . valACYclovir (VALTREX) 500 MG tablet take 1 tablet by mouth twice a day if needed  . azithromycin (ZITHROMAX) 250 MG tablet Take two tablets x 1 day and then one tablet per day for four more days.   No facility-administered encounter medications on file as  of 10/02/2015.    Review of Systems  Constitutional: Positive for appetite change. Negative for fever.  HENT: Positive for congestion, postnasal drip, sinus pressure and voice change.   Eyes: Negative for discharge and redness.  Respiratory: Positive for cough. Negative for chest tightness and shortness of breath.   Cardiovascular: Negative for chest pain, palpitations and leg swelling.  Gastrointestinal: Positive for blood in stool. Negative for nausea, vomiting and diarrhea.       Right side pain as outlined.   Genitourinary: Negative for dysuria and difficulty urinating.  Musculoskeletal: Negative for back pain and joint swelling.  Skin: Negative for color change and rash.  Neurological: Negative for dizziness, light-headedness and headaches.  Psychiatric/Behavioral: Negative for dysphoric mood and  agitation.       Objective:     Blood pressure rechecked by me;  140/68  Physical Exam  Constitutional: She appears well-developed. No distress.  HENT:  Nose: Nose normal.  Mouth/Throat: Oropharynx is clear and moist.  TMs visualized without erythema.  No sinus tenderness to palpation.    Neck: Neck supple.  Cardiovascular: Normal rate and regular rhythm.   Pulmonary/Chest: Breath sounds normal. No respiratory distress. She has no wheezes.  Abdominal: Soft. Bowel sounds are normal. There is no tenderness.  Minimal right side pain with lying flat and sitting up right.   Genitourinary:  Rectal exam - heme negative.   Musculoskeletal: She exhibits no edema or tenderness.  Lymphadenopathy:    She has no cervical adenopathy.  Skin: No rash noted. No erythema.  Psychiatric: She has a normal mood and affect. Her behavior is normal.    BP 130/80 mmHg  Pulse 69  Temp(Src) 97.6 F (36.4 C) (Oral)  Resp 17  Ht _0  (1.575 m)  Wt 129 lb 6 oz (58.684 kg)  BMI 23.66 kg/m2  SpO2 93% Wt Readings from Last 3 Encounters:  10/02/15 129 lb 6 oz (58.684 kg)  09/15/15 130 lb (58.968 kg)  09/01/15 132 lb 11.5 oz (60.2 kg)     Lab Results  Component Value Date   WBC 7.0 10/02/2015   HGB 12.7 10/02/2015   HCT 36.3 10/02/2015   PLT 154.0 10/02/2015   GLUCOSE 93 10/02/2015   CHOL 185 08/15/2014   TRIG 114.0 08/15/2014   HDL 70.70 08/15/2014   LDLCALC 92 08/15/2014   ALT 15 10/02/2015   AST 24 10/02/2015   NA 140 10/02/2015   K 4.3 10/02/2015   CL 105 10/02/2015   CREATININE 0.73 10/02/2015   BUN 17 10/02/2015   CO2 28 10/02/2015   TSH 1.12 08/03/2015    Nm Pet Image Initial (pi) Skull Base To Thigh  09/07/2015  CLINICAL DATA:  Initial treatment strategy for lung nodules. No primary malignancy history submitted. EXAM: NUCLEAR MEDICINE PET SKULL BASE TO THIGH TECHNIQUE: 13.1 mCi F-18 FDG was injected intravenously. Full-ring PET imaging was performed from the skull base to  thigh after the radiotracer. CT data was obtained and used for attenuation correction and anatomic localization. FASTING BLOOD GLUCOSE:  Value: 83 mg/dl COMPARISON:  Chest abdomen CTs of 08/21/2015. FINDINGS: NECK No areas of abnormal hypermetabolism. CHEST Left upper lobe pulmonary nodule measures 10 mm and a S.U.V. max of 3.3 on image 63/series 3. An inferior right upper lobe pulmonary nodule measures 10 mm and a S.U.V. max of 5.3 on image 85/series 3. A relatively subtle right middle lobe pulmonary nodule measures 7 mm and a S.U.V. max of 2.0 on image 94/series 3. Also present  on image 34/series 4 of the prior diagnostic study. No thoracic nodal hypermetabolism. Other pulmonary nodules are below PET resolution. ABDOMEN/PELVIS No areas of abnormal hypermetabolism. No hepatic hypermetabolism to correspond to the indeterminate segment 8 lesion. SKELETON No abnormal marrow activity. CT IMAGES PERFORMED FOR ATTENUATION CORRECTION No cervical adenopathy. left carotid atherosclerosis. Chest and abdomen findings deferred to recent diagnostic CTs. Mild cardiomegaly with left main coronary artery atherosclerosis. No lobar consolidation. Extensive colonic diverticulosis. Multiple hepatic cysts. Osteopenia. Thoracic hemangiomas. Thoracic compression deformities including at T9 and T12 are similar. IMPRESSION: 1. Bilateral hypermetabolic pulmonary nodules. No extrathoracic primary malignancy identified. Findings are indeterminate, but could represent synchronous primary bronchogenic carcinomas. Tissue sampling should be considered. 2. No thoracic nodal hypermetabolism identified. 3. No hypermetabolism to correspond to the indeterminate segment 8 liver lesion. Favored to be benign. Electronically Signed   By: Abigail Miyamoto M.D.   On: 09/07/2015 13:03       Assessment & Plan:   Problem List Items Addressed This Visit    Essential hypertension, benign    Blood pressure on exam is 140/68.  Will continue same medication.   Follow.       Loss of weight    Some decreased appetite recently with current infection.  Previous noted weight loss.  Down a couple of more pounds.  Refer to GI as outlined.  Follow.       Rectal bleeding - Primary    Had the bleeding as outlined.  Heme negative on exam today.  Normal bowel movement today.  No blood visualized with bowel movement.  Will check cbc and met b.  Colonoscopy 06/2012 - diverticulosis.  Will have GI evaluate given the bleeding.        Relevant Orders   Ambulatory referral to Gastroenterology   CBC with Differential/Platelet (Completed)   Hepatic function panel (Completed)   Basic metabolic panel (Completed)   URI (upper respiratory infection)    Persistent increased cough and congestion as outlined.  Treat with zpak as directed.  Robitussin DM as directed.  Saline nasal spray and nasacort nasal spray as directed.  Follow.        Relevant Medications   azithromycin (ZITHROMAX) 250 MG tablet    Other Visit Diagnoses    Screening breast examination        Relevant Orders    MM DIGITAL SCREENING BILATERAL    Breast cancer screening        Relevant Orders    MM DIGITAL SCREENING BILATERAL        Einar Pheasant, MD

## 2015-10-02 NOTE — Assessment & Plan Note (Signed)
Had the bleeding as outlined.  Heme negative on exam today.  Normal bowel movement today.  No blood visualized with bowel movement.  Will check cbc and met b.  Colonoscopy 06/2012 - diverticulosis.  Will have GI evaluate given the bleeding.

## 2015-10-02 NOTE — Progress Notes (Signed)
Pre-visit discussion using our clinic review tool. No additional management support is needed unless otherwise documented below in the visit note.  

## 2015-10-03 ENCOUNTER — Encounter: Payer: Self-pay | Admitting: Internal Medicine

## 2015-10-04 ENCOUNTER — Encounter: Payer: Self-pay | Admitting: Internal Medicine

## 2015-10-04 NOTE — Assessment & Plan Note (Signed)
Some decreased appetite recently with current infection.  Previous noted weight loss.  Down a couple of more pounds.  Refer to GI as outlined.  Follow.

## 2015-10-05 NOTE — Telephone Encounter (Signed)
Unread mychart message mailed to patient 

## 2015-10-11 DIAGNOSIS — Z86010 Personal history of colonic polyps: Secondary | ICD-10-CM | POA: Diagnosis not present

## 2015-10-11 DIAGNOSIS — Z800 Family history of malignant neoplasm of digestive organs: Secondary | ICD-10-CM | POA: Diagnosis not present

## 2015-10-11 DIAGNOSIS — K644 Residual hemorrhoidal skin tags: Secondary | ICD-10-CM | POA: Diagnosis not present

## 2015-10-11 DIAGNOSIS — K625 Hemorrhage of anus and rectum: Secondary | ICD-10-CM | POA: Diagnosis not present

## 2015-10-16 ENCOUNTER — Ambulatory Visit
Admission: RE | Admit: 2015-10-16 | Discharge: 2015-10-16 | Disposition: A | Discharge status: Home / Self Care | Payer: Medicare Other | Source: Ambulatory Visit | Attending: Internal Medicine | Admitting: Internal Medicine

## 2015-10-16 DIAGNOSIS — Z1231 Encounter for screening mammogram for malignant neoplasm of breast: Secondary | ICD-10-CM | POA: Diagnosis not present

## 2015-10-16 DIAGNOSIS — Z1239 Encounter for other screening for malignant neoplasm of breast: Secondary | ICD-10-CM

## 2015-10-20 ENCOUNTER — Telehealth: Payer: Self-pay | Admitting: *Deleted

## 2015-10-20 NOTE — Telephone Encounter (Signed)
Lung abnormalities reviewed again with pulmonary with plan to do ENB if patient is agreeable. Discussed with patient who will discuss with family and contact me for scheduling.

## 2015-10-24 DIAGNOSIS — H2513 Age-related nuclear cataract, bilateral: Secondary | ICD-10-CM | POA: Diagnosis not present

## 2015-10-30 ENCOUNTER — Encounter: Payer: Self-pay | Admitting: Internal Medicine

## 2015-10-30 NOTE — Telephone Encounter (Signed)
-----   Message from Lieutenant Diego, RN sent at 10/30/2015 11:03 AM EDT ----- Regarding: CT scan Dr. Nicki Reaper, I saw the message from Mrs. Cradle and it is fine if you would like to order the CT scan. We can also order it if you would like. As I discussed with Mrs. Wurzel and her daughter, she would have to have another CT scan prior to a navigation bronchoscopy. The one that we currently have is not current enough or with thin enough slices for navigation. If you order it, please order "super d protocol" so that we get thin slices. I would not have to be with contrast. This will also help her in making a decision about biopsy. I hope this helps. Shawn

## 2015-10-30 NOTE — Telephone Encounter (Signed)
Sent pt my chart message regarding scheduling CT through [facility].

## 2015-10-31 ENCOUNTER — Encounter: Payer: Self-pay | Admitting: Internal Medicine

## 2015-11-01 ENCOUNTER — Other Ambulatory Visit: Payer: Self-pay | Admitting: Internal Medicine

## 2015-11-01 ENCOUNTER — Encounter: Payer: Self-pay | Admitting: Internal Medicine

## 2015-11-01 ENCOUNTER — Other Ambulatory Visit (INDEPENDENT_AMBULATORY_CARE_PROVIDER_SITE_OTHER): Payer: Medicare Other

## 2015-11-01 ENCOUNTER — Telehealth: Payer: Self-pay | Admitting: *Deleted

## 2015-11-01 DIAGNOSIS — R911 Solitary pulmonary nodule: Secondary | ICD-10-CM

## 2015-11-01 DIAGNOSIS — I10 Essential (primary) hypertension: Secondary | ICD-10-CM

## 2015-11-01 LAB — COMPREHENSIVE METABOLIC PANEL
ALBUMIN: 3.7 g/dL (ref 3.5–5.2)
ALT: 9 U/L (ref 0–35)
AST: 17 U/L (ref 0–37)
Alkaline Phosphatase: 75 U/L (ref 39–117)
BUN: 24 mg/dL — AB (ref 6–23)
CHLORIDE: 106 meq/L (ref 96–112)
CO2: 28 mEq/L (ref 19–32)
CREATININE: 0.86 mg/dL (ref 0.40–1.20)
Calcium: 9.2 mg/dL (ref 8.4–10.5)
GFR: 66.18 mL/min (ref >60.00)
Glucose, Bld: 105 mg/dL — ABNORMAL HIGH (ref 70–99)
Potassium: 4.1 mEq/L (ref 3.5–5.1)
SODIUM: 140 meq/L (ref 135–145)
TOTAL PROTEIN: 6.3 g/dL (ref 6.0–8.3)
Total Bilirubin: 0.5 mg/dL (ref 0.2–1.2)

## 2015-11-01 LAB — LIPID PANEL
CHOLESTEROL: 179 mg/dL (ref 0–200)
HDL: 69.1 mg/dL (ref >39.00)
LDL CALC: 91 mg/dL (ref 0–99)
NonHDL: 110.1
Total CHOL/HDL Ratio: 3
Triglycerides: 94 mg/dL (ref 0.0–149.0)
VLDL: 18.8 mg/dL (ref 0.0–40.0)

## 2015-11-01 NOTE — Telephone Encounter (Signed)
Patient called requesting follow up imaging with Ct scan next week. Will forward to Dr. Rogue Bussing for ordering/scheduling.

## 2015-11-01 NOTE — Telephone Encounter (Signed)
Shawn, give the img number/ to order the correct test. Thx

## 2015-11-02 ENCOUNTER — Other Ambulatory Visit: Payer: Self-pay | Admitting: *Deleted

## 2015-11-02 DIAGNOSIS — R918 Other nonspecific abnormal finding of lung field: Secondary | ICD-10-CM

## 2015-11-06 ENCOUNTER — Ambulatory Visit (INDEPENDENT_AMBULATORY_CARE_PROVIDER_SITE_OTHER): Payer: Medicare Other | Admitting: Internal Medicine

## 2015-11-06 ENCOUNTER — Encounter: Payer: Self-pay | Admitting: Internal Medicine

## 2015-11-06 VITALS — BP 120/80 | HR 93 | Temp 97.9°F | Resp 17 | Ht 62.0 in | Wt 127.5 lb

## 2015-11-06 DIAGNOSIS — I10 Essential (primary) hypertension: Secondary | ICD-10-CM

## 2015-11-06 DIAGNOSIS — Z658 Other specified problems related to psychosocial circumstances: Secondary | ICD-10-CM | POA: Diagnosis not present

## 2015-11-06 DIAGNOSIS — R1011 Right upper quadrant pain: Secondary | ICD-10-CM | POA: Diagnosis not present

## 2015-11-06 DIAGNOSIS — R634 Abnormal weight loss: Secondary | ICD-10-CM

## 2015-11-06 DIAGNOSIS — F439 Reaction to severe stress, unspecified: Secondary | ICD-10-CM

## 2015-11-06 DIAGNOSIS — Z0000 Encounter for general adult medical examination without abnormal findings: Secondary | ICD-10-CM

## 2015-11-06 DIAGNOSIS — K625 Hemorrhage of anus and rectum: Secondary | ICD-10-CM | POA: Diagnosis not present

## 2015-11-06 NOTE — Assessment & Plan Note (Signed)
Saw Dr Candace Cruise.  Treated for hemorrhoids.  No further bleeding.

## 2015-11-06 NOTE — Assessment & Plan Note (Signed)
Persistent intermittent pain.  Appears to be aggravated by certain movements.  Scans as outlined.  Follow.  Recent liver panel wnl.

## 2015-11-06 NOTE — Assessment & Plan Note (Addendum)
Physical today 11/06/15.  Mammogram 10/16/15 - Birads I.  Colonoscopy 06/2012.

## 2015-11-06 NOTE — Assessment & Plan Note (Signed)
Blood pressure doing well.  Same medication regimen.  Follow pressures.  Follow metabolic panel.  Recent kidney function wnl.

## 2015-11-06 NOTE — Progress Notes (Signed)
Pre-visit discussion using our clinic review tool. No additional management support is needed unless otherwise documented below in the visit note.  

## 2015-11-06 NOTE — Progress Notes (Signed)
Patient ID: Lisa Blackwell, female   DOB: [birth year], 82 y.o.   MRN: [id number]   Subjective:    Patient ID: Lisa Blackwell, female    DOB: [birth year], 82 y.o.   MRN: [id number]  HPI  Patient here for her physical exam.  She states she is worried about her lung findings.  Undergoing w/up now.  Planning for f/u scan this week.  Seeing oncology.  She reports noticing some drainage and some hoarseness.  Flares intermittently.  No acid reflux.  No abdominal pain or cramping.  Bowels stable.  No abdominal pain or cramping. No further rectal bleeding.  Saw GI.  Treated hemorrhoids.     Past Medical History  Diagnosis Date  . History of chicken pox   . Heart murmur   . Hypertension   . Compression fracture     Spine  . Hx of colonic polyp   . Internal hemorrhoid    Past Surgical History  Procedure Laterality Date  . Appendectomy  1957  . Hemorrhoid banding  March 2015    interal rubber band ligation   Family History  Problem Relation Age of Onset  . Breast cancer Mother     16's  . Arthritis Mother   . Colon cancer Father   . Stroke Father   . Heart disease Maternal Grandmother   . Heart disease Paternal Grandmother   . Breast cancer Maternal Aunt     70's   Social History   Social History  . Marital Status: Married    Spouse Name: N/A  . Number of Children: 1  . Years of Education: N/A   Social History Main Topics  . Smoking status: Never Smoker   . Smokeless tobacco: Never Used  . Alcohol Use: No  . Drug Use: No  . Sexual Activity: Not Asked   Other Topics Concern  . None   Social History Narrative    Outpatient Encounter Prescriptions as of 11/06/2015  Medication Sig  . aspirin EC 81 MG tablet Take 81 mg by mouth daily.  . calcium citrate-vitamin D (CITRACAL+D) 315-200 MG-UNIT per tablet Take 1 tablet by mouth daily.  . Cholecalciferol (VITAMIN D) 2000 UNITS tablet Take 2,000 Units by mouth daily.  Marland Kitchen ipratropium (ATROVENT) 0.03 % nasal spray Place 2  sprays into the nose every 12 (twelve) hours.  . magnesium oxide (MAG-OX) 400 MG tablet Take 1 tablet (400 mg total) by mouth daily. (Patient taking differently: Take 400 mg by mouth daily as needed. )  . raloxifene (EVISTA) 60 MG tablet take 1 tablet by mouth daily  . telmisartan (MICARDIS) 20 MG tablet take 1 tablet by mouth once daily  . valACYclovir (VALTREX) 500 MG tablet take 1 tablet by mouth twice a day if needed  . [DISCONTINUED] Probiotic Product (ALIGN PO) Take by mouth daily.  . [DISCONTINUED] azithromycin (ZITHROMAX) 250 MG tablet Take two tablets x 1 day and then one tablet per day for four more days.   No facility-administered encounter medications on file as of 11/06/2015.    Review of Systems  Constitutional: Negative for fever.       She is eating.    HENT: Positive for postnasal drip and voice change (hoarseness). Negative for sinus pressure and sore throat.   Eyes: Negative for pain and visual disturbance.  Respiratory: Positive for cough. Negative for chest tightness and shortness of breath.   Cardiovascular: Negative for chest pain, palpitations and leg swelling.  Gastrointestinal: Negative for nausea, vomiting, abdominal  pain and diarrhea.  Genitourinary: Negative for dysuria and difficulty urinating.  Musculoskeletal: Negative for back pain and joint swelling.  Skin: Negative for color change and rash.  Neurological: Negative for dizziness and headaches.  Hematological: Negative for adenopathy. Does not bruise/bleed easily.  Psychiatric/Behavioral: Negative for dysphoric mood and agitation.       Increased stress with current testing.        Objective:    Physical Exam  Constitutional: She is oriented to person, place, and time. She appears well-developed and well-nourished. No distress.  HENT:  Nose: Nose normal.  Mouth/Throat: Oropharynx is clear and moist.  Eyes: Right eye exhibits no discharge. Left eye exhibits no discharge. No scleral icterus.  Neck:  Neck supple. No thyromegaly present.  Cardiovascular: Normal rate and regular rhythm.   Pulmonary/Chest: Breath sounds normal. No accessory muscle usage. No tachypnea. No respiratory distress. She has no decreased breath sounds. She has no wheezes. She has no rhonchi. Right breast exhibits no inverted nipple, no mass, no nipple discharge and no tenderness (no axillary adenopathy). Left breast exhibits no inverted nipple, no mass, no nipple discharge and no tenderness (no axilarry adenopathy).  Abdominal: Soft. Bowel sounds are normal. There is no tenderness.  Musculoskeletal: She exhibits no edema or tenderness.  Lymphadenopathy:    She has no cervical adenopathy.  Neurological: She is alert and oriented to person, place, and time.  Skin: Skin is warm. No rash noted. No erythema.  Psychiatric: She has a normal mood and affect. Her behavior is normal.    BP 120/80 mmHg  Pulse 93  Temp(Src) 97.9 F (36.6 C) (Oral)  Resp 17  Ht 5\' 2"  (1.575 m)  Wt 127 lb 8 oz (57.834 kg)  BMI 23.31 kg/m2  SpO2 96% Wt Readings from Last 3 Encounters:  11/06/15 127 lb 8 oz (57.834 kg)  10/02/15 129 lb 6 oz (58.684 kg)  09/15/15 130 lb (58.968 kg)     Lab Results  Component Value Date   WBC 7.0 10/02/2015   HGB 12.7 10/02/2015   HCT 36.3 10/02/2015   PLT 154.0 10/02/2015   GLUCOSE 105* 11/01/2015   CHOL 179 11/01/2015   TRIG 94.0 11/01/2015   HDL 69.10 11/01/2015   LDLCALC 91 11/01/2015   ALT 9 11/01/2015   AST 17 11/01/2015   NA 140 11/01/2015   K 4.1 11/01/2015   CL 106 11/01/2015   CREATININE 0.86 11/01/2015   BUN 24* 11/01/2015   CO2 28 11/01/2015   TSH 1.12 08/03/2015    Mm Digital Screening Bilateral  10/16/2015  CLINICAL DATA:  Screening. EXAM: DIGITAL SCREENING BILATERAL MAMMOGRAM WITH CAD COMPARISON:  Previous exam(s). ACR Breast Density Category c: The breast tissue is heterogeneously dense, which may obscure small masses. FINDINGS: There are no findings suspicious for  malignancy. Images were processed with CAD. IMPRESSION: No mammographic evidence of malignancy. A result letter of this screening mammogram will be mailed directly to the patient. RECOMMENDATION: Screening mammogram in one year. (Code:SM-B-01Y) BI-RADS CATEGORY  1: Negative. Electronically Signed   By: Lajean Manes M.D.   On: 10/16/2015 11:22       Assessment & Plan:   Problem List Items Addressed This Visit    Essential hypertension, benign    Blood pressure doing well.  Same medication regimen.  Follow pressures.  Follow metabolic panel.  Recent kidney function wnl.       Health care maintenance - Primary    Physical today 11/06/15.  Mammogram 10/16/15 - Birads  I.  Colonoscopy 06/2012.        Loss of weight    Encouraged increased po intake.  States she is eating. No nausea or vomiting.  No bowel change.  Follow.       Rectal bleeding    Saw Dr Candace Cruise.  Treated for hemorrhoids.  No further bleeding.       RUQ pain    Persistent intermittent pain.  Appears to be aggravated by certain movements.  Scans as outlined.  Follow.  Recent liver panel wnl.       Stress    Increased stress as outlined.  Discussed with her today.  Does not feel needs any further intervention.  Follow.            Einar Pheasant, MD

## 2015-11-06 NOTE — Assessment & Plan Note (Signed)
Increased stress as outlined.  Discussed with her today.  Does not feel needs any further intervention.  Follow.   

## 2015-11-06 NOTE — Assessment & Plan Note (Signed)
Encouraged increased po intake.  States she is eating. No nausea or vomiting.  No bowel change.  Follow.

## 2015-11-08 ENCOUNTER — Other Ambulatory Visit: Payer: Self-pay | Admitting: Internal Medicine

## 2015-11-09 ENCOUNTER — Ambulatory Visit
Admission: RE | Admit: 2015-11-09 | Discharge: 2015-11-09 | Disposition: A | Discharge status: Home / Self Care | Payer: Medicare Other | Source: Ambulatory Visit | Attending: Internal Medicine | Admitting: Internal Medicine

## 2015-11-09 DIAGNOSIS — R918 Other nonspecific abnormal finding of lung field: Secondary | ICD-10-CM | POA: Diagnosis not present

## 2015-11-14 ENCOUNTER — Telehealth: Payer: Self-pay | Admitting: *Deleted

## 2015-11-14 ENCOUNTER — Other Ambulatory Visit: Payer: Self-pay | Admitting: *Deleted

## 2015-11-14 ENCOUNTER — Ambulatory Visit: Payer: Medicare Other

## 2015-11-14 ENCOUNTER — Encounter: Payer: Self-pay | Admitting: Internal Medicine

## 2015-11-14 DIAGNOSIS — R918 Other nonspecific abnormal finding of lung field: Secondary | ICD-10-CM

## 2015-11-14 NOTE — Telephone Encounter (Signed)
Thin slice CT of chest obtained for purpose of attempting ENB biopsy. Results show stability over very short time period of PET positive nodules. After discussion with patient, Dr. Rogue Bussing, and Dr. Stevenson Clinch (as Dr. Mortimer Fries was not available), will have patient see Dr. Mortimer Fries prior to planning biopsy to discuss other options.

## 2015-11-14 NOTE — Telephone Encounter (Signed)
Please call pt and schedule an appt with DK. Thanks.

## 2015-11-14 NOTE — Telephone Encounter (Signed)
Appointment scheduled with Dr. Mortimer Fries for Friday 12/01/15 at 10:00. Called and spoke with patient and she is aware of appointment date, time and location. Rhonda J Cobb

## 2015-11-23 DIAGNOSIS — L578 Other skin changes due to chronic exposure to nonionizing radiation: Secondary | ICD-10-CM | POA: Diagnosis not present

## 2015-11-23 DIAGNOSIS — L570 Actinic keratosis: Secondary | ICD-10-CM | POA: Diagnosis not present

## 2015-12-01 ENCOUNTER — Encounter: Payer: Self-pay | Admitting: Internal Medicine

## 2015-12-01 ENCOUNTER — Ambulatory Visit (INDEPENDENT_AMBULATORY_CARE_PROVIDER_SITE_OTHER): Payer: Medicare Other | Admitting: Internal Medicine

## 2015-12-01 VITALS — BP 124/82 | HR 67 | Wt 128.0 lb

## 2015-12-01 DIAGNOSIS — R918 Other nonspecific abnormal finding of lung field: Secondary | ICD-10-CM | POA: Diagnosis not present

## 2015-12-01 MED ORDER — CETIRIZINE HCL 10 MG PO TABS: 10.0000 mg | ORAL_TABLET | Freq: Every day | ORAL | Status: DC

## 2015-12-01 NOTE — Progress Notes (Signed)
[facility] Consultation      Date: 12/01/2015,   MRN# [id number] Lisa Blackwell 07-19-1925 Code Status:  Code Status History    This patient does not have a recorded code status. Please follow your organizational policy for patients in this situation.     Hosp day:@LENGTHOFSTAYDAYS @ Referring MD: @ATDPROV @     PCP:      AdmissionWeight: 128 lb (58.06 kg)                 CurrentWeight: 128 lb (58.06 kg) Lisa Blackwell is a 82 y.o. old female seen in consultation for lung nodules at the request of Dr Nicki Reaper     CHIEF COMPLAINT:   Chronic cough    HISTORY OF PRESENT ILLNESS   82 yo pleasant white female seen today for abnormal CT chest with B/Ll Pulmonary nodules Her only and main complaint is chronic cough for 4 years, associated with symptoms of post nasal drip Patient has tried allergy meds and reflux medicine but still with persistent cough and clearing of throat Patient states that she has had weight loss 4 years ago approx 10 pounds, but no recent weight loss Patient is NON smoker, worked in office in computers at [employer], no environmental exposures noted, no previous pneumonias No signs of infection at this time, no lower ext swelling  Patient had CXR in Feb which led to CT  Chest and PET scan and repeat CT chest most recent on 10/2015 and PET scan revealed hypermetabolic activity There were 4 nodules-stable appearance after 3 month interval  Patient states that she has no SOB, CP, WOB I have reviewed the CT chest and I have explained risks and benefits for ENB/bronchscopy, patient would like to wait on procedure and re-assess lung nodules    PAST MEDICAL HISTORY   Past Medical History  Diagnosis Date  . History of chicken pox   . Heart murmur   . Hypertension   . Compression fracture     Spine  . Hx of colonic polyp   . Internal hemorrhoid      SURGICAL HISTORY   Past Surgical History  Procedure Laterality Date  . Appendectomy   1957  . Hemorrhoid banding  March 2015    interal rubber band ligation     FAMILY HISTORY   Family History  Problem Relation Age of Onset  . Breast cancer Mother     77's  . Arthritis Mother   . Colon cancer Father   . Stroke Father   . Heart disease Maternal Grandmother   . Heart disease Paternal Grandmother   . Breast cancer Maternal Aunt     70's     SOCIAL HISTORY   Social History  Substance Use Topics  . Smoking status: Never Smoker   . Smokeless tobacco: Never Used  . Alcohol Use: No     MEDICATIONS    Home Medication:  Current Outpatient Rx  Name  Route  Sig  Dispense  Refill  . aspirin EC 81 MG tablet   Oral   Take 81 mg by mouth daily.         . calcium citrate-vitamin D (CITRACAL+D) 315-200 MG-UNIT per tablet   Oral   Take 1 tablet by mouth daily.         Marland Kitchen ipratropium (ATROVENT) 0.03 % nasal spray   Nasal   Place 2 sprays into the nose every 12 (twelve) hours.         . magnesium  oxide (MAG-OX) 400 MG tablet   Oral   Take 1 tablet (400 mg total) by mouth daily. Patient taking differently: Take 400 mg by mouth daily as needed.    30 tablet   5   . raloxifene (EVISTA) 60 MG tablet      take 1 tablet by mouth daily   30 tablet   11   . telmisartan (MICARDIS) 20 MG tablet      take 1 tablet by mouth once daily   30 tablet   2   . valACYclovir (VALTREX) 500 MG tablet      take 1 tablet by mouth twice a day if needed   60 tablet   1     Current Medication:  Current outpatient prescriptions:  .  aspirin EC 81 MG tablet, Take 81 mg by mouth daily., Disp: , Rfl:  .  calcium citrate-vitamin D (CITRACAL+D) 315-200 MG-UNIT per tablet, Take 1 tablet by mouth daily., Disp: , Rfl:  .  ipratropium (ATROVENT) 0.03 % nasal spray, Place 2 sprays into the nose every 12 (twelve) hours., Disp: , Rfl:  .  magnesium oxide (MAG-OX) 400 MG tablet, Take 1 tablet (400 mg total) by mouth daily. (Patient taking differently: Take 400 mg by mouth  daily as needed. ), Disp: 30 tablet, Rfl: 5 .  raloxifene (EVISTA) 60 MG tablet, take 1 tablet by mouth daily, Disp: 30 tablet, Rfl: 11 .  telmisartan (MICARDIS) 20 MG tablet, take 1 tablet by mouth once daily, Disp: 30 tablet, Rfl: 2 .  valACYclovir (VALTREX) 500 MG tablet, take 1 tablet by mouth twice a day if needed, Disp: 60 tablet, Rfl: 1    ALLERGIES   Review of patient's allergies indicates no known allergies.     REVIEW OF SYSTEMS   Review of Systems  Constitutional: Negative for fever, chills, weight loss, malaise/fatigue and diaphoresis.  HENT: Negative for congestion and hearing loss.   Eyes: Negative for blurred vision.  Respiratory: Positive for cough. Negative for hemoptysis, sputum production, shortness of breath and wheezing.   Cardiovascular: Negative for chest pain, palpitations and orthopnea.  Gastrointestinal: Negative for heartburn, nausea and vomiting.  Genitourinary: Negative for dysuria and urgency.  Musculoskeletal: Negative for back pain.  Skin: Negative for rash.  Neurological: Negative for dizziness, weakness and headaches.  Endo/Heme/Allergies: Does not bruise/bleed easily.  Psychiatric/Behavioral: Negative for depression. The patient is not nervous/anxious.   All other systems reviewed and are negative.    VS: BP 124/82 mmHg  Pulse 67  Wt 128 lb (58.06 kg)  SpO2 97%     PHYSICAL EXAM  Physical Exam  Constitutional: She is oriented to person, place, and time. She appears well-developed and well-nourished. No distress.  HENT:  Head: Normocephalic and atraumatic.  Mouth/Throat: No oropharyngeal exudate.  Eyes: EOM are normal. Pupils are equal, round, and reactive to light. No scleral icterus.  Neck: Normal range of motion. Neck supple.  Cardiovascular: Normal rate, regular rhythm and normal heart sounds.   No murmur heard. Pulmonary/Chest: No stridor. No respiratory distress. She has no wheezes. She has no rales.  Abdominal: Soft. Bowel  sounds are normal.  Musculoskeletal: Normal range of motion. She exhibits no edema.  Neurological: She is alert and oriented to person, place, and time. No cranial nerve deficit.  Skin: Skin is warm. She is not diaphoretic.  Psychiatric: She has a normal mood and affect.             IMAGING    Ct  Super D Chest Wo Contrast  11/09/2015  CLINICAL DATA:  Followup indeterminate bilateral pulmonary nodules. EXAM: CT CHEST WITHOUT CONTRAST TECHNIQUE: Multidetector CT imaging of the chest was performed using thin slice collimation for electromagnetic bronchoscopy planning purposes, without intravenous contrast. COMPARISON:  PET-CT on 09/07/2015 FINDINGS: Mediastinum/Lymph Nodes: No pathologically enlarged lymph nodes or other masses identified. Heart size is normal. Mild coronary calcification noted. Lungs/Pleura: Pulmonary nodule in the medial left upper lobe measures 10 mm on image 14/series 3 and is stable since previous study. 10 mm pulmonary nodule in the inferior aspect of the right upper lobe on image 29 of series 3 also stable. 7 mm pulmonary nodule in the right middle lobe on image 35/series 3 remains stable. These nodules all showed hypermetabolic activity on recent PET. A 7 mm left lower lobe pulmonary nodule on image 42/series 3 ulcer remains stable. This did not show definite metabolic activity on recent PET, but is below PET size threshold for characterization. Other small scattered less than 5 mm pulmonary nodules are again seen bilaterally in remain stable. Upper abdomen: Normal adrenal glands. Small left hepatic lobe low-attenuation lesions remain stable most likely represent benign cysts. Musculoskeletal: No suspicious lytic or sclerotic bone lesions identified. Benign appearing old compression fractures again seen involving the T9 and T12 vertebral bodies. IMPRESSION: Stable bilateral upper lobe and right middle lobe pulmonary nodules, which were shown to be hypermetabolic on recent PET.  Malignancy cannot be excluded. Other smaller bilateral pulmonary nodules which do not show hypermetabolic activity are also stable. No evidence of thoracic lymphadenopathy or pleural effusion. Electronically Signed   By: Earle Gell M.D.   On: 11/09/2015 16:34   Images reviewed 12/01/2015    ASSESSMENT/PLAN   82 yo very pleasant white female seen today for abnormal CT chest with b/l Pulomonary Lung nodules  As listed below, The RUL, RML adn LUL nodules are hypermetabolic on PET scan  Nodule#1  LUL 10 mm stable after 3 months Nodule#2  LLL 7 mm stable after 3 months Nodule#3  RUL 10 mm stable after 3 months Nodule#4  RML 7 mm stable after 3 months  Patient does NOT want to proceed with Procedure/ENB at this time and would like to re-assess these nodules after 3 months  Will also precribe Zyrtec for allergic rhinitis 10 mg daily  Follow up in 3 months with CT chest  I have personally obtained a history, examined the patient, evaluated laboratory and independently reviewed imaging results, formulated the assessment and plan and placed orders.  The Patient requires high complexity decision making for assessment and support, frequent evaluation and titration of therapies, application of advanced monitoring technologies and extensive interpretation of multiple databases.   Patient satisfied with Plan of action and management. All questions answered  Corrin Parker, M.D.  [facility]  Medical Director [place] Director [facility] Department

## 2015-12-01 NOTE — Patient Instructions (Addendum)
Pulmonary Nodule A pulmonary nodule is a small, round growth of tissue in the lung. Pulmonary nodules can range in size from less than 1/5 inch (4 mm) to a little bigger than an inch (25 mm). Most pulmonary nodules are detected when imaging tests of the lung are being performed for a different problem. Pulmonary nodules are usually not cancerous (benign). However, some pulmonary nodules are cancerous (malignant). Follow-up treatment or testing is based on the size of the pulmonary nodule and your risk of getting lung cancer.  CAUSES Benign pulmonary nodules can be caused by various things. Some of the causes include:   Bacterial, fungal, or viral infections. This is usually an old infection that is no longer active, but it can sometimes be a current, active infection.  A benign mass of tissue.  Inflammation from conditions such as rheumatoid arthritis.   Abnormal blood vessels in the lungs. Malignant pulmonary nodules can result from lung cancer or from cancers that spread to the lung from other places in the body. SIGNS AND SYMPTOMS Pulmonary nodules usually do not cause symptoms. DIAGNOSIS Most often, pulmonary nodules are found incidentally when an X-ray or CT scan is performed to look for some other problem in the lung area. To help determine whether a pulmonary nodule is benign or malignant, your health care provider will take a medical history and order a variety of tests. Tests done may include:   Blood tests.  A skin test called a tuberculin test. This test is used to determine if you have been exposed to the germ that causes tuberculosis.   Chest X-rays. If possible, a new X-ray may be compared with X-rays you have had in the past.   CT scan. This test shows smaller pulmonary nodules more clearly than an X-ray.   Positron emission tomography (PET) scan. In this test, a safe amount of a radioactive substance is injected into the bloodstream. Then, the scan takes a picture of  the pulmonary nodule. The radioactive substance is eliminated from your body in your urine.   Biopsy. A tiny piece of the pulmonary nodule is removed so it can be checked under a microscope. TREATMENT  Pulmonary nodules that are benign normally do not require any treatment because they usually do not cause symptoms or breathing problems. Your health care provider may want to monitor the pulmonary nodule through follow-up CT scans. The frequency of these CT scans will vary based on the size of the nodule and the risk factors for lung cancer. For example, CT scans will need to be done more frequently if the pulmonary nodule is larger and if you have a history of smoking and a family history of cancer. Further testing or biopsies may be done if any follow-up CT scan shows that the size of the pulmonary nodule has increased. HOME CARE INSTRUCTIONS  Only take over-the-counter or prescription medicines as directed by your health care provider.  Keep all follow-up appointments with your health care provider. SEEK MEDICAL CARE IF:  You have trouble breathing when you are active.   You feel sick or unusually tired.   You do not feel like eating.   You lose weight without trying to.   You develop chills or night sweats.  SEEK IMMEDIATE MEDICAL CARE IF:  You cannot catch your breath, or you begin wheezing.   You cannot stop coughing.   You cough up blood.   You become dizzy or feel like you are going to pass out.   You   have sudden chest pain.   You have a fever or persistent symptoms for more than 2-3 days.   You have a fever and your symptoms suddenly get worse. MAKE SURE YOU:  Understand these instructions.  Will watch your condition.  Will get help right away if you are not doing well or get worse.   This information is not intended to replace advice given to you by your health care provider. Make sure you discuss any questions you have with your health care  provider.   Document Released: 04/07/2009 Document Revised: 02/10/2013 Document Reviewed: 11/30/2012 Elsevier Interactive Patient Education [employer].  Follow up in 3 months with CT chest

## 2015-12-05 ENCOUNTER — Telehealth: Payer: Self-pay | Admitting: Internal Medicine

## 2015-12-05 NOTE — Telephone Encounter (Signed)
Spoke with daughter and read what DK wrote in his OV note. States she lives out of town and she will be in town in 2 weeks. Informed pt's daughter to call back with any questions. Nothing further needed.

## 2015-12-05 NOTE — Telephone Encounter (Signed)
Pt daughter is calling to discuss pt visit on 6/9. Please call.

## 2015-12-11 DIAGNOSIS — I351 Nonrheumatic aortic (valve) insufficiency: Secondary | ICD-10-CM | POA: Diagnosis not present

## 2015-12-11 DIAGNOSIS — I10 Essential (primary) hypertension: Secondary | ICD-10-CM | POA: Diagnosis not present

## 2015-12-11 DIAGNOSIS — I428 Other cardiomyopathies: Secondary | ICD-10-CM | POA: Diagnosis not present

## 2015-12-18 ENCOUNTER — Inpatient Hospital Stay: Payer: Medicare Other | Attending: Internal Medicine | Admitting: Internal Medicine

## 2015-12-18 VITALS — BP 152/84 | HR 65 | Temp 97.3°F | Resp 18 | Wt 129.9 lb

## 2015-12-18 DIAGNOSIS — I10 Essential (primary) hypertension: Secondary | ICD-10-CM | POA: Insufficient documentation

## 2015-12-18 DIAGNOSIS — Z79899 Other long term (current) drug therapy: Secondary | ICD-10-CM | POA: Insufficient documentation

## 2015-12-18 DIAGNOSIS — R918 Other nonspecific abnormal finding of lung field: Secondary | ICD-10-CM | POA: Diagnosis not present

## 2015-12-18 DIAGNOSIS — Z7982 Long term (current) use of aspirin: Secondary | ICD-10-CM | POA: Diagnosis not present

## 2015-12-18 DIAGNOSIS — R05 Cough: Secondary | ICD-10-CM | POA: Diagnosis not present

## 2015-12-18 NOTE — Progress Notes (Signed)
[facility] CONSULT NOTE  Patient Care Team: Einar Pheasant, MD as PCP - General (Internal Medicine)  CHIEF COMPLAINTS/PURPOSE OF CONSULTATION:   # MARCH 2017- BILATERAL LUNG NODULES/ Liver cysts? [cough/abdominal bloating ]; PET avid- suspicious of malignancy; tumor marker-N.   # Colonoscopy- jan 2014/mammogram in January 2016 that was normal   HISTORY OF PRESENTING ILLNESS:  Lisa Blackwell 82 y.o.  female  Patient  Who is fairly healthy/ active for age-  with bilateral lung nodules/as discussed above.   In the interim patient has met with pulmonary Dr.Kasa.    Dry cough; she continues to have cough not any worse. She continues to deny  any unusual shortness of breath or chest pain.   She is currently taking Zyrtec. Otherwise no new symptoms.  ROS: A complete 10 point review of system is done which is negative except mentioned above in history of present illness  MEDICAL HISTORY:  Past Medical History  Diagnosis Date  . History of chicken pox   . Heart murmur   . Hypertension   . Compression fracture     Spine  . Hx of colonic polyp   . Internal hemorrhoid     SURGICAL HISTORY: Past Surgical History  Procedure Laterality Date  . Appendectomy  1957  . 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  She lives with her husband at home.  She still drives.  Social History   Social History  . Marital Status: Married    Spouse Name: N/A  . Number of Children: 1  . Years of Education: N/A   Occupational History  . Not on file.   Social History Main Topics  . Smoking status: Never Smoker   . Smokeless tobacco: Never Used  . Alcohol Use: No  . Drug Use: No  . Sexual Activity: Not on file   Other Topics Concern  . Not on file   Social History Narrative    FAMILY HISTORY: Family History  Problem Relation Age of Onset  . Breast cancer Mother     5's  . Arthritis Mother    . Colon cancer Father   . Stroke Father   . Heart disease Maternal Grandmother   . Heart disease Paternal Grandmother   . Breast cancer Maternal Aunt     70's    ALLERGIES:  has No Known Allergies.  MEDICATIONS:  Current Outpatient Prescriptions  Medication Sig Dispense Refill  . aspirin EC 81 MG tablet Take 81 mg by mouth daily.    . calcium citrate-vitamin D (CITRACAL+D) 315-200 MG-UNIT per tablet Take 1 tablet by mouth daily.    . cetirizine (ZYRTEC) 10 MG tablet Take 1 tablet (10 mg total) by mouth at bedtime. 30 tablet 3  . ipratropium (ATROVENT) 0.03 % nasal spray Place 2 sprays into the nose every 12 (twelve) hours.    . magnesium oxide (MAG-OX) 400 MG tablet Take 1 tablet (400 mg total) by mouth daily. (Patient taking differently: Take 400 mg by mouth daily as needed. ) 30 tablet 5  . raloxifene (EVISTA) 60 MG tablet take 1 tablet by mouth daily 30 tablet 11  . telmisartan (MICARDIS) 20 MG tablet take 1 tablet by mouth once daily 30 tablet 2  . valACYclovir (VALTREX) 500 MG tablet take 1 tablet by mouth twice a day if needed 60 tablet 1   No current facility-administered medications for this visit.      Marland Kitchen  PHYSICAL EXAMINATION: ECOG PERFORMANCE STATUS: 1 - Symptomatic but completely ambulatory  Filed Vitals:   12/18/15 0959  BP: 152/84  Pulse: 65  Temp: 97.3 F (36.3 C)  Resp: 18   Filed Weights   12/18/15 0959  Weight: 129 lb 13.6 oz (58.9 kg)    GENERAL: Well-nourished well-developed; Alert, no distress and comfortable.  Alone.  Looked much younger for her stated age. EYES: no pallor or icterus OROPHARYNX: no thrush or ulceration; good dentition  NECK: supple, no masses felt LYMPH:  no palpable lymphadenopathy in the cervical, axillary or inguinal regions LUNGS: clear to auscultation and  No wheeze or crackles HEART/CVS: regular rate & rhythm and no murmurs; No lower extremity edema ABDOMEN: abdomen soft, non-tender and normal bowel  sounds Musculoskeletal:no cyanosis of digits and no clubbing  PSYCH: alert & oriented x 3 with fluent speech NEURO: no focal motor/sensory deficits SKIN:  no rashes or significant lesions Right and left BREAST exam [in the presence of nurse]- no unusual skin changes or dominant masses felt.    LABORATORY DATA:  I have reviewed the data as listed Lab Results  Component Value Date   WBC 7.0 10/02/2015   HGB 12.7 10/02/2015   HCT 36.3 10/02/2015   MCV 88.7 10/02/2015   PLT 154.0 10/02/2015    Recent Labs  09/01/15 1149 10/02/15 1015 11/01/15 0823  NA 135 140 140  K 4.0 4.3 4.1  CL 102 105 106  CO2 _0 GLUCOSE 100* 93 105*  BUN 24* 17 24*  CREATININE 0.90 0.73 0.86  CALCIUM 9.1 9.2 9.2  GFRNONAA 56*  --   --   GFRAA >60  --   --   PROT 6.8 6.5 6.3  ALBUMIN 4.0 3.6 3.7  AST _1 ALT 13* 15 9  ALKPHOS 91 80 75  BILITOT 0.6 0.7 0.5  BILIDIR  --  0.2  --     RADIOGRAPHIC STUDIES: I have personally reviewed the radiological images as listed and agreed with the findings in the report. No results found.  ASSESSMENT & PLAN:   Lung nodules Bilateral lung nodules- question etiology. PET avid. Suspicion of malignancy; not proven. Evaluated by pulmonary/Dr. Mortimer Fries; who recommended biopsy. However patient after reviewing the pros and cons - Patient has decided to wait for 3 months. CT scan is ordered for the end of August/pulmonary evaluation.  Patient follow-up with me in 18th week of September to review the plan/Plan of care. No labs. She agrees.       Cammie Sickle, MD 12/18/2015 10:24 AM

## 2015-12-18 NOTE — Assessment & Plan Note (Signed)
Bilateral lung nodules- question etiology. PET avid. Suspicion of malignancy; not proven. Evaluated by pulmonary/Dr. Mortimer Fries; who recommended biopsy. However patient after reviewing the pros and cons - Patient has decided to wait for 3 months. CT scan is ordered for the end of August/pulmonary evaluation.  Patient follow-up with me in 18th week of September to review the plan/Plan of care. No labs. She agrees.

## 2015-12-18 NOTE — Progress Notes (Signed)
Patient states she coughs and clears her throat a lot.  Feels hoarse.

## 2015-12-19 ENCOUNTER — Telehealth: Payer: Self-pay | Admitting: Internal Medicine

## 2015-12-19 NOTE — Telephone Encounter (Signed)
Daughter has questions about procedure.  Please call [phone].

## 2015-12-19 NOTE — Telephone Encounter (Signed)
Answered the daughter's questions about her last OV with DK. Nothing further needed.

## 2016-02-01 DIAGNOSIS — L821 Other seborrheic keratosis: Secondary | ICD-10-CM | POA: Diagnosis not present

## 2016-02-01 DIAGNOSIS — L570 Actinic keratosis: Secondary | ICD-10-CM | POA: Diagnosis not present

## 2016-02-01 DIAGNOSIS — L578 Other skin changes due to chronic exposure to nonionizing radiation: Secondary | ICD-10-CM | POA: Diagnosis not present

## 2016-02-04 ENCOUNTER — Other Ambulatory Visit: Payer: Self-pay | Admitting: Internal Medicine

## 2016-02-15 ENCOUNTER — Ambulatory Visit (INDEPENDENT_AMBULATORY_CARE_PROVIDER_SITE_OTHER): Payer: Medicare Other | Admitting: Internal Medicine

## 2016-02-15 ENCOUNTER — Encounter: Payer: Self-pay | Admitting: Internal Medicine

## 2016-02-15 DIAGNOSIS — Z23 Encounter for immunization: Secondary | ICD-10-CM

## 2016-02-15 DIAGNOSIS — F439 Reaction to severe stress, unspecified: Secondary | ICD-10-CM

## 2016-02-15 DIAGNOSIS — R918 Other nonspecific abnormal finding of lung field: Secondary | ICD-10-CM | POA: Diagnosis not present

## 2016-02-15 DIAGNOSIS — Z658 Other specified problems related to psychosocial circumstances: Secondary | ICD-10-CM | POA: Diagnosis not present

## 2016-02-15 DIAGNOSIS — I10 Essential (primary) hypertension: Secondary | ICD-10-CM

## 2016-02-15 DIAGNOSIS — R634 Abnormal weight loss: Secondary | ICD-10-CM

## 2016-02-15 NOTE — Progress Notes (Signed)
Pre-visit discussion using our clinic review tool. No additional management support is needed unless otherwise documented below in the visit note.  

## 2016-02-15 NOTE — Progress Notes (Signed)
Patient ID: [name], female   DOB: 02-Dec-1925, 82 y.o.   MRN: [id number]   Subjective:    Patient ID: Lisa [facility]well, female    DOB: 1925/09/19, 82 y.o.   MRN: [id number]  HPI  Patient here for a scheduled follow up.  She is currently undergoing w/up and being followed for lung nodules.  Seeing oncology and pulmonary.  Notes reviewed.  Scan results reviewed with her.  Overall she feels her breathing is stable.  Has a f/u CT scheduled for next week.  After scan, then due f/u with Dr Mortimer Fries and Dr Rogue Bussing.   She is eating.  No nausea or vomiting.  No abdominal pain.  Bowels stable.  Increased stress related to her current w/up and her husband's issues.  She feels she is handling things relatively well.     Past Medical History:  Diagnosis Date  . Compression fracture    Spine  . Heart murmur   . History of chicken pox   . Hx of colonic polyp   . Hypertension   . Internal hemorrhoid    Past Surgical History:  Procedure Laterality Date  . APPENDECTOMY  1957  . HEMORRHOID BANDING  March 2015   interal rubber band ligation   Family History  Problem Relation Age of Onset  . Breast cancer Mother     72's  . Arthritis Mother   . Colon cancer Father   . Stroke Father   . Heart disease Maternal Grandmother   . Heart disease Paternal Grandmother   . Breast cancer Maternal Aunt     70's   Social History   Social History  . Marital status: Married    Spouse name: N/A  . Number of children: 1  . Years of education: N/A   Social History Main Topics  . Smoking status: Never Smoker  . Smokeless tobacco: Never Used  . Alcohol use No  . Drug use: No  . Sexual activity: Not Asked   Other Topics Concern  . None   Social History Narrative  . None    Outpatient Encounter Prescriptions as of 02/15/2016  Medication Sig  . aspirin EC 81 MG tablet Take 81 mg by mouth daily.  . calcium citrate-vitamin D (CITRACAL+D) 315-200 MG-UNIT per tablet Take 1 tablet by mouth daily.   . cetirizine (ZYRTEC) 10 MG tablet Take 1 tablet (10 mg total) by mouth at bedtime.  . magnesium oxide (MAG-OX) 400 MG tablet Take 1 tablet (400 mg total) by mouth daily. (Patient taking differently: Take 400 mg by mouth daily as needed. )  . raloxifene (EVISTA) 60 MG tablet take 1 tablet by mouth daily  . telmisartan (MICARDIS) 20 MG tablet take 1 tablet by mouth once daily  . valACYclovir (VALTREX) 500 MG tablet take 1 tablet by mouth twice a day if needed  . [DISCONTINUED] ipratropium (ATROVENT) 0.03 % nasal spray Place 2 sprays into the nose every 12 (twelve) hours.  . [DISCONTINUED] telmisartan (MICARDIS) 20 MG tablet take 1 tablet by mouth once daily   No facility-administered encounter medications on file as of 02/15/2016.     Review of Systems  Constitutional: Negative for appetite change and fever.  HENT: Negative for congestion and sinus pressure.   Respiratory: Negative for cough, chest tightness and shortness of breath.        Breathing stable.    Cardiovascular: Negative for chest pain, palpitations and leg swelling.  Gastrointestinal: Negative for abdominal pain, diarrhea, nausea and vomiting.  Genitourinary: Negative for difficulty urinating and dysuria.  Musculoskeletal: Negative for joint swelling and myalgias.  Skin: Negative for color change and rash.  Neurological: Negative for dizziness, light-headedness and headaches.  Psychiatric/Behavioral: Negative for agitation and dysphoric mood.       Objective:    Physical Exam  Constitutional: She appears well-developed and well-nourished. No distress.  HENT:  Nose: Nose normal.  Mouth/Throat: Oropharynx is clear and moist.  Neck: Neck supple. No thyromegaly present.  Cardiovascular: Normal rate and regular rhythm.   Pulmonary/Chest: Breath sounds normal. No respiratory distress. She has no wheezes.  Abdominal: Soft. Bowel sounds are normal. There is no tenderness.  Musculoskeletal: She exhibits no edema or  tenderness.  Lymphadenopathy:    She has no cervical adenopathy.  Skin: No rash noted. No erythema.  Psychiatric: She has a normal mood and affect.    BP 128/80   Pulse 87   Temp 98.5 F (36.9 C) (Oral)   Resp 18   Ht 5\' 2"  (1.575 m)   Wt 130 lb 6 oz (59.1 kg)   SpO2 95%   BMI 23.85 kg/m  Wt Readings from Last 3 Encounters:  02/15/16 130 lb 6 oz (59.1 kg)  12/18/15 129 lb 13.6 oz (58.9 kg)  12/01/15 128 lb (58.1 kg)     Lab Results  Component Value Date   WBC 7.0 10/02/2015   HGB 12.7 10/02/2015   HCT 36.3 10/02/2015   PLT 154.0 10/02/2015   GLUCOSE 105 (H) 11/01/2015   CHOL 179 11/01/2015   TRIG 94.0 11/01/2015   HDL 69.10 11/01/2015   LDLCALC 91 11/01/2015   ALT 9 11/01/2015   AST 17 11/01/2015   NA 140 11/01/2015   K 4.1 11/01/2015   CL 106 11/01/2015   CREATININE 0.86 11/01/2015   BUN 24 (H) 11/01/2015   CO2 28 11/01/2015   TSH 1.12 08/03/2015    Ct Super D Chest Wo Contrast  Result Date: 11/09/2015 CLINICAL DATA:  Followup indeterminate bilateral pulmonary nodules. EXAM: CT CHEST WITHOUT CONTRAST TECHNIQUE: Multidetector CT imaging of the chest was performed using thin slice collimation for electromagnetic bronchoscopy planning purposes, without intravenous contrast. COMPARISON:  PET-CT on 09/07/2015 FINDINGS: Mediastinum/Lymph Nodes: No pathologically enlarged lymph nodes or other masses identified. Heart size is normal. Mild coronary calcification noted. Lungs/Pleura: Pulmonary nodule in the medial left upper lobe measures 10 mm on image 14/series 3 and is stable since previous study. 10 mm pulmonary nodule in the inferior aspect of the right upper lobe on image 29 of series 3 also stable. 7 mm pulmonary nodule in the right middle lobe on image 35/series 3 remains stable. These nodules all showed hypermetabolic activity on recent PET. A 7 mm left lower lobe pulmonary nodule on image 42/series 3 ulcer remains stable. This did not show definite metabolic activity  on recent PET, but is below PET size threshold for characterization. Other small scattered less than 5 mm pulmonary nodules are again seen bilaterally in remain stable. Upper abdomen: Normal adrenal glands. Small left hepatic lobe low-attenuation lesions remain stable most likely represent benign cysts. Musculoskeletal: No suspicious lytic or sclerotic bone lesions identified. Benign appearing old compression fractures again seen involving the T9 and T12 vertebral bodies. IMPRESSION: Stable bilateral upper lobe and right middle lobe pulmonary nodules, which were shown to be hypermetabolic on recent PET. Malignancy cannot be excluded. Other smaller bilateral pulmonary nodules which do not show hypermetabolic activity are also stable. No evidence of thoracic lymphadenopathy or pleural effusion. Electronically  Signed   By: Earle Gell M.D.   On: 11/09/2015 16:34       Assessment & Plan:   Problem List Items Addressed This Visit    Essential hypertension, benign    Blood pressure under good control.  Continue same medication regimen.  Follow pressures.  Follow metabolic panel.        Loss of weight    Eating.  Weight stable.  Follow.       Lung nodules    Undergoing w/u for bilateral lung nodules.  Suspicion of malignancy.  Not proven.  Being followed by Dr Mortimer Fries and Dr Rogue Bussing.  Planning for f/u CT scan next week.  Breathing stable.  Considering biopsy.       Stress    Increased stress as outlined.  Discussed with her today.  She feels she is coping relatively well and desires no further intervention.  Follow.        Other Visit Diagnoses    Encounter for immunization       Relevant Orders   Flu Vaccine QUAD 36+ mos IM (Completed)       Einar Pheasant, MD

## 2016-02-16 ENCOUNTER — Encounter: Payer: Self-pay | Admitting: Internal Medicine

## 2016-02-16 NOTE — Assessment & Plan Note (Signed)
Undergoing w/u for bilateral lung nodules.  Suspicion of malignancy.  Not proven.  Being followed by Dr Mortimer Fries and Dr Rogue Bussing.  Planning for f/u CT scan next week.  Breathing stable.  Considering biopsy.

## 2016-02-16 NOTE — Assessment & Plan Note (Signed)
Eating.  Weight stable.  Follow.

## 2016-02-16 NOTE — Assessment & Plan Note (Signed)
Increased stress as outlined.  Discussed with her today.  She feels she is coping relatively well and desires no further intervention.  Follow.

## 2016-02-16 NOTE — Assessment & Plan Note (Signed)
Blood pressure under good control.  Continue same medication regimen.  Follow pressures.  Follow metabolic panel.   

## 2016-02-22 ENCOUNTER — Ambulatory Visit
Admission: RE | Admit: 2016-02-22 | Discharge: 2016-02-22 | Disposition: A | Discharge status: Home / Self Care | Payer: Medicare Other | Source: Ambulatory Visit | Attending: Internal Medicine | Admitting: Internal Medicine

## 2016-02-22 DIAGNOSIS — I700 Atherosclerosis of aorta: Secondary | ICD-10-CM | POA: Diagnosis not present

## 2016-02-22 DIAGNOSIS — M4854XA Collapsed vertebra, not elsewhere classified, thoracic region, initial encounter for fracture: Secondary | ICD-10-CM | POA: Insufficient documentation

## 2016-02-22 DIAGNOSIS — I2510 Atherosclerotic heart disease of native coronary artery without angina pectoris: Secondary | ICD-10-CM | POA: Diagnosis not present

## 2016-02-22 DIAGNOSIS — R918 Other nonspecific abnormal finding of lung field: Secondary | ICD-10-CM

## 2016-02-22 DIAGNOSIS — K769 Liver disease, unspecified: Secondary | ICD-10-CM | POA: Diagnosis not present

## 2016-02-27 ENCOUNTER — Ambulatory Visit (INDEPENDENT_AMBULATORY_CARE_PROVIDER_SITE_OTHER): Payer: Medicare Other | Admitting: Internal Medicine

## 2016-02-27 ENCOUNTER — Encounter: Payer: Self-pay | Admitting: Internal Medicine

## 2016-02-27 VITALS — BP 122/70 | HR 71 | Ht 62.0 in | Wt 131.6 lb

## 2016-02-27 DIAGNOSIS — R918 Other nonspecific abnormal finding of lung field: Secondary | ICD-10-CM

## 2016-02-27 NOTE — Progress Notes (Signed)
Lisa Blackwell      Date: 02/27/2016,   MRN# [id number] Lisa Blackwell [birth year] Code Status:  Code Status History    This patient does not have a recorded code status. Please follow your organizational policy for patients in this situation.     Hosp day:@LENGTHOFSTAYDAYS @ Referring MD: @ATDPROV @     PCP:      AdmissionWeight: 131 lb 9.6 oz (59.7 kg)                 CurrentWeight: 131 lb 9.6 oz (59.7 kg) Lisa Blackwell is a 82 y.o. old female seen in [place] for lung nodules at the request of Dr Nicki Reaper     CHIEF COMPLAINT:   Follow up pulmonary nodules    HISTORY OF PRESENT ILLNESS   82 yo pleasant white female seen today for follow up for abnormal CT chest with B/L Pulmonary nodules  No signs of infection at this time, no lower ext swelling No acute resp issues  Patient had CXR in Feb which led to CT  Chest and PET scan and repeat CT chest most recent on 10/2015 and PET scan revealed hypermetabolic activity There were 4 nodules-stable appearance after 3 month interval back on May  Currently, CT chest shows stability of Pulmonary nodules over the past 8 months   Current Medication:  Current Outpatient Prescriptions:  .  aspirin EC 81 MG tablet, Take 81 mg by mouth daily., Disp: , Rfl:  .  calcium citrate-vitamin D (CITRACAL+D) 315-200 MG-UNIT per tablet, Take 1 tablet by mouth daily., Disp: , Rfl:  .  cetirizine (ZYRTEC) 10 MG tablet, Take 1 tablet (10 mg total) by mouth at bedtime., Disp: 30 tablet, Rfl: 3 .  magnesium oxide (MAG-OX) 400 MG tablet, Take 1 tablet (400 mg total) by mouth daily. (Patient taking differently: Take 400 mg by mouth daily as needed. ), Disp: 30 tablet, Rfl: 5 .  raloxifene (EVISTA) 60 MG tablet, take 1 tablet by mouth daily, Disp: 30 tablet, Rfl: 11 .  telmisartan (MICARDIS) 20 MG tablet, take 1 tablet by mouth once daily, Disp: 30 tablet, Rfl: 2 .  valACYclovir (VALTREX) 500 MG tablet, take 1 tablet  by mouth twice a day if needed, Disp: 60 tablet, Rfl: 1    ALLERGIES   Review of patient's allergies indicates no known allergies.     REVIEW OF SYSTEMS   Review of Systems  Constitutional: Negative for chills, diaphoresis, fever, malaise/fatigue and weight loss.  HENT: Negative for congestion and hearing loss.   Respiratory: Negative for cough, hemoptysis, sputum production, shortness of breath and wheezing.   Cardiovascular: Negative for chest pain, palpitations, orthopnea and leg swelling.  Gastrointestinal: Negative for heartburn, nausea and vomiting.  Skin: Negative for rash.  Neurological: Negative for weakness and headaches.  All other systems reviewed and are negative.    VS: BP 122/70 (BP Location: Left Arm, Cuff Size: Normal)   Pulse 71   Ht 5\' 2"  (1.575 m)   Wt 131 lb 9.6 oz (59.7 kg)   SpO2 95%   BMI 24.07 kg/m      PHYSICAL EXAM  Physical Exam  Constitutional: She is oriented to person, place, and time. No distress.  HENT:  Mouth/Throat: No oropharyngeal exudate.  Eyes: No scleral icterus.  Cardiovascular: Normal rate, regular rhythm and normal heart sounds.   No murmur heard. Pulmonary/Chest: Effort normal and breath sounds normal. No stridor. No respiratory distress. She has no wheezes. She has no rales.  Musculoskeletal: Normal range of motion. She exhibits no edema.  Neurological: She is alert and oriented to person, place, and time. No cranial nerve deficit.  Skin: Skin is warm. She is not diaphoretic.  Psychiatric: She has a normal mood and affect.             IMAGING    Ct Chest Wo Contrast  Result Date: 02/22/2016 CLINICAL DATA:  Cough and hoarseness over the last 3 weeks. Follow up of previously hypermetabolic pulmonary nodules EXAM: CT CHEST WITHOUT CONTRAST TECHNIQUE: Multidetector CT imaging of the chest was performed following the standard protocol without IV contrast. COMPARISON:  Multiple exams, including 11/09/2015 FINDINGS:  Cardiovascular: Coronary, aortic arch, and branch vessel atherosclerotic vascular disease. Borderline cardiomegaly. Ascending aortic ectasia, 3.8 cm diameter. Mediastinum/Nodes: No adenopathy identified. Lungs/Pleura: Multiple pulmonary nodules are observed, and by my measurements are stable in size compared to 08/21/15, the earliest available comparison images. Some of these were hypermetabolic, including the lower anterior right upper lobe nodule which measures 1.1 by 0.9 cm on image 68/3 of today's exam, unchanged from 08/21/2015. Accounting for slice selection, the other nodules are also stable. No new nodules are identified. Upper Abdomen: Hypodense lesions in the liver do not appear changed in size compared to 08/21/15. These were not previously appreciably hypermetabolic. Musculoskeletal: Vertebra plana at T9 and prominent compression fracture at T12, similar to 08/21/15. Lower thoracic kyphosis. IMPRESSION: 1. The various pulmonary nodules, some of which were previously hypermetabolic, are unchanged compared to the earliest available comparison exam of 08/21/2015, indicating 6 months of size stability. Low-grade malignancy, for example from metastatic disease, is still not excluded, correlate with any prior tissue diagnosis/biopsy. If not previously performed, percutaneous biopsy should be strongly considered. 2. Coronary, aortic arch, and branch vessel atherosclerotic vascular disease. 3. Hypodense lesions in the liver are not changed from 08/21/2015 and were not previously hypermetabolic. 4. Stable appearance of the compression fractures at T9 and T12. Electronically Signed   By: Van Clines M.D.   On: 02/22/2016 16:23   Images reviewed 02/27/2016 Stable pulmonary nodules   ASSESSMENT/PLAN   82 yo very pleasant white female seen for follow up for abnormal CT chest with b/l Pulomonary Lung nodules  As listed below, The RUL, RML adn LUL nodules are hypermetabolic on PET scan  Nodule#1  LUL 10 mm  stable after 8 months Nodule#2  LLL 7 mm stable after 8 months Nodule#3  RUL 10 mm stable after 8 months Nodule#4  RML 7 mm stable after 8 months  Patient does NOT want to proceed with Procedure/ENB at this time and would like to re-assess these nodules after 3-4 months  Follow up in 4 months with CT chest    The Patient requires high complexity decision making for assessment and support, frequent evaluation and titration of therapies, application of advanced monitoring technologies and extensive interpretation of multiple databases.   Patient satisfied with Plan of action and management. All questions answered  Corrin Parker, M.D.  [facility]  Medical Director [place] Director [facility] Cardio-Pulmonary Department

## 2016-02-27 NOTE — Patient Instructions (Signed)
Follow up in 4 months with Ct chest

## 2016-03-06 ENCOUNTER — Other Ambulatory Visit: Payer: Self-pay | Admitting: Internal Medicine

## 2016-03-07 DIAGNOSIS — L578 Other skin changes due to chronic exposure to nonionizing radiation: Secondary | ICD-10-CM | POA: Diagnosis not present

## 2016-03-07 DIAGNOSIS — L570 Actinic keratosis: Secondary | ICD-10-CM | POA: Diagnosis not present

## 2016-03-14 ENCOUNTER — Inpatient Hospital Stay: Payer: Medicare Other | Attending: Internal Medicine | Admitting: Internal Medicine

## 2016-03-14 ENCOUNTER — Encounter: Payer: Self-pay | Admitting: Internal Medicine

## 2016-03-14 VITALS — BP 159/86 | HR 73 | Temp 97.7°F | Ht 62.0 in | Wt 131.0 lb

## 2016-03-14 DIAGNOSIS — Z79899 Other long term (current) drug therapy: Secondary | ICD-10-CM | POA: Diagnosis not present

## 2016-03-14 DIAGNOSIS — Z7982 Long term (current) use of aspirin: Secondary | ICD-10-CM | POA: Insufficient documentation

## 2016-03-14 DIAGNOSIS — R05 Cough: Secondary | ICD-10-CM | POA: Diagnosis not present

## 2016-03-14 DIAGNOSIS — K7689 Other specified diseases of liver: Secondary | ICD-10-CM | POA: Diagnosis not present

## 2016-03-14 DIAGNOSIS — R918 Other nonspecific abnormal finding of lung field: Secondary | ICD-10-CM

## 2016-03-14 DIAGNOSIS — I10 Essential (primary) hypertension: Secondary | ICD-10-CM | POA: Insufficient documentation

## 2016-03-14 NOTE — Progress Notes (Signed)
[facility] CONSULT NOTE  Patient Care Team: Einar Pheasant, MD as PCP - General (Internal Medicine)  CHIEF COMPLAINTS/PURPOSE OF CONSULTATION:   # MARCH 2017- BILATERAL LUNG NODULES/ Liver cysts? [cough/abdominal bloating ]; PET avid- suspicious of malignancy; tumor marker-N. AUG 31st CT scan-STABLE [Dr.Kasa]  # Colonoscopy- jan 2014/mammogram in January 2016 that was normal   HISTORY OF PRESENTING ILLNESS:  Lisa [place]well 82 y.o.  female  Patient  Who is fairly healthy/ active for age-  with bilateral lung nodules/as discussed above.   In the interim patient has met with pulmonary Dr.Kasa/ reviewed the CT scan August 2017.    Dry cough; she continues to have cough not any worse. She continues to deny  any unusual shortness of breath or chest pain.   She is currently taking antihistamine Otherwise no new symptoms.  ROS: A complete 10 point review of system is done which is negative except mentioned above in history of present illness  MEDICAL HISTORY:  Past Medical History:  Diagnosis Date  . Compression fracture    Spine  . Heart murmur   . History of chicken pox   . Hx of colonic polyp   . Hypertension   . Internal hemorrhoid     SURGICAL HISTORY: Past Surgical History:  Procedure Laterality Date  . APPENDECTOMY  1957  . HEMORRHOID BANDING  March 2015   interal rubber band ligation    SOCIAL HISTORY: retd from [employer].  She lives with her husband at home.  She still drives.  Social History   Social History  . Marital status: Married    Spouse name: N/A  . Number of children: 1  . Years of education: N/A   Occupational History  . Not on file.   Social History Main Topics  . Smoking status: Never Smoker  . Smokeless tobacco: Never Used  . Alcohol use No  . Drug use: No  . Sexual activity: Not on file   Other Topics Concern  . Not on file   Social History Narrative  . No narrative on file    FAMILY HISTORY: Family  History  Problem Relation Age of Onset  . Breast cancer Mother     68's  . Arthritis Mother   . Colon cancer Father   . Stroke Father   . Heart disease Maternal Grandmother   . Heart disease Paternal Grandmother   . Breast cancer Maternal Aunt     70's    ALLERGIES:  has No Known Allergies.  MEDICATIONS:  Current Outpatient Prescriptions  Medication Sig Dispense Refill  . aspirin EC 81 MG tablet Take 81 mg by mouth daily.    . calcium citrate-vitamin D (CITRACAL+D) 315-200 MG-UNIT per tablet Take 1 tablet by mouth daily.    . cetirizine (ZYRTEC) 10 MG tablet Take 1 tablet (10 mg total) by mouth at bedtime. 30 tablet 3  . magnesium oxide (MAG-OX) 400 MG tablet Take 1 tablet (400 mg total) by mouth daily. (Patient taking differently: Take 400 mg by mouth daily as needed. ) 30 tablet 5  . raloxifene (EVISTA) 60 MG tablet take 1 tablet by mouth once daily 30 tablet 11  . telmisartan (MICARDIS) 20 MG tablet take 1 tablet by mouth once daily 30 tablet 2  . valACYclovir (VALTREX) 500 MG tablet take 1 tablet by mouth twice a day if needed 60 tablet 1   No current facility-administered medications for this visit.       Marland Kitchen  PHYSICAL EXAMINATION: ECOG PERFORMANCE STATUS: 1 - Symptomatic but completely ambulatory  Vitals:   03/14/16 1441  BP: (!) 159/86  Pulse: 73  Temp: 97.7 F (36.5 C)   Filed Weights   03/14/16 1441  Weight: 131 lb (59.4 kg)    GENERAL: Well-nourished well-developed; Alert, no distress and comfortable.  Alone.  Looked much younger for her stated age. EYES: no pallor or icterus OROPHARYNX: no thrush or ulceration; good dentition  NECK: supple, no masses felt LYMPH:  no palpable lymphadenopathy in the cervical, axillary or inguinal regions LUNGS: clear to auscultation and  No wheeze or crackles HEART/CVS: regular rate & rhythm and no murmurs; No lower extremity edema ABDOMEN: abdomen soft, non-tender and normal bowel sounds Musculoskeletal:no cyanosis of  digits and no clubbing  PSYCH: alert & oriented x 3 with fluent speech NEURO: no focal motor/sensory deficits SKIN:  no rashes or significant lesions    LABORATORY DATA:  I have reviewed the data as listed Lab Results  Component Value Date   WBC 7.0 10/02/2015   HGB 12.7 10/02/2015   HCT 36.3 10/02/2015   MCV 88.7 10/02/2015   PLT 154.0 10/02/2015    Recent Labs  09/01/15 1149 10/02/15 1015 11/01/15 0823  NA 135 140 140  K 4.0 4.3 4.1  CL 102 105 106  CO2 28 28 28   GLUCOSE 100* 93 105*  BUN 24* 17 24*  CREATININE 0.90 0.73 0.86  CALCIUM 9.1 9.2 9.2  GFRNONAA 56*  --   --   GFRAA >60  --   --   PROT 6.8 6.5 6.3  ALBUMIN 4.0 3.6 3.7  AST 22 24 17   ALT 13* 15 9  ALKPHOS 91 80 75  BILITOT 0.6 0.7 0.5  BILIDIR  --  0.2  --     RADIOGRAPHIC STUDIES: I have personally reviewed the radiological images as listed and agreed with the findings in the report. Ct Chest Wo Contrast  Result Date: 02/22/2016 CLINICAL DATA:  Cough and hoarseness over the last 3 weeks. Follow up of previously hypermetabolic pulmonary nodules EXAM: CT CHEST WITHOUT CONTRAST TECHNIQUE: Multidetector CT imaging of the chest was performed following the standard protocol without IV contrast. COMPARISON:  Multiple exams, including 11/09/2015 FINDINGS: Cardiovascular: Coronary, aortic arch, and branch vessel atherosclerotic vascular disease. Borderline cardiomegaly. Ascending aortic ectasia, 3.8 cm diameter. Mediastinum/Nodes: No adenopathy identified. Lungs/Pleura: Multiple pulmonary nodules are observed, and by my measurements are stable in size compared to 08/21/15, the earliest available comparison images. Some of these were hypermetabolic, including the lower anterior right upper lobe nodule which measures 1.1 by 0.9 cm on image 68/3 of today's exam, unchanged from 08/21/2015. Accounting for slice selection, the other nodules are also stable. No new nodules are identified. Upper Abdomen: Hypodense lesions in  the liver do not appear changed in size compared to 08/21/15. These were not previously appreciably hypermetabolic. Musculoskeletal: Vertebra plana at T9 and prominent compression fracture at T12, similar to 08/21/15. Lower thoracic kyphosis. IMPRESSION: 1. The various pulmonary nodules, some of which were previously hypermetabolic, are unchanged compared to the earliest available comparison exam of 08/21/2015, indicating 6 months of size stability. Low-grade malignancy, for example from metastatic disease, is still not excluded, correlate with any prior tissue diagnosis/biopsy. If not previously performed, percutaneous biopsy should be strongly considered. 2. Coronary, aortic arch, and branch vessel atherosclerotic vascular disease. 3. Hypodense lesions in the liver are not changed from 08/21/2015 and were not previously hypermetabolic. 4. Stable appearance of the compression fractures  at T9 and T12. Electronically Signed   By: Van Clines M.D.   On: 02/22/2016 16:23    ASSESSMENT & PLAN:   No problem-specific Assessment & Plan notes found for this encounter.     Cammie Sickle, MD 03/14/2016 3:20 PM

## 2016-03-14 NOTE — Assessment & Plan Note (Addendum)
Bilateral lung nodules- question etiology. PET avid. Suspicion of malignancy; not proven. Patient initially declined biopsy; reevaluated with pulmonary in September 2017/CT scan 02/22/2016 stable. Recommend monitoring the lung nodules with a scan in about 4 months and getting worse recommend biopsy  # Scan has been ordered through pulmonary patient will follow-up with me after the scan.

## 2016-03-14 NOTE — Progress Notes (Signed)
SOB on exertion, PET and CT scan.  L:ast CT on 9/5

## 2016-03-25 DIAGNOSIS — H6123 Impacted cerumen, bilateral: Secondary | ICD-10-CM | POA: Diagnosis not present

## 2016-03-25 DIAGNOSIS — H903 Sensorineural hearing loss, bilateral: Secondary | ICD-10-CM | POA: Diagnosis not present

## 2016-04-26 DIAGNOSIS — H2513 Age-related nuclear cataract, bilateral: Secondary | ICD-10-CM | POA: Diagnosis not present

## 2016-05-01 ENCOUNTER — Other Ambulatory Visit: Payer: Self-pay | Admitting: Internal Medicine

## 2016-05-09 ENCOUNTER — Ambulatory Visit (INDEPENDENT_AMBULATORY_CARE_PROVIDER_SITE_OTHER): Payer: Medicare Other

## 2016-05-09 VITALS — BP 140/70 | HR 67 | Temp 98.0°F | Resp 12 | Ht 61.75 in | Wt 131.4 lb

## 2016-05-09 DIAGNOSIS — Z23 Encounter for immunization: Secondary | ICD-10-CM | POA: Diagnosis not present

## 2016-05-09 DIAGNOSIS — Z0000 Encounter for general adult medical examination without abnormal findings: Secondary | ICD-10-CM

## 2016-05-09 NOTE — Patient Instructions (Addendum)
  Lisa Blackwell , Thank you for taking time to come for your Medicare Wellness Visit. I appreciate your ongoing commitment to your health goals. Please review the following plan we discussed and let me know if I can assist you in the future.   These are the goals we discussed: Goals    . Healthy Lifestyle          Stay hydrated and drink plenty of water Stay active and walk for exercise when possible Low carb foods. Vegetables, lean meats.       This is a list of the screening recommended for you and due dates:  Health Maintenance  Topic Date Due  . Shingles Vaccine  05/09/2017*  . Mammogram  10/15/2016  . Tetanus Vaccine  06/12/2023  . Flu Shot  Completed  . DEXA scan (bone density measurement)  Completed  . Pneumonia vaccines  Completed  *Topic was postponed. The date shown is not the original due date.

## 2016-05-09 NOTE — Progress Notes (Signed)
Subjective:   Lisa Blackwell is a 82 y.o. female who presents for an Initial Medicare Annual Wellness Visit.  Review of Systems    No ROS.  Medicare Wellness Visit.  Cardiac Risk Factors include: advanced age (>18men, >19 women);hypertension     Objective:    Today's Vitals   05/09/16 1417  BP: 140/70  Pulse: 67  Resp: 12  Temp: 98 F (36.7 C)  TempSrc: Oral  SpO2: 96%  Weight: 131 lb 6.4 oz (59.6 kg)  Height: 5' 1.75" (1.568 m)   Body mass index is 24.23 kg/m.   Current Medications (verified) Outpatient Encounter Prescriptions as of 05/09/2016  Medication Sig  . aspirin EC 81 MG tablet Take 81 mg by mouth daily.  . calcium citrate-vitamin D (CITRACAL+D) 315-200 MG-UNIT per tablet Take 1 tablet by mouth daily.  . cetirizine (ZYRTEC) 10 MG tablet Take 1 tablet (10 mg total) by mouth at bedtime.  Marland Kitchen ipratropium (ATROVENT) 0.03 % nasal spray Place 2 sprays into both nostrils 2 (two) times daily as needed for rhinitis.  . magnesium oxide (MAG-OX) 400 MG tablet Take 1 tablet (400 mg total) by mouth daily. (Patient taking differently: Take 400 mg by mouth daily as needed. )  . raloxifene (EVISTA) 60 MG tablet take 1 tablet by mouth once daily  . telmisartan (MICARDIS) 20 MG tablet take 1 tablet by mouth once daily  . valACYclovir (VALTREX) 500 MG tablet take 1 tablet by mouth twice a day if needed   No facility-administered encounter medications on file as of 05/09/2016.     Allergies (verified) Patient has no known allergies.   History: Past Medical History:  Diagnosis Date  . Compression fracture    Spine  . Heart murmur   . History of chicken pox   . Hx of colonic polyp   . Hypertension   . Internal hemorrhoid    Past Surgical History:  Procedure Laterality Date  . APPENDECTOMY  1957  . HEMORRHOID BANDING  March 2015   interal rubber band ligation   Family History  Problem Relation Age of Onset  . Breast cancer Mother     65's  . Arthritis Mother     . Colon cancer Father   . Stroke Father   . Heart disease Maternal Grandmother   . Heart disease Paternal Grandmother   . Breast cancer Maternal Aunt     70's   Social History   Occupational History  . Not on file.   Social History Main Topics  . Smoking status: Never Smoker  . Smokeless tobacco: Never Used  . Alcohol use No  . Drug use: No  . Sexual activity: Not on file    Tobacco Counseling Counseling given: Not Answered   Activities of Daily Living In your present state of health, do you have any difficulty performing the following activities: 05/09/2016  Hearing? Y  Vision? N  Difficulty concentrating or making decisions? N  Walking or climbing stairs? N  Dressing or bathing? N  Doing errands, shopping? N  Preparing Food and eating ? N  Using the Toilet? N  In the past six months, have you accidently leaked urine? N  Do you have problems with loss of bowel control? N  Managing your Medications? N  Managing your Finances? N  Housekeeping or managing your Housekeeping? N  Some recent data might be hidden    Immunizations and Health Maintenance Immunization History  Administered Date(s) Administered  . Influenza,inj,Quad PF,36+ Mos 03/18/2014,  02/15/2016  . Influenza-Unspecified 04/04/2013, 04/18/2015  . Pneumococcal Conjugate-13 06/11/2013  . Pneumococcal Polysaccharide-23 05/09/2016  . Td 10/22/2001, 06/11/2013   There are no preventive care reminders to display for this patient.  Patient Care Team: Einar Pheasant, MD as PCP - General (Internal Medicine)  Indicate any recent Medical Services you may have received from other than [facility] providers in the past year (date may be approximate).     Assessment:   This is a routine wellness examination for Lisa Blackwell. The goal of the wellness visit is to assist the patient how to close the gaps in care and create a preventative care plan for the patient.   Taking calcium VIT D as appropriate/Osteoporosis  reviewed.  Medications reviewed; taking without issues or barriers.  Safety issues reviewed; lives [facility].  Smoke detectors in the home. No firearms in the home. Wears seatbelts when driving or riding with others. No violence in the home.  No identified risk were noted; The patient was oriented x 3; appropriate in dress and manner and no objective failures at ADL's or IADL's.   BMI; normal.  Discussed the importance of a healthy diet, water intake and exercise. Educational material provided.  ZOSTAVAX postponed for follow up with insurance.    Health maintenance gaps; closed.  Patient Concerns: None at this time. Follow up with PCP as needed.  Hearing/Vision screen Hearing Screening Comments: Followed by [facility] Hearing aids, bilateral Vision Screening Comments: Followed by [facility] Last OV 03/2016 Wears glasses  Dietary issues and exercise activities discussed: Current Exercise Habits: Home exercise routine, Type of exercise: walking, Time (Minutes): 20, Frequency (Times/Week): 4, Weekly Exercise (Minutes/Week): 80, Intensity: Mild  Goals    . Healthy Lifestyle          Stay hydrated and drink plenty of water Stay active and walk for exercise when possible Low carb foods. Vegetables, lean meats.      Depression Screen PHQ 2/9 Scores 05/09/2016 11/06/2015 08/09/2014 05/06/2013  PHQ - 2 Score 0 0 0 0    Fall Risk Fall Risk  05/09/2016 11/06/2015 08/09/2014 05/06/2013  Falls in the past year? No No No No    Cognitive Function:     6CIT Screen 05/09/2016  What Year? 0 points  What month? 0 points  What time? 0 points  Count back from 20 0 points  Months in reverse 0 points  Repeat phrase 0 points  Total Score 0    Screening Tests Health Maintenance  Topic Date Due  . ZOSTAVAX  05/09/2017 (Originally 10/03/1987)  . MAMMOGRAM  10/15/2016  . TETANUS/TDAP  06/12/2023  . INFLUENZA VACCINE  Completed  . DEXA SCAN  Completed    . PNA vac Low Risk Adult  Completed      Plan:    End of life planning; Advance aging; Advanced directives discussed. Copy of current HCPOA/Living Will requested.  Medicare Attestation I have personally reviewed: The patient's medical and social history Their use of alcohol, tobacco or illicit drugs Their current medications and supplements The patient's functional ability including ADLs,fall risks, home safety risks, cognitive, and hearing and visual impairment Diet and physical activities Evidence for depression   The patient's weight, height, BMI, and visual acuity have been recorded in the chart.  I have made referrals and provided education to the patient based on review of the above and I have provided the patient with a written personalized care plan for preventive services.    During the  course of the visit, Lisa Blackwell was educated and counseled about the following appropriate screening and preventive services:   Vaccines to include Pneumoccal, Influenza, Hepatitis B, Td, Zostavax, HCV  Electrocardiogram  Cardiovascular disease screening  Colorectal cancer screening  Bone density screening  Diabetes screening  Glaucoma screening  Mammography/PAP  Nutrition counseling  Smoking cessation counseling  Patient Instructions (the written plan) were given to the patient.    Varney Biles, LPN   [id number]    Reviewed above.  Agree with plan.   Dr Nicki Reaper

## 2016-05-14 ENCOUNTER — Ambulatory Visit (INDEPENDENT_AMBULATORY_CARE_PROVIDER_SITE_OTHER): Payer: Medicare Other | Admitting: Internal Medicine

## 2016-05-14 ENCOUNTER — Encounter: Payer: Self-pay | Admitting: Internal Medicine

## 2016-05-14 VITALS — BP 134/80 | HR 72 | Temp 98.2°F | Ht 62.0 in | Wt 132.2 lb

## 2016-05-14 DIAGNOSIS — F439 Reaction to severe stress, unspecified: Secondary | ICD-10-CM

## 2016-05-14 DIAGNOSIS — R634 Abnormal weight loss: Secondary | ICD-10-CM | POA: Diagnosis not present

## 2016-05-14 DIAGNOSIS — R918 Other nonspecific abnormal finding of lung field: Secondary | ICD-10-CM | POA: Diagnosis not present

## 2016-05-14 DIAGNOSIS — I10 Essential (primary) hypertension: Secondary | ICD-10-CM

## 2016-05-14 LAB — BASIC METABOLIC PANEL
BUN: 23 mg/dL (ref 6–23)
CALCIUM: 9.4 mg/dL (ref 8.4–10.5)
CO2: 29 mEq/L (ref 19–32)
Chloride: 107 mEq/L (ref 96–112)
Creatinine, Ser: 1.02 mg/dL (ref 0.40–1.20)
GFR: 54.28 mL/min — AB (ref >60.00)
Glucose, Bld: 106 mg/dL — ABNORMAL HIGH (ref 70–99)
Potassium: 4.5 mEq/L (ref 3.5–5.1)
SODIUM: 139 meq/L (ref 135–145)

## 2016-05-14 NOTE — Progress Notes (Signed)
Patient ID: Lisa Blackwell, female   DOB: 1926-03-29, 82 y.o.   MRN: [id number]   Subjective:    Patient ID: Lisa Blackwell, female    DOB: [birth year], 82 y.o.   MRN: [id number]  HPI  Patient here for a scheduled follow up.  Followed by Dr Rogue Bussing for lung nodules.  Last scan stable.  Has seen pulmonary.  Recommended f/u scan in 4 months.  She still has some cough and congestion.  This is stable.  No change.  Breathing overall stable.  No chest pain.  No acid reflux.  No abdominal pain.  Bowels stable.  Some mid to low back pain.  Notices at times.  Desires no further intervention.     Past Medical History:  Diagnosis Date  . Compression fracture    Spine  . Heart murmur   . History of chicken pox   . Hx of colonic polyp   . Hypertension   . Internal hemorrhoid    Past Surgical History:  Procedure Laterality Date  . APPENDECTOMY  1957  . HEMORRHOID BANDING  March 2015   interal rubber band ligation   Family History  Problem Relation Age of Onset  . Breast cancer Mother     24's  . Arthritis Mother   . Colon cancer Father   . Stroke Father   . Heart disease Maternal Grandmother   . Heart disease Paternal Grandmother   . Breast cancer Maternal Aunt     70's   Social History   Social History  . Marital status: Married    Spouse name: N/A  . Number of children: 1  . Years of education: N/A   Social History Main Topics  . Smoking status: Never Smoker  . Smokeless tobacco: Never Used  . Alcohol use No  . Drug use: No  . Sexual activity: Not Asked   Other Topics Concern  . None   Social History Narrative  . None    Outpatient Encounter Prescriptions as of 05/14/2016  Medication Sig  . aspirin EC 81 MG tablet Take 81 mg by mouth daily.  . calcium citrate-vitamin D (CITRACAL+D) 315-200 MG-UNIT per tablet Take 1 tablet by mouth daily.  . cetirizine (ZYRTEC) 10 MG tablet Take 1 tablet (10 mg total) by mouth at bedtime.  Marland Kitchen ipratropium (ATROVENT) 0.03 %  nasal spray Place 2 sprays into both nostrils 2 (two) times daily as needed for rhinitis.  . magnesium oxide (MAG-OX) 400 MG tablet Take 1 tablet (400 mg total) by mouth daily. (Patient taking differently: Take 400 mg by mouth daily as needed. )  . raloxifene (EVISTA) 60 MG tablet take 1 tablet by mouth once daily  . telmisartan (MICARDIS) 20 MG tablet take 1 tablet by mouth once daily  . valACYclovir (VALTREX) 500 MG tablet take 1 tablet by mouth twice a day if needed   No facility-administered encounter medications on file as of 05/14/2016.     Review of Systems  Constitutional: Negative for appetite change and unexpected weight change.  HENT: Positive for congestion. Negative for sinus pressure.   Respiratory: Positive for cough. Negative for chest tightness and shortness of breath.   Cardiovascular: Negative for chest pain, palpitations and leg swelling.  Gastrointestinal: Negative for abdominal pain, diarrhea, nausea and vomiting.  Musculoskeletal: Negative for joint swelling and myalgias.  Skin: Negative for color change and rash.  Neurological: Negative for dizziness, light-headedness and headaches.  Psychiatric/Behavioral: Negative for agitation and dysphoric mood.  Objective:    Physical Exam  Constitutional: She appears well-developed and well-nourished. No distress.  HENT:  Nose: Nose normal.  Mouth/Throat: Oropharynx is clear and moist.  Neck: Neck supple. No thyromegaly present.  Cardiovascular: Normal rate and regular rhythm.   Pulmonary/Chest: Breath sounds normal. No respiratory distress. She has no wheezes.  Abdominal: Soft. Bowel sounds are normal. There is no tenderness.  Musculoskeletal: She exhibits no edema or tenderness.  Lymphadenopathy:    She has no cervical adenopathy.  Skin: No rash noted. No erythema.  Psychiatric: She has a normal mood and affect. Her behavior is normal.    BP 134/80   Pulse 72   Temp 98.2 F (36.8 C) (Oral)   Ht 5\' 2"   (1.575 m)   Wt 132 lb 3.2 oz (60 kg)   SpO2 97%   BMI 24.18 kg/m  Wt Readings from Last 3 Encounters:  05/14/16 132 lb 3.2 oz (60 kg)  05/09/16 131 lb 6.4 oz (59.6 kg)  03/14/16 131 lb (59.4 kg)     Lab Results  Component Value Date   WBC 7.0 10/02/2015   HGB 12.7 10/02/2015   HCT 36.3 10/02/2015   PLT 154.0 10/02/2015   GLUCOSE 106 (H) 05/14/2016   CHOL 179 11/01/2015   TRIG 94.0 11/01/2015   HDL 69.10 11/01/2015   LDLCALC 91 11/01/2015   ALT 9 11/01/2015   AST 17 11/01/2015   NA 139 05/14/2016   K 4.5 05/14/2016   CL 107 05/14/2016   CREATININE 1.02 05/14/2016   BUN 23 05/14/2016   CO2 29 05/14/2016   TSH 1.12 08/03/2015    Ct Chest Wo Contrast  Result Date: 02/22/2016 CLINICAL DATA:  Cough and hoarseness over the last 3 weeks. Follow up of previously hypermetabolic pulmonary nodules EXAM: CT CHEST WITHOUT CONTRAST TECHNIQUE: Multidetector CT imaging of the chest was performed following the standard protocol without IV contrast. COMPARISON:  Multiple exams, including 11/09/2015 FINDINGS: Cardiovascular: Coronary, aortic arch, and branch vessel atherosclerotic vascular disease. Borderline cardiomegaly. Ascending aortic ectasia, 3.8 cm diameter. Mediastinum/Nodes: No adenopathy identified. Lungs/Pleura: Multiple pulmonary nodules are observed, and by my measurements are stable in size compared to 08/21/15, the earliest available comparison images. Some of these were hypermetabolic, including the lower anterior right upper lobe nodule which measures 1.1 by 0.9 cm on image 68/3 of today's exam, unchanged from 08/21/2015. Accounting for slice selection, the other nodules are also stable. No new nodules are identified. Upper Abdomen: Hypodense lesions in the liver do not appear changed in size compared to 08/21/15. These were not previously appreciably hypermetabolic. Musculoskeletal: Vertebra plana at T9 and prominent compression fracture at T12, similar to 08/21/15. Lower thoracic  kyphosis. IMPRESSION: 1. The various pulmonary nodules, some of which were previously hypermetabolic, are unchanged compared to the earliest available comparison exam of 08/21/2015, indicating 6 months of size stability. Low-grade malignancy, for example from metastatic disease, is still not excluded, correlate with any prior tissue diagnosis/biopsy. If not previously performed, percutaneous biopsy should be strongly considered. 2. Coronary, aortic arch, and branch vessel atherosclerotic vascular disease. 3. Hypodense lesions in the liver are not changed from 08/21/2015 and were not previously hypermetabolic. 4. Stable appearance of the compression fractures at T9 and T12. Electronically Signed   By: Van Clines M.D.   On: 02/22/2016 16:23       Assessment & Plan:   Problem List Items Addressed This Visit    Essential hypertension, benign - Primary    Blood pressure on my  recheck elevated - 158/82.  Have her spot check her pressure.  Get her back in soon to reassess.  Follow metabolic panel.       Relevant Orders   Basic metabolic panel (Completed)   Loss of weight    Weight stable from previous check.  Follow.       Lung nodules    Being followed by pulmonary and oncology.  02/22/16 CT scan stable.  Recommended f/u scan in 4 months.  Has f/u scheduled in 06/2015.        Stress    Doing well.  Handling things relatively well.  Follow.             Einar Pheasant, MD

## 2016-05-14 NOTE — Progress Notes (Signed)
Pre visit review using our clinic review tool, if applicable. No additional management support is needed unless otherwise documented below in the visit note. 

## 2016-05-15 ENCOUNTER — Encounter: Payer: Self-pay | Admitting: Internal Medicine

## 2016-05-18 ENCOUNTER — Encounter: Payer: Self-pay | Admitting: Internal Medicine

## 2016-05-18 NOTE — Assessment & Plan Note (Signed)
Blood pressure on my recheck elevated - 158/82.  Have her spot check her pressure.  Get her back in soon to reassess.  Follow metabolic panel.

## 2016-05-18 NOTE — Assessment & Plan Note (Signed)
Being followed by pulmonary and oncology.  02/22/16 CT scan stable.  Recommended f/u scan in 4 months.  Has f/u scheduled in 06/2015.

## 2016-05-18 NOTE — Assessment & Plan Note (Signed)
Doing well.  Handling things relatively well.  Follow.

## 2016-05-18 NOTE — Assessment & Plan Note (Signed)
Weight stable from previous check.  Follow.  

## 2016-06-10 ENCOUNTER — Telehealth: Payer: Self-pay | Admitting: Internal Medicine

## 2016-06-10 NOTE — Telephone Encounter (Signed)
Pt calling stating she received a letter stating that the CT we are asking her to do on 06/25/16  That it will not be covered by her insurance  Would like to know what to do now  Please advise

## 2016-06-11 NOTE — Telephone Encounter (Signed)
Pt's insurance denied CT Chest, b/c pt has not had biopsy.  Dr. Mortimer Fries is going to contact patient about this issue.  Message forwarded to Dr. Mortimer Fries to advise patient of this issue. Rhonda J Cobb

## 2016-06-11 NOTE — Telephone Encounter (Signed)
I will have to start the process over again. [employer] only allows physician 5 days to call in for a peer to peer, which that time has expired. I wil start process over this afternoon after Dr. Ashby Dawes has finished clinic. Rhonda J Cobb

## 2016-06-11 NOTE — Telephone Encounter (Signed)
Who do in need to call about getting CT chest? Patient wants biopsy if needed

## 2016-06-11 NOTE — Telephone Encounter (Signed)
Rhonda please advise if you've had any pre cert or anything on this pt. Thanks.

## 2016-06-13 DIAGNOSIS — I340 Nonrheumatic mitral (valve) insufficiency: Secondary | ICD-10-CM | POA: Diagnosis not present

## 2016-06-13 DIAGNOSIS — E782 Mixed hyperlipidemia: Secondary | ICD-10-CM | POA: Diagnosis not present

## 2016-06-13 DIAGNOSIS — I10 Essential (primary) hypertension: Secondary | ICD-10-CM | POA: Diagnosis not present

## 2016-06-13 DIAGNOSIS — R001 Bradycardia, unspecified: Secondary | ICD-10-CM | POA: Diagnosis not present

## 2016-06-14 NOTE — Telephone Encounter (Signed)
Attempted to obtain pre cert for CT Chest without contrast again with [employer]. Unable to obtain approval. Case has gone to a Peer to Peer with [employer] ([employer]). Case will close on 06/19/16.   Message sent to Dr. Mortimer Fries to contact [employer] for peer to peer on this Case.  [employer] phone # [phone] Opt 1.    Physician will need pt's name, DOB: 03-Nov-1925, [place] ID # [id number]. Procedure Code: [id number] CT Chest Without Contrast, was scheduled for 06/25/16, however, I changed the DOS for 07/02/16.    Pt is aware of process and that her CT has been r/s for 07/02/16 at [facility] to see if authorization can be obtained.   Dr. Mortimer Fries this will need to be completed before 06/19/16.  I have also place this file in your folder with the above information.   Please advise. Rhonda J Cobb

## 2016-06-25 ENCOUNTER — Ambulatory Visit: Payer: Medicare Other

## 2016-06-27 ENCOUNTER — Ambulatory Visit: Payer: Medicare Other | Admitting: Internal Medicine

## 2016-07-01 ENCOUNTER — Ambulatory Visit (INDEPENDENT_AMBULATORY_CARE_PROVIDER_SITE_OTHER): Payer: Medicare Other | Admitting: Internal Medicine

## 2016-07-01 ENCOUNTER — Ambulatory Visit (INDEPENDENT_AMBULATORY_CARE_PROVIDER_SITE_OTHER): Payer: Medicare Other

## 2016-07-01 ENCOUNTER — Encounter: Payer: Self-pay | Admitting: Internal Medicine

## 2016-07-01 VITALS — BP 148/76 | HR 69 | Temp 98.2°F | Wt 131.2 lb

## 2016-07-01 DIAGNOSIS — M546 Pain in thoracic spine: Secondary | ICD-10-CM | POA: Diagnosis not present

## 2016-07-01 DIAGNOSIS — R918 Other nonspecific abnormal finding of lung field: Secondary | ICD-10-CM

## 2016-07-01 DIAGNOSIS — I10 Essential (primary) hypertension: Secondary | ICD-10-CM | POA: Diagnosis not present

## 2016-07-01 DIAGNOSIS — R2681 Unsteadiness on feet: Secondary | ICD-10-CM | POA: Diagnosis not present

## 2016-07-01 DIAGNOSIS — M549 Dorsalgia, unspecified: Secondary | ICD-10-CM | POA: Insufficient documentation

## 2016-07-01 DIAGNOSIS — R0989 Other specified symptoms and signs involving the circulatory and respiratory systems: Secondary | ICD-10-CM

## 2016-07-01 DIAGNOSIS — R634 Abnormal weight loss: Secondary | ICD-10-CM | POA: Diagnosis not present

## 2016-07-01 DIAGNOSIS — R6889 Other general symptoms and signs: Secondary | ICD-10-CM | POA: Diagnosis not present

## 2016-07-01 NOTE — Progress Notes (Signed)
Patient ID: Lisa Blackwell, female   DOB: Oct 21, 1925, 83 y.o.   MRN: [id number]   Subjective:    Patient ID: Lisa Blackwell, female    DOB: [birth year], 83 y.o.   MRN: [id number]  HPI  Patient here for a scheduled follow up.  She reports that she is having increased back pain - mid back pain.  Has worsened recently.  No position aggravates.  Is there with varying positions.  No lower back pain or pain radiating down legs.  No known injury or trauma.  She does report noticing being a little more unsteady with her walking.  No dizziness or light headedness.  No falls.  No headache.  No chest pain.  Breathing overall relatively stable.  Clears her throat.  Planning for f/u CT chest tomorrow.  Due to see pulmonary after the CT.  No abdominal pain or cramping.  Bowels stable.     Past Medical History:  Diagnosis Date  . Compression fracture    Spine  . Heart murmur   . History of chicken pox   . Hx of colonic polyp   . Hypertension   . Internal hemorrhoid    Past Surgical History:  Procedure Laterality Date  . APPENDECTOMY  1957  . HEMORRHOID BANDING  March 2015   interal rubber band ligation   Family History  Problem Relation Age of Onset  . Breast cancer Mother     55's  . Arthritis Mother   . Colon cancer Father   . Stroke Father   . Heart disease Maternal Grandmother   . Heart disease Paternal Grandmother   . Breast cancer Maternal Aunt     70's   Social History   Social History  . Marital status: Married    Spouse name: N/A  . Number of children: 1  . Years of education: N/A   Social History Main Topics  . Smoking status: Never Smoker  . Smokeless tobacco: Never Used  . Alcohol use No  . Drug use: No  . Sexual activity: Not Asked   Other Topics Concern  . None   Social History Narrative  . None    Outpatient Encounter Prescriptions as of 07/01/2016  Medication Sig  . aspirin EC 81 MG tablet Take 81 mg by mouth daily.  . calcium citrate-vitamin D  (CITRACAL+D) 315-200 MG-UNIT per tablet Take 1 tablet by mouth daily.  Marland Kitchen ipratropium (ATROVENT) 0.03 % nasal spray Place 2 sprays into both nostrils 2 (two) times daily as needed for rhinitis.  . magnesium oxide (MAG-OX) 400 MG tablet Take 1 tablet (400 mg total) by mouth daily. (Patient taking differently: Take 400 mg by mouth daily as needed. )  . raloxifene (EVISTA) 60 MG tablet take 1 tablet by mouth once daily  . telmisartan (MICARDIS) 20 MG tablet take 1 tablet by mouth once daily  . valACYclovir (VALTREX) 500 MG tablet take 1 tablet by mouth twice a day if needed  . [DISCONTINUED] cetirizine (ZYRTEC) 10 MG tablet Take 1 tablet (10 mg total) by mouth at bedtime.   No facility-administered encounter medications on file as of 07/01/2016.     Review of Systems  Constitutional: Negative for appetite change and unexpected weight change.  HENT: Negative for congestion and sinus pressure.   Respiratory: Negative for chest tightness and wheezing.        Breathing overall relatively stable.   Cardiovascular: Negative for chest pain, palpitations and leg swelling.  Gastrointestinal: Negative for abdominal pain, diarrhea,  nausea and vomiting.  Genitourinary: Negative for difficulty urinating and dysuria.  Musculoskeletal: Positive for back pain. Negative for joint swelling.  Skin: Negative for color change and rash.  Neurological: Negative for dizziness, light-headedness and headaches.  Psychiatric/Behavioral: Negative for agitation and dysphoric mood.       Objective:     Physical Exam  Constitutional: She appears well-developed and well-nourished. No distress.  HENT:  Nose: Nose normal.  Mouth/Throat: Oropharynx is clear and moist.  Neck: Neck supple. No thyromegaly present.  Cardiovascular: Normal rate and regular rhythm.   Pulmonary/Chest: Breath sounds normal. No respiratory distress. She has no wheezes.  Abdominal: Soft. Bowel sounds are normal. There is no tenderness.    Musculoskeletal: She exhibits no edema or tenderness.  Lymphadenopathy:    She has no cervical adenopathy.  Skin: No rash noted. No erythema.  Psychiatric: She has a normal mood and affect. Her behavior is normal.    BP (!) 148/76   Pulse 69   Temp 98.2 F (36.8 C) (Oral)   Wt 131 lb 3.2 oz (59.5 kg)   SpO2 96%   BMI 24.00 kg/m  Wt Readings from Last 3 Encounters:  07/01/16 131 lb 3.2 oz (59.5 kg)  05/14/16 132 lb 3.2 oz (60 kg)  05/09/16 131 lb 6.4 oz (59.6 kg)     Lab Results  Component Value Date   WBC 7.0 10/02/2015   HGB 12.7 10/02/2015   HCT 36.3 10/02/2015   PLT 154.0 10/02/2015   GLUCOSE 106 (H) 05/14/2016   CHOL 179 11/01/2015   TRIG 94.0 11/01/2015   HDL 69.10 11/01/2015   LDLCALC 91 11/01/2015   ALT 9 11/01/2015   AST 17 11/01/2015   NA 139 05/14/2016   K 4.5 05/14/2016   CL 107 05/14/2016   CREATININE 1.02 05/14/2016   BUN 23 05/14/2016   CO2 29 05/14/2016   TSH 1.12 08/03/2015    Ct Chest Wo Contrast  Result Date: 02/22/2016 CLINICAL DATA:  Cough and hoarseness over the last 3 weeks. Follow up of previously hypermetabolic pulmonary nodules EXAM: CT CHEST WITHOUT CONTRAST TECHNIQUE: Multidetector CT imaging of the chest was performed following the standard protocol without IV contrast. COMPARISON:  Multiple exams, including 11/09/2015 FINDINGS: Cardiovascular: Coronary, aortic arch, and branch vessel atherosclerotic vascular disease. Borderline cardiomegaly. Ascending aortic ectasia, 3.8 cm diameter. Mediastinum/Nodes: No adenopathy identified. Lungs/Pleura: Multiple pulmonary nodules are observed, and by my measurements are stable in size compared to 08/21/15, the earliest available comparison images. Some of these were hypermetabolic, including the lower anterior right upper lobe nodule which measures 1.1 by 0.9 cm on image 68/3 of today's exam, unchanged from 08/21/2015. Accounting for slice selection, the other nodules are also stable. No new nodules are  identified. Upper Abdomen: Hypodense lesions in the liver do not appear changed in size compared to 08/21/15. These were not previously appreciably hypermetabolic. Musculoskeletal: Vertebra plana at T9 and prominent compression fracture at T12, similar to 08/21/15. Lower thoracic kyphosis. IMPRESSION: 1. The various pulmonary nodules, some of which were previously hypermetabolic, are unchanged compared to the earliest available comparison exam of 08/21/2015, indicating 6 months of size stability. Low-grade malignancy, for example from metastatic disease, is still not excluded, correlate with any prior tissue diagnosis/biopsy. If not previously performed, percutaneous biopsy should be strongly considered. 2. Coronary, aortic arch, and branch vessel atherosclerotic vascular disease. 3. Hypodense lesions in the liver are not changed from 08/21/2015 and were not previously hypermetabolic. 4. Stable appearance of the compression fractures at  T9 and T12. Electronically Signed   By: Van Clines M.D.   On: 02/22/2016 16:23       Assessment & Plan:   Problem List Items Addressed This Visit    Back pain    With increased thoracic back pain.  Persistent.  Will xray.  Further w/up pending results.        Relevant Orders   DG Thoracic Spine 2 View (Completed)   Essential hypertension, benign - Primary    Blood pressure on outside checks averaging 130-140/80s.  Saw cardiology.  ECHO - EF 40% with moderate mitral and mild tricuspid and aortic insufficiency.  Will hold on making changes in her medication.  Have her spot check her pressure.        Relevant Orders   Basic metabolic panel   Loss of weight    Weight stable last few checks.  Follow.       Lung nodules    Being followed by pulmonary and oncology.  Planning for f/u CT tomorrow.  Has f/u with pulmonary after CT.        Throat clearing    Has seen ENT.  Being followed by pulmonary.  Planning for f/u chest CT tomorrow.  F/u with pulmonary  after scan.        Unsteady gait    Has noticed some unsteadiness with ambulation.  No dizziness.  No light headedness.  Will have physical therapy evaluate and treat for unsteady gait.        Relevant Orders   Ambulatory referral to Physical Therapy       Einar Pheasant, MD

## 2016-07-01 NOTE — Progress Notes (Signed)
Pre visit review using our clinic review tool, if applicable. No additional management support is needed unless otherwise documented below in the visit note. 

## 2016-07-02 ENCOUNTER — Ambulatory Visit
Admission: RE | Admit: 2016-07-02 | Discharge: 2016-07-02 | Disposition: A | Discharge status: Home / Self Care | Payer: Medicare Other | Source: Ambulatory Visit | Attending: Internal Medicine | Admitting: Internal Medicine

## 2016-07-02 ENCOUNTER — Encounter: Payer: Self-pay | Admitting: Internal Medicine

## 2016-07-02 DIAGNOSIS — R918 Other nonspecific abnormal finding of lung field: Secondary | ICD-10-CM | POA: Diagnosis not present

## 2016-07-02 DIAGNOSIS — R2681 Unsteadiness on feet: Secondary | ICD-10-CM | POA: Insufficient documentation

## 2016-07-02 LAB — BASIC METABOLIC PANEL
BUN: 20 mg/dL (ref 6–23)
CHLORIDE: 107 meq/L (ref 96–112)
CO2: 29 mEq/L (ref 19–32)
CREATININE: 0.82 mg/dL (ref 0.40–1.20)
Calcium: 9.6 mg/dL (ref 8.4–10.5)
GFR: 69.81 mL/min (ref >60.00)
Glucose, Bld: 92 mg/dL (ref 70–99)
Potassium: 4.4 mEq/L (ref 3.5–5.1)
Sodium: 142 mEq/L (ref 135–145)

## 2016-07-02 NOTE — Assessment & Plan Note (Signed)
Has seen ENT.  Being followed by pulmonary.  Planning for f/u chest CT tomorrow.  F/u with pulmonary after scan.

## 2016-07-02 NOTE — Assessment & Plan Note (Signed)
Blood pressure on outside checks averaging 130-140/80s.  Saw cardiology.  ECHO - EF 40% with moderate mitral and mild tricuspid and aortic insufficiency.  Will hold on making changes in her medication.  Have her spot check her pressure.

## 2016-07-02 NOTE — Assessment & Plan Note (Signed)
Being followed by pulmonary and oncology.  Planning for f/u CT tomorrow.  Has f/u with pulmonary after CT.

## 2016-07-02 NOTE — Assessment & Plan Note (Signed)
With increased thoracic back pain.  Persistent.  Will xray.  Further w/up pending results.

## 2016-07-02 NOTE — Assessment & Plan Note (Signed)
Weight stable last few checks.  Follow.

## 2016-07-02 NOTE — Assessment & Plan Note (Signed)
Has noticed some unsteadiness with ambulation.  No dizziness.  No light headedness.  Will have physical therapy evaluate and treat for unsteady gait.

## 2016-07-03 ENCOUNTER — Encounter: Payer: Self-pay | Admitting: Internal Medicine

## 2016-07-04 ENCOUNTER — Encounter: Payer: Self-pay | Admitting: Internal Medicine

## 2016-07-04 ENCOUNTER — Ambulatory Visit (INDEPENDENT_AMBULATORY_CARE_PROVIDER_SITE_OTHER): Payer: Medicare Other | Admitting: Internal Medicine

## 2016-07-04 VITALS — BP 142/88 | HR 70 | Wt 130.0 lb

## 2016-07-04 DIAGNOSIS — J309 Allergic rhinitis, unspecified: Secondary | ICD-10-CM

## 2016-07-04 MED ORDER — FLUTICASONE PROPIONATE 50 MCG/ACT NA SUSP: 1.0000 | Freq: Every day | NASAL | 2 refills | Status: DC

## 2016-07-04 MED ORDER — CETIRIZINE HCL 10 MG PO TABS: 10.0000 mg | ORAL_TABLET | Freq: Every day | ORAL | Status: AC

## 2016-07-04 NOTE — Progress Notes (Signed)
[facility] Consultation      Date: 07/04/2016,   MRN# [id number] Lisa Blackwell 07-14-1925 Code Status:  Code Status History    This patient does not have a recorded code status. Please follow your organizational policy for patients in this situation.     Hosp day:@LENGTHOFSTAYDAYS @ Referring MD: @ATDPROV @     PCP:      AdmissionWeight: 130 lb (59 kg)                 CurrentWeight: 130 lb (59 kg) Lisa Blackwell is a 83 y.o. old female seen in consultation for lung nodules at the request of Dr Nicki Reaper     CHIEF COMPLAINT:   Follow up pulmonary nodules    HISTORY OF PRESENT ILLNESS   82 yo pleasant white female seen today for follow up for abnormal CT chest with B/L Pulmonary nodules  No signs of infection at this time, no lower ext swelling No acute resp issues  Patient had CXR in Feb which led to CT  Chest and PET scan and repeat CT chest most recent on 10/2015 and PET scan revealed hypermetabolic activity There were 4 nodules-stable appearance after 3 month interval back on May  Currently, CT chest shows stability of Pulmonary nodules over the past 1 year  Has some post nasal drip and constant throat clearing   Current Medication:  Current Outpatient Prescriptions:  .  aspirin EC 81 MG tablet, Take 81 mg by mouth daily., Disp: , Rfl:  .  calcium citrate-vitamin D (CITRACAL+D) 315-200 MG-UNIT per tablet, Take 1 tablet by mouth daily., Disp: , Rfl:  .  ipratropium (ATROVENT) 0.03 % nasal spray, Place 2 sprays into both nostrils 2 (two) times daily as needed for rhinitis., Disp: , Rfl:  .  magnesium oxide (MAG-OX) 400 MG tablet, Take 1 tablet (400 mg total) by mouth daily. (Patient taking differently: Take 400 mg by mouth daily as needed. ), Disp: 30 tablet, Rfl: 5 .  raloxifene (EVISTA) 60 MG tablet, take 1 tablet by mouth once daily, Disp: 30 tablet, Rfl: 11 .  telmisartan (MICARDIS) 20 MG tablet, take 1 tablet by mouth once daily, Disp: 30  tablet, Rfl: 2 .  valACYclovir (VALTREX) 500 MG tablet, take 1 tablet by mouth twice a day if needed, Disp: 60 tablet, Rfl: 1    ALLERGIES   Patient has no known allergies.     REVIEW OF SYSTEMS   Review of Systems  Constitutional: Negative for chills, diaphoresis, fever, malaise/fatigue and weight loss.  HENT: Negative for congestion and hearing loss.   Respiratory: Negative for cough, hemoptysis, sputum production, shortness of breath and wheezing.   Cardiovascular: Negative for chest pain, palpitations, orthopnea and leg swelling.  Gastrointestinal: Negative for heartburn, nausea and vomiting.  Skin: Negative for rash.  Neurological: Negative for weakness and headaches.  All other systems reviewed and are negative.    VS: BP (!) 142/88 (BP Location: Left Arm, Cuff Size: Normal)   Pulse 70   Wt 130 lb (59 kg)   SpO2 99%   BMI 23.78 kg/m      PHYSICAL EXAM  Physical Exam  Constitutional: She is oriented to person, place, and time. No distress.  HENT:  Mouth/Throat: No oropharyngeal exudate.  Eyes: No scleral icterus.  Cardiovascular: Normal rate, regular rhythm and normal heart sounds.   No murmur heard. Pulmonary/Chest: Effort normal and breath sounds normal. No stridor. No respiratory distress. She has no wheezes. She has no rales.  Musculoskeletal: Normal range of motion. She exhibits no edema.  Neurological: She is alert and oriented to person, place, and time. No cranial nerve deficit.  Skin: Skin is warm. She is not diaphoretic.  Psychiatric: She has a normal mood and affect.             IMAGING    Dg Thoracic Spine 2 View  Result Date: 07/01/2016 CLINICAL DATA:  Back pain EXAM: THORACIC SPINE 2 VIEWS COMPARISON:  8/31/ 17 FINDINGS: Three views of thoracic spine submitted. There is diffuse osteopenia. No acute fracture or subluxation. Stable vertebral plana T9 vertebral body. Stable compression fracture of T12 vertebral body. Lower thoracic kyphosis  again noted. IMPRESSION: There is diffuse osteopenia. No acute fracture or subluxation. Stable vertebral plana T9 vertebral body. Stable compression fracture of T12 vertebral body. Lower thoracic kyphosis again noted. Electronically Signed   By: Lahoma Crocker M.D.   On: 07/01/2016 16:41   Ct Chest Wo Contrast  Result Date: 07/02/2016 CLINICAL DATA:  Bilateral pulmonary nodules. EXAM: CT CHEST WITHOUT CONTRAST TECHNIQUE: Multidetector CT imaging of the chest was performed following the standard protocol without IV contrast. COMPARISON:  CT 02/22/2016, PET-CT 09/07/2015, CT 08/21/2015 FINDINGS: Cardiovascular: No significant vascular findings. Normal heart size. No pericardial effusion. Mediastinum/Nodes: No axillary supraclavicular adenopathy. No mediastinal hilar adenopathy. No pericardial fluid. Lungs/Pleura: Bilateral pulmonary nodules again demonstrated. For example 12 mm in LEFT upper lobe nodule (image 28, series 3) not changed from 11 mm. RIGHT middle lobe nodule measures 10 mm x 8 mm not changed from 9 mm x 11 mm (image 70, series 3) LEFT lower lobe nodule measures 7 mm in (image 8, series 3 unchanged from 7 mm. No new pulmonary nodules. Upper Abdomen: Low-density lesion in the liver appears benign. Adrenal glands normal. Musculoskeletal: No aggressive osseous lesion. IMPRESSION: 1. Stable bilateral pulmonary nodules compared to PET-CT and CTs dating back to 08/21/2015. Findings favor benign etiology. 2. Recommend continued surveillance with longer time interval. Electronically Signed   By: Suzy Bouchard M.D.   On: 07/02/2016 17:06   Images reviewed 07/04/2016 Stable pulmonary nodules IMPRESSION: 1. Stable bilateral pulmonary nodules compared to PET-CT and CTs dating back to 08/21/2015. Findings favor benign etiology. 2. Recommend continued surveillance with longer time interval.   ASSESSMENT/PLAN   83 yo very pleasant white female seen for follow up for abnormal CT chest with b/l Pulomonary  Lung nodules  As listed below, The RUL, RML adn LUL nodules are hypermetabolic on PET scan Now has symptoms of allergic rhinits  Nodule#1  LUL 10-12 mm stable after 1 year Nodule#2  LLL 7 mm stable after 1 year Nodule#3  RUL 10 mm stable after 1 year Nodule#4  RML 7 mm stable after 1 year    1.CT chest in 1 year 2.start flonase 3.start zyrtec  The Patient requires high complexity decision making for assessment and support, frequent evaluation and titration of therapies, application of advanced monitoring technologies and extensive interpretation of multiple databases.   Patient satisfied with Plan of action and management. All questions answered  Corrin Parker, M.D.  [facility]  Medical Director [place] Director [facility]

## 2016-07-04 NOTE — Patient Instructions (Signed)
Start flonase Start zyrtec Follow up CT chest in 1 year

## 2016-07-05 DIAGNOSIS — R2681 Unsteadiness on feet: Secondary | ICD-10-CM | POA: Diagnosis not present

## 2016-07-09 DIAGNOSIS — R2681 Unsteadiness on feet: Secondary | ICD-10-CM | POA: Diagnosis not present

## 2016-07-15 ENCOUNTER — Inpatient Hospital Stay: Payer: Medicare Other | Attending: Internal Medicine | Admitting: Internal Medicine

## 2016-07-15 VITALS — BP 170/67 | HR 73 | Temp 97.7°F | Wt 130.1 lb

## 2016-07-15 DIAGNOSIS — Z86010 Personal history of colonic polyps: Secondary | ICD-10-CM | POA: Insufficient documentation

## 2016-07-15 DIAGNOSIS — Z7982 Long term (current) use of aspirin: Secondary | ICD-10-CM | POA: Diagnosis not present

## 2016-07-15 DIAGNOSIS — Z79899 Other long term (current) drug therapy: Secondary | ICD-10-CM | POA: Diagnosis not present

## 2016-07-15 DIAGNOSIS — I10 Essential (primary) hypertension: Secondary | ICD-10-CM | POA: Diagnosis not present

## 2016-07-15 DIAGNOSIS — R918 Other nonspecific abnormal finding of lung field: Secondary | ICD-10-CM | POA: Insufficient documentation

## 2016-07-15 NOTE — Progress Notes (Signed)
Patient here today for follow up.  Patient states no new concerns today  

## 2016-07-15 NOTE — Assessment & Plan Note (Signed)
Bilateral lung nodules- question etiology. PET avid. Suspicion of malignancy; not proven [no Biopsy done]. CT scan January 2018 shows bilateral approximately 1 cm lung nodules.   # Patient's bilateral lung nodules have shown stability over 1 year. The possible etiology- carcinoid versus low-grade malignancy; versus benign causes was again discussed with the patient at length.   # Scan has been ordered through pulmonary in 1 year [Dr.Kasa] patient will follow-up with me after the scan. No labs.  # I reviewed the blood work- with the patient in detail; also reviewed the imaging independently [as summarized above]; and with the patient in detail.

## 2016-07-15 NOTE — Progress Notes (Signed)
[facility] CONSULT NOTE  Patient Care Team: Einar Pheasant, MD as PCP - General (Internal Medicine)  CHIEF COMPLAINTS/PURPOSE OF CONSULTATION:   # MARCH 2017- BILATERAL LUNG NODULES/ Liver cysts? [cough/abdominal bloating ]; PET avid- suspicious of malignancy; tumor marker-N. AUG 31st CT scan-STABLE [Dr.Kasa]; JAN 5th 2018- CT STABLE lung nodules.   # Colonoscopy- jan 2014/mammogram in January 2016 that was normal   HISTORY OF PRESENTING ILLNESS:  Lisa Blackwell 83 y.o.  female  Patient  Who is fairly healthy/ active for age-  with bilateral lung nodules- is here for follow-up.    She continues to have chronic backache. Not any worse. She continues to deny  any unusual shortness of breath or chest pain. Otherwise no new symptoms.  ROS: A complete 10 point review of system is done which is negative except mentioned above in history of present illness  MEDICAL HISTORY:  Past Medical History:  Diagnosis Date  . Compression fracture    Spine  . Heart murmur   . History of chicken pox   . Hx of colonic polyp   . Hypertension   . Internal hemorrhoid     SURGICAL HISTORY: Past Surgical History:  Procedure Laterality Date  . APPENDECTOMY  1957  . 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  She lives with her husband at home.  She still drives.  Social History   Social History  . Marital status: Married    Spouse name: N/A  . Number of children: 1  . Years of education: N/A   Occupational History  . Not on file.   Social History Main Topics  . Smoking status: Never Smoker  . Smokeless tobacco: Never Used  . Alcohol use No  . Drug use: No  . Sexual activity: Not on file   Other Topics Concern  . Not on file   Social History Narrative  . No narrative on file    FAMILY HISTORY: Family History  Problem Relation Age of Onset  . Breast cancer Mother     5's  .  Arthritis Mother   . Colon cancer Father   . Stroke Father   . Heart disease Maternal Grandmother   . Heart disease Paternal Grandmother   . Breast cancer Maternal Aunt     70's    ALLERGIES:  has No Known Allergies.  MEDICATIONS:  Current Outpatient Prescriptions  Medication Sig Dispense Refill  . aspirin EC 81 MG tablet Take 81 mg by mouth daily.    . calcium citrate-vitamin D (CITRACAL+D) 315-200 MG-UNIT per tablet Take 1 tablet by mouth daily.    . fluticasone (FLONASE) 50 MCG/ACT nasal spray Place 1 spray into both nostrils daily. 1 g 2  . magnesium oxide (MAG-OX) 400 MG tablet Take 1 tablet (400 mg total) by mouth daily. (Patient taking differently: Take 400 mg by mouth daily as needed. ) 30 tablet 5  . raloxifene (EVISTA) 60 MG tablet take 1 tablet by mouth once daily 30 tablet 11  . telmisartan (MICARDIS) 20 MG tablet take 1 tablet by mouth once daily 30 tablet 2  . valACYclovir (VALTREX) 500 MG tablet take 1 tablet by mouth twice a day if needed 60 tablet 1   Current Facility-Administered Medications  Medication Dose Route Frequency Provider Last Rate Last Dose  . cetirizine (ZYRTEC) tablet 10 mg  10 mg Oral Daily Flora Lipps, MD          .  PHYSICAL EXAMINATION: ECOG PERFORMANCE STATUS: 1 - Symptomatic but completely ambulatory  Vitals:   07/15/16 1450  BP: (!) 170/67  Pulse: 73  Temp: 97.7 F (36.5 C)   Filed Weights   07/15/16 1450  Weight: 130 lb 2 oz (59 kg)    GENERAL: Well-nourished well-developed; Alert, no distress and comfortable.  Alone.  Looked much younger for her stated age. EYES: no pallor or icterus OROPHARYNX: no thrush or ulceration; good dentition  NECK: supple, no masses felt LYMPH:  no palpable lymphadenopathy in the cervical, axillary or inguinal regions LUNGS: clear to auscultation and  No wheeze or crackles HEART/CVS: regular rate & rhythm and no murmurs; No lower extremity edema ABDOMEN: abdomen soft, non-tender and normal bowel  sounds Musculoskeletal:no cyanosis of digits and no clubbing  PSYCH: alert & oriented x 3 with fluent speech NEURO: no focal motor/sensory deficits SKIN:  no rashes or significant lesions    LABORATORY DATA:  I have reviewed the data as listed Lab Results  Component Value Date   WBC 7.0 10/02/2015   HGB 12.7 10/02/2015   HCT 36.3 10/02/2015   MCV 88.7 10/02/2015   PLT 154.0 10/02/2015    Recent Labs  09/01/15 1149 10/02/15 1015 11/01/15 0823 05/14/16 1507 07/01/16 1626  NA 135 140 140 139 142  K 4.0 4.3 4.1 4.5 4.4  CL 102 105 106 107 107  CO2 28 28 28 29 29   GLUCOSE 100* 93 105* 106* 92  BUN 24* 17 24* 23 20  CREATININE 0.90 0.73 0.86 1.02 0.82  CALCIUM 9.1 9.2 9.2 9.4 9.6  GFRNONAA 56*  --   --   --   --   GFRAA >60  --   --   --   --   PROT 6.8 6.5 6.3  --   --   ALBUMIN 4.0 3.6 3.7  --   --   AST 22 24 17   --   --   ALT 13* 15 9  --   --   ALKPHOS 91 80 75  --   --   BILITOT 0.6 0.7 0.5  --   --   BILIDIR  --  0.2  --   --   --     RADIOGRAPHIC STUDIES: I have personally reviewed the radiological images as listed and agreed with the findings in the report. Dg Thoracic Spine 2 View  Result Date: 07/01/2016 CLINICAL DATA:  Back pain EXAM: THORACIC SPINE 2 VIEWS COMPARISON:  8/31/ 17 FINDINGS: Three views of thoracic spine submitted. There is diffuse osteopenia. No acute fracture or subluxation. Stable vertebral plana T9 vertebral body. Stable compression fracture of T12 vertebral body. Lower thoracic kyphosis again noted. IMPRESSION: There is diffuse osteopenia. No acute fracture or subluxation. Stable vertebral plana T9 vertebral body. Stable compression fracture of T12 vertebral body. Lower thoracic kyphosis again noted. Electronically Signed   By: Lahoma Crocker M.D.   On: 07/01/2016 16:41   Ct Chest Wo Contrast  Result Date: 07/02/2016 CLINICAL DATA:  Bilateral pulmonary nodules. EXAM: CT CHEST WITHOUT CONTRAST TECHNIQUE: Multidetector CT imaging of the chest was  performed following the standard protocol without IV contrast. COMPARISON:  CT 02/22/2016, PET-CT 09/07/2015, CT 08/21/2015 FINDINGS: Cardiovascular: No significant vascular findings. Normal heart size. No pericardial effusion. Mediastinum/Nodes: No axillary supraclavicular adenopathy. No mediastinal hilar adenopathy. No pericardial fluid. Lungs/Pleura: Bilateral pulmonary nodules again demonstrated. For example 12 mm in LEFT upper lobe nodule (image 28, series 3) not changed from 11 mm. RIGHT  middle lobe nodule measures 10 mm x 8 mm not changed from 9 mm x 11 mm (image 70, series 3) LEFT lower lobe nodule measures 7 mm in (image 8, series 3 unchanged from 7 mm. No new pulmonary nodules. Upper Abdomen: Low-density lesion in the liver appears benign. Adrenal glands normal. Musculoskeletal: No aggressive osseous lesion. IMPRESSION: 1. Stable bilateral pulmonary nodules compared to PET-CT and CTs dating back to 08/21/2015. Findings favor benign etiology. 2. Recommend continued surveillance with longer time interval. Electronically Signed   By: Suzy Bouchard M.D.   On: 07/02/2016 17:06    ASSESSMENT & PLAN:   Lung nodules Bilateral lung nodules- question etiology. PET avid. Suspicion of malignancy; not proven [no Biopsy done]. CT scan January 2018 shows bilateral approximately 1 cm lung nodules.   # Patient's bilateral lung nodules have shown stability over 1 year. The possible etiology- carcinoid versus low-grade malignancy; versus benign causes was again discussed with the patient at length.   # Scan has been ordered through pulmonary in 1 year [Dr.Kasa] patient will follow-up with me after the scan. No labs.  # I reviewed the blood work- with the patient in detail; also reviewed the imaging independently [as summarized above]; and with the patient in detail.    # 25 minutes face-to-face with the patient discussing the above plan of care; more than 50% of time spent on prognosis/ natural history;  counseling and coordination.    Cammie Sickle, MD 07/15/2016 3:30 PM

## 2016-07-16 DIAGNOSIS — R2681 Unsteadiness on feet: Secondary | ICD-10-CM | POA: Diagnosis not present

## 2016-07-17 ENCOUNTER — Other Ambulatory Visit: Payer: Self-pay | Admitting: Internal Medicine

## 2016-07-18 DIAGNOSIS — R2681 Unsteadiness on feet: Secondary | ICD-10-CM | POA: Diagnosis not present

## 2016-07-19 DIAGNOSIS — R2681 Unsteadiness on feet: Secondary | ICD-10-CM | POA: Diagnosis not present

## 2016-07-24 DIAGNOSIS — R2681 Unsteadiness on feet: Secondary | ICD-10-CM | POA: Diagnosis not present

## 2016-07-26 DIAGNOSIS — R2681 Unsteadiness on feet: Secondary | ICD-10-CM | POA: Diagnosis not present

## 2016-07-29 DIAGNOSIS — R2681 Unsteadiness on feet: Secondary | ICD-10-CM | POA: Diagnosis not present

## 2016-07-30 DIAGNOSIS — R2681 Unsteadiness on feet: Secondary | ICD-10-CM | POA: Diagnosis not present

## 2016-08-01 DIAGNOSIS — R2681 Unsteadiness on feet: Secondary | ICD-10-CM | POA: Diagnosis not present

## 2016-08-05 DIAGNOSIS — R2681 Unsteadiness on feet: Secondary | ICD-10-CM | POA: Diagnosis not present

## 2016-08-07 DIAGNOSIS — L578 Other skin changes due to chronic exposure to nonionizing radiation: Secondary | ICD-10-CM | POA: Diagnosis not present

## 2016-08-07 DIAGNOSIS — L821 Other seborrheic keratosis: Secondary | ICD-10-CM | POA: Diagnosis not present

## 2016-08-07 DIAGNOSIS — L812 Freckles: Secondary | ICD-10-CM | POA: Diagnosis not present

## 2016-08-07 DIAGNOSIS — L570 Actinic keratosis: Secondary | ICD-10-CM | POA: Diagnosis not present

## 2016-08-08 DIAGNOSIS — R2681 Unsteadiness on feet: Secondary | ICD-10-CM | POA: Diagnosis not present

## 2016-08-09 DIAGNOSIS — R2681 Unsteadiness on feet: Secondary | ICD-10-CM | POA: Diagnosis not present

## 2016-09-09 ENCOUNTER — Ambulatory Visit (INDEPENDENT_AMBULATORY_CARE_PROVIDER_SITE_OTHER): Payer: Medicare Other | Admitting: Podiatry

## 2016-09-09 ENCOUNTER — Encounter: Payer: Self-pay | Admitting: Podiatry

## 2016-09-09 ENCOUNTER — Ambulatory Visit (INDEPENDENT_AMBULATORY_CARE_PROVIDER_SITE_OTHER): Payer: Medicare Other | Admitting: Internal Medicine

## 2016-09-09 ENCOUNTER — Encounter: Payer: Self-pay | Admitting: Internal Medicine

## 2016-09-09 DIAGNOSIS — Z8781 Personal history of (healed) traumatic fracture: Secondary | ICD-10-CM | POA: Diagnosis not present

## 2016-09-09 DIAGNOSIS — I10 Essential (primary) hypertension: Secondary | ICD-10-CM | POA: Diagnosis not present

## 2016-09-09 DIAGNOSIS — R918 Other nonspecific abnormal finding of lung field: Secondary | ICD-10-CM

## 2016-09-09 DIAGNOSIS — R2681 Unsteadiness on feet: Secondary | ICD-10-CM

## 2016-09-09 DIAGNOSIS — L851 Acquired keratosis [keratoderma] palmaris et plantaris: Secondary | ICD-10-CM

## 2016-09-09 DIAGNOSIS — R0989 Other specified symptoms and signs involving the circulatory and respiratory systems: Secondary | ICD-10-CM

## 2016-09-09 DIAGNOSIS — F439 Reaction to severe stress, unspecified: Secondary | ICD-10-CM

## 2016-09-09 DIAGNOSIS — R6889 Other general symptoms and signs: Secondary | ICD-10-CM | POA: Diagnosis not present

## 2016-09-09 NOTE — Progress Notes (Signed)
Patient ID: Lisa Blackwell, female   DOB: 1925-11-29, 83 y.o.   MRN: [id number]   Subjective:    Patient ID: Lisa Blackwell, female    DOB: May 13, 1926, 83 y.o.   MRN: [id number]  HPI  Patient here for a scheduled follow up.  Being followed by Dr Rogue Bussing and Dr Mortimer Fries for lung nodules.  Last seen 07/15/16.  Have been stable over one year.  Recommended f/u scan in one year.  She also saw Dr Mortimer Fries 07/04/16.  Allergies.  Started on zyrtec and flonase.  Overall feels breathing is stable.  Eating.  No abdominal pain or cramping.  Bowels stable.  Previous xray - stable compression fracture.  Back is stable.  Does not want to do anything more at this time.  PT did help her balance.      Past Medical History:  Diagnosis Date  . Compression fracture    Spine  . Heart murmur   . History of chicken pox   . Hx of colonic polyp   . Hypertension   . Internal hemorrhoid    Past Surgical History:  Procedure Laterality Date  . APPENDECTOMY  1957  . HEMORRHOID BANDING  March 2015   interal rubber band ligation   Family History  Problem Relation Age of Onset  . Breast cancer Mother     40's  . Arthritis Mother   . Colon cancer Father   . Stroke Father   . Heart disease Maternal Grandmother   . Heart disease Paternal Grandmother   . Breast cancer Maternal Aunt     70's   Social History   Social History  . Marital status: Married    Spouse name: N/A  . Number of children: 1  . Years of education: N/A   Social History Main Topics  . Smoking status: Never Smoker  . Smokeless tobacco: Never Used  . Alcohol use No  . Drug use: No  . Sexual activity: Not Asked   Other Topics Concern  . None   Social History Narrative  . None    Outpatient Encounter Prescriptions as of 09/09/2016  Medication Sig  . aspirin EC 81 MG tablet Take 81 mg by mouth daily.  . calcium citrate-vitamin D (CITRACAL+D) 315-200 MG-UNIT per tablet Take 1 tablet by mouth daily.  . fluticasone (FLONASE) 50  MCG/ACT nasal spray Place 1 spray into both nostrils daily.  . magnesium oxide (MAG-OX) 400 MG tablet Take 1 tablet (400 mg total) by mouth daily. (Patient taking differently: Take 400 mg by mouth daily as needed. )  . raloxifene (EVISTA) 60 MG tablet take 1 tablet by mouth once daily  . telmisartan (MICARDIS) 20 MG tablet take 1 tablet by mouth once daily  . valACYclovir (VALTREX) 500 MG tablet take 1 tablet by mouth twice a day if needed   Facility-Administered Encounter Medications as of 09/09/2016  Medication  . cetirizine (ZYRTEC) tablet 10 mg    Review of Systems  Constitutional: Negative for appetite change and unexpected weight change.  HENT: Positive for congestion and postnasal drip.   Respiratory: Negative for chest tightness and shortness of breath.   Cardiovascular: Negative for chest pain, palpitations and leg swelling.  Gastrointestinal: Negative for abdominal pain, diarrhea, nausea and vomiting.  Genitourinary: Negative for difficulty urinating and dysuria.  Musculoskeletal: Positive for back pain. Negative for joint swelling.  Skin: Negative for color change and rash.  Neurological: Negative for dizziness, light-headedness and headaches.  Psychiatric/Behavioral: Negative for agitation and dysphoric  mood.       Objective:    Physical Exam  Constitutional: She appears well-developed and well-nourished. No distress.  HENT:  Nose: Nose normal.  Mouth/Throat: Oropharynx is clear and moist.  Neck: Neck supple. No thyromegaly present.  Cardiovascular: Normal rate and regular rhythm.   Pulmonary/Chest: Breath sounds normal. No respiratory distress. She has no wheezes.  Abdominal: Soft. Bowel sounds are normal. There is no tenderness.  Musculoskeletal: She exhibits no edema or tenderness.  Lymphadenopathy:    She has no cervical adenopathy.  Skin: No rash noted. No erythema.  Psychiatric: She has a normal mood and affect. Her behavior is normal.    BP (!) 148/72 (BP  Location: Left Arm, Patient Position: Sitting, Cuff Size: Normal)   Pulse 65   Temp 97.9 F (36.6 C) (Oral)   Resp 16   Ht 5\' 2"  (1.575 m)   Wt 129 lb (58.5 kg)   SpO2 97%   BMI 23.59 kg/m  Wt Readings from Last 3 Encounters:  09/09/16 129 lb (58.5 kg)  07/15/16 130 lb 2 oz (59 kg)  07/04/16 130 lb (59 kg)     Lab Results  Component Value Date   WBC 7.0 10/02/2015   HGB 12.7 10/02/2015   HCT 36.3 10/02/2015   PLT 154.0 10/02/2015   GLUCOSE 92 07/01/2016   CHOL 179 11/01/2015   TRIG 94.0 11/01/2015   HDL 69.10 11/01/2015   LDLCALC 91 11/01/2015   ALT 9 11/01/2015   AST 17 11/01/2015   NA 142 07/01/2016   K 4.4 07/01/2016   CL 107 07/01/2016   CREATININE 0.82 07/01/2016   BUN 20 07/01/2016   CO2 29 07/01/2016   TSH 1.12 08/03/2015    Ct Chest Wo Contrast  Result Date: 07/02/2016 CLINICAL DATA:  Bilateral pulmonary nodules. EXAM: CT CHEST WITHOUT CONTRAST TECHNIQUE: Multidetector CT imaging of the chest was performed following the standard protocol without IV contrast. COMPARISON:  CT 02/22/2016, PET-CT 09/07/2015, CT 08/21/2015 FINDINGS: Cardiovascular: No significant vascular findings. Normal heart size. No pericardial effusion. Mediastinum/Nodes: No axillary supraclavicular adenopathy. No mediastinal hilar adenopathy. No pericardial fluid. Lungs/Pleura: Bilateral pulmonary nodules again demonstrated. For example 12 mm in LEFT upper lobe nodule (image 28, series 3) not changed from 11 mm. RIGHT middle lobe nodule measures 10 mm x 8 mm not changed from 9 mm x 11 mm (image 70, series 3) LEFT lower lobe nodule measures 7 mm in (image 8, series 3 unchanged from 7 mm. No new pulmonary nodules. Upper Abdomen: Low-density lesion in the liver appears benign. Adrenal glands normal. Musculoskeletal: No aggressive osseous lesion. IMPRESSION: 1. Stable bilateral pulmonary nodules compared to PET-CT and CTs dating back to 08/21/2015. Findings favor benign etiology. 2. Recommend continued  surveillance with longer time interval. Electronically Signed   By: Suzy Bouchard M.D.   On: 07/02/2016 17:06       Assessment & Plan:   Problem List Items Addressed This Visit    Essential hypertension, benign    Blood pressure on outside checks under reasonable control.  Same medication regimen.  Follow pressures.  Follow metabolic panel.  Spot check pressure.        History of compression fracture of spine    Back pain is stable.  s/p injections.  Has been followed by Dr Sharlet Salina.  Overall she feels is stable.  Desires no further intervention at this time.  Follow.        Lung nodules    Being followed by pulmonary  and oncology.  Stable ct recently as outlined.  Recommended f/u in one year.        Stress    Handling things relatively well.  Follow.  Does not feel needs anything more at this time.        Throat clearing    Has seen ENT and pulmonary.  On zyrtec and flonase.  Stable.  Follow.        Unsteady gait    Better after therapy.  Follow.            Einar Pheasant, MD

## 2016-09-09 NOTE — Progress Notes (Signed)
Pre-visit discussion using our clinic review tool. No additional management support is needed unless otherwise documented below in the visit note.  

## 2016-09-09 NOTE — Progress Notes (Signed)
She presents today chief complaint of a painful callus plantar aspect of the left heel.  Objective: Vital signs are stable she is alert and oriented 3. Pulses are palpable. Neurologic sensory is intact. Degenerative flexors are intact. She is solitary porokeratotic lesion to the plantar aspect of the left heel no open lesions or wounds are noted.  Assessment: Pain in limb secondary to porokeratosis.  Plan: Debridement of porokeratosis in total today follow up with me as needed.

## 2016-09-15 ENCOUNTER — Encounter: Payer: Self-pay | Admitting: Internal Medicine

## 2016-09-15 NOTE — Assessment & Plan Note (Signed)
Has seen ENT and pulmonary.  On zyrtec and flonase.  Stable.  Follow.

## 2016-09-15 NOTE — Assessment & Plan Note (Signed)
Back pain is stable.  s/p injections.  Has been followed by Dr Sharlet Salina.  Overall she feels is stable.  Desires no further intervention at this time.  Follow.

## 2016-09-15 NOTE — Assessment & Plan Note (Signed)
Better after therapy.  Follow.

## 2016-09-15 NOTE — Assessment & Plan Note (Signed)
Blood pressure on outside checks under reasonable control.  Same medication regimen.  Follow pressures.  Follow metabolic panel.  Spot check pressure.

## 2016-09-15 NOTE — Assessment & Plan Note (Signed)
Being followed by pulmonary and oncology.  Stable ct recently as outlined.  Recommended f/u in one year.

## 2016-09-15 NOTE — Assessment & Plan Note (Signed)
Handling things relatively well.  Follow.  Does not feel needs anything more at this time.

## 2016-09-23 ENCOUNTER — Other Ambulatory Visit: Payer: Self-pay | Admitting: Internal Medicine

## 2016-09-23 NOTE — Telephone Encounter (Signed)
Last refilled on 08/18/15, please advise for refill, thanks

## 2016-10-07 ENCOUNTER — Encounter: Payer: Self-pay | Admitting: Internal Medicine

## 2016-10-07 ENCOUNTER — Ambulatory Visit (INDEPENDENT_AMBULATORY_CARE_PROVIDER_SITE_OTHER): Payer: Medicare Other | Admitting: Internal Medicine

## 2016-10-07 VITALS — BP 128/82 | HR 77 | Wt 132.0 lb

## 2016-10-07 DIAGNOSIS — R040 Epistaxis: Secondary | ICD-10-CM

## 2016-10-07 NOTE — Progress Notes (Signed)
[facility] Consultation      Date: 10/07/2016,   MRN# [id number] Lisa Blackwell 12-Aug-1925 Code Status:  Code Status History    This patient does not have a recorded code status. Please follow your organizational policy for patients in this situation.     Hosp day:@LENGTHOFSTAYDAYS @ Referring MD: @ATDPROV @     PCP:      AdmissionWeight: 132 lb (59.9 kg)                 CurrentWeight: 132 lb (59.9 kg) Lisa Blackwell is a 83 y.o. old female seen in consultation for lung nodules at the request of Dr Nicki Reaper     CHIEF COMPLAINT:   Follow up pulmonary nodules    HISTORY OF PRESENT ILLNESS   83 yo pleasant white female seen today for follow up for abnormal CT chest with B/L Pulmonary nodules and nose bleds  No signs of infection at this time, no lower ext swelling No acute resp issues  Patient had CXR in Feb which led to CT  Chest and PET scan and repeat CT chest most recent on 10/2015 and PET scan revealed hypermetabolic activity There were 4 nodules-stable appearance after 3 month interval back on May  Currently, CT chest shows stability of Pulmonary nodules over the past 1 year She will need repeat CT chest in 6 months for assessment  Has some post nasal drip and constant throat clearing No signs of infection at this time She has frequent nose bleeds when she wakes up, otherwise no other complaints   Current Medication:  Current Outpatient Prescriptions:  .  aspirin EC 81 MG tablet, Take 81 mg by mouth daily., Disp: , Rfl:  .  calcium citrate-vitamin D (CITRACAL+D) 315-200 MG-UNIT per tablet, Take 1 tablet by mouth daily., Disp: , Rfl:  .  cetirizine (ZYRTEC) 10 MG tablet, Take 10 mg by mouth daily., Disp: , Rfl:  .  fluticasone (FLONASE) 50 MCG/ACT nasal spray, Place 1 spray into both nostrils daily., Disp: 1 g, Rfl: 2 .  magnesium oxide (MAG-OX) 400 MG tablet, Take 1 tablet (400 mg total) by mouth daily. (Patient taking differently: Take  400 mg by mouth daily as needed. ), Disp: 30 tablet, Rfl: 5 .  raloxifene (EVISTA) 60 MG tablet, take 1 tablet by mouth once daily, Disp: 30 tablet, Rfl: 11 .  telmisartan (MICARDIS) 20 MG tablet, take 1 tablet by mouth once daily, Disp: 30 tablet, Rfl: 2 .  valACYclovir (VALTREX) 500 MG tablet, take 1 tablet by mouth twice a day if needed, Disp: 60 tablet, Rfl: 1  Current Facility-Administered Medications:  .  cetirizine (ZYRTEC) tablet 10 mg, 10 mg, Oral, Daily, Flora Lipps, MD    ALLERGIES   Patient has no known allergies.     REVIEW OF SYSTEMS   Review of Systems  Constitutional: Negative for chills, diaphoresis, fever, malaise/fatigue and weight loss.  HENT: Negative for congestion and hearing loss.   Respiratory: Negative for cough, hemoptysis, sputum production, shortness of breath and wheezing.   Cardiovascular: Negative for chest pain, palpitations, orthopnea and leg swelling.  Gastrointestinal: Negative for heartburn, nausea and vomiting.  Skin: Negative for rash.  Neurological: Negative for weakness and headaches.  All other systems reviewed and are negative.    VS: BP 128/82 (BP Location: Left Arm, Cuff Size: Normal)   Pulse 77   Wt 132 lb (59.9 kg)   SpO2 95%   BMI 24.14 kg/m      PHYSICAL  EXAM  Physical Exam  Constitutional: She is oriented to person, place, and time. No distress.  HENT:  Mouth/Throat: No oropharyngeal exudate.  Eyes: No scleral icterus.  Cardiovascular: Normal rate, regular rhythm and normal heart sounds.   No murmur heard. Pulmonary/Chest: Effort normal and breath sounds normal. No stridor. No respiratory distress. She has no wheezes. She has no rales.  Musculoskeletal: Normal range of motion. She exhibits no edema.  Neurological: She is alert and oriented to person, place, and time. No cranial nerve deficit.  Skin: Skin is warm. She is not diaphoretic.  Psychiatric: She has a normal mood and affect.             IMAGING     Stable pulmonary nodules IMPRESSION: 1. Stable bilateral pulmonary nodules compared to PET-CT and CTs dating back to 08/21/2015. Findings favor benign etiology. 2. Recommend continued surveillance with longer time interval.   ASSESSMENT/PLAN   83 yo very pleasant white female seen for follow up for abnormal CT chest with b/l Pulomonary Lung nodules  As listed below, The RUL, RML adn LUL nodules are hypermetabolic on PET scan Now has symptoms of allergic rhinits  Nodule#1  LUL 10-12 mm stable after 1 year Nodule#2  LLL 7 mm stable after 1 year Nodule#3  RUL 10 mm stable after 1 year Nodule#4  RML 7 mm stable after 1 year    1.CT chest in 6 months 2.continue flonase 3.continue zyrtec 4.ENT referral for Epistaxis   Patient satisfied with Plan of action and management. All questions answered  Corrin Parker, M.D.  [facility]  Medical Director [place] Director [facility] Cardio-Pulmonary Department

## 2016-10-07 NOTE — Patient Instructions (Signed)
ENT referral Dr Tami Ribas

## 2016-10-08 ENCOUNTER — Other Ambulatory Visit: Payer: Self-pay | Admitting: Family Medicine

## 2016-10-09 NOTE — Telephone Encounter (Signed)
DR.Scott patient

## 2016-10-15 DIAGNOSIS — H6121 Impacted cerumen, right ear: Secondary | ICD-10-CM | POA: Diagnosis not present

## 2016-10-15 DIAGNOSIS — R040 Epistaxis: Secondary | ICD-10-CM | POA: Diagnosis not present

## 2016-10-22 ENCOUNTER — Encounter: Payer: Self-pay | Admitting: Internal Medicine

## 2016-10-22 ENCOUNTER — Ambulatory Visit (INDEPENDENT_AMBULATORY_CARE_PROVIDER_SITE_OTHER): Payer: Medicare Other | Admitting: Internal Medicine

## 2016-10-22 VITALS — BP 140/80 | HR 73 | Temp 98.3°F | Resp 14 | Ht 62.0 in | Wt 129.4 lb

## 2016-10-22 DIAGNOSIS — K625 Hemorrhage of anus and rectum: Secondary | ICD-10-CM | POA: Diagnosis not present

## 2016-10-22 DIAGNOSIS — R918 Other nonspecific abnormal finding of lung field: Secondary | ICD-10-CM | POA: Diagnosis not present

## 2016-10-22 DIAGNOSIS — I10 Essential (primary) hypertension: Secondary | ICD-10-CM

## 2016-10-22 LAB — COMPREHENSIVE METABOLIC PANEL
ALBUMIN: 3.7 g/dL (ref 3.5–5.2)
ALT: 12 U/L (ref 0–35)
AST: 20 U/L (ref 0–37)
Alkaline Phosphatase: 90 U/L (ref 39–117)
BILIRUBIN TOTAL: 0.6 mg/dL (ref 0.2–1.2)
BUN: 21 mg/dL (ref 6–23)
CALCIUM: 9.4 mg/dL (ref 8.4–10.5)
CO2: 32 meq/L (ref 19–32)
Chloride: 106 mEq/L (ref 96–112)
Creatinine, Ser: 0.87 mg/dL (ref 0.40–1.20)
GFR: 65.15 mL/min (ref >60.00)
GLUCOSE: 76 mg/dL (ref 70–99)
Potassium: 4.8 mEq/L (ref 3.5–5.1)
Sodium: 141 mEq/L (ref 135–145)
Total Protein: 6.4 g/dL (ref 6.0–8.3)

## 2016-10-22 LAB — CBC WITH DIFFERENTIAL/PLATELET
BASOS ABS: 0 10*3/uL (ref 0.0–0.1)
Basophils Relative: 0.7 % (ref 0.0–3.0)
Eosinophils Absolute: 0.1 10*3/uL (ref 0.0–0.7)
Eosinophils Relative: 1.1 % (ref 0.0–5.0)
HEMATOCRIT: 39.1 % (ref 36.0–46.0)
HEMOGLOBIN: 13.2 g/dL (ref 12.0–15.0)
LYMPHS PCT: 24.1 % (ref 12.0–46.0)
Lymphs Abs: 1.3 10*3/uL (ref 0.7–4.0)
MCHC: 33.7 g/dL (ref 30.0–36.0)
MCV: 92.1 fl (ref 78.0–100.0)
Monocytes Absolute: 0.4 10*3/uL (ref 0.1–1.0)
Monocytes Relative: 7.5 % (ref 3.0–12.0)
Neutro Abs: 3.7 10*3/uL (ref 1.4–7.7)
Neutrophils Relative %: 66.6 % (ref 43.0–77.0)
Platelets: 179 10*3/uL (ref 150.0–400.0)
RBC: 4.24 Mil/uL (ref 3.87–5.11)
RDW: 14.3 % (ref 11.5–15.5)
WBC: 5.6 10*3/uL (ref 4.0–10.5)

## 2016-10-22 MED ORDER — TELMISARTAN 40 MG PO TABS: 40.0000 mg | ORAL_TABLET | Freq: Every day | ORAL | 2 refills | Status: DC

## 2016-10-22 NOTE — Progress Notes (Signed)
Patient ID: Lisa [facility], female   DOB: 1926/04/20, 83 y.o.   MRN: [id number]   Subjective:    Patient ID: Lisa [facility], female    DOB: 06-18-1926, 83 y.o.   MRN: [id number]  HPI  Patient here for a scheduled follow up.  Has been seeing Dr Mortimer Fries for f/u pulmonary nodules.  Last CT chest 06/2016.  Stable.  Last evaluated 10/07/16.  Stable.  Recommended f/u in 6 months - chest CT.  Was also referred to ENT for persistent nose bleeds.  Saw Dr Tami Ribas.  He stopped her flonase.  States was going to give her a different spray.  Plans to call.  She did not receive.  No nose bleed recently.  Has had some rectal bleeding.  Noticed a "gush" of blood yesterday.  No bleeding today.  No abdominal pain.  Has had problems with hemorrhoids previously.  Blood pressure remains elevated.  Reviewed her outside checks.  Blood pressure averaging 782-956 systolic readings.     Past Medical History:  Diagnosis Date  . Compression fracture    Spine  . Heart murmur   . History of chicken pox   . Hx of colonic polyp   . Hypertension   . Internal hemorrhoid    Past Surgical History:  Procedure Laterality Date  . APPENDECTOMY  1957  . HEMORRHOID BANDING  March 2015   interal rubber band ligation   Family History  Problem Relation Age of Onset  . Breast cancer Mother     61's  . Arthritis Mother   . Colon cancer Father   . Stroke Father   . Heart disease Maternal Grandmother   . Heart disease Paternal Grandmother   . Breast cancer Maternal Aunt     70's   Social History   Social History  . Marital status: Married    Spouse name: N/A  . Number of children: 1  . Years of education: N/A   Social History Main Topics  . Smoking status: Never Smoker  . Smokeless tobacco: Never Used  . Alcohol use No  . Drug use: No  . Sexual activity: Not Asked   Other Topics Concern  . None   Social History Narrative  . None    Outpatient Encounter Prescriptions as of 10/22/2016  Medication Sig  .  aspirin EC 81 MG tablet Take 81 mg by mouth daily.  . calcium citrate-vitamin D (CITRACAL+D) 315-200 MG-UNIT per tablet Take 1 tablet by mouth daily.  . cetirizine (ZYRTEC) 10 MG tablet Take 10 mg by mouth daily.  . fluticasone (FLONASE) 50 MCG/ACT nasal spray Place 1 spray into both nostrils daily.  . magnesium oxide (MAG-OX) 400 MG tablet Take 1 tablet (400 mg total) by mouth daily. (Patient taking differently: Take 400 mg by mouth daily as needed. )  . raloxifene (EVISTA) 60 MG tablet take 1 tablet by mouth once daily  . valACYclovir (VALTREX) 500 MG tablet take 1 tablet by mouth twice a day if needed  . [DISCONTINUED] telmisartan (MICARDIS) 20 MG tablet take 1 tablet by mouth once daily  . hydrocortisone (ANUSOL-HC) 25 MG suppository Place 1 suppository (25 mg total) rectally 2 (two) times daily.  Marland Kitchen telmisartan (MICARDIS) 40 MG tablet Take 1 tablet (40 mg total) by mouth daily.   Facility-Administered Encounter Medications as of 10/22/2016  Medication  . cetirizine (ZYRTEC) tablet 10 mg    Review of Systems  Constitutional: Negative for appetite change and unexpected weight change.  HENT: Negative for  congestion and sinus pressure.   Respiratory: Negative for cough, chest tightness and shortness of breath.   Cardiovascular: Negative for chest pain, palpitations and leg swelling.  Gastrointestinal: Negative for abdominal pain, diarrhea, nausea and vomiting.       Rectal bleeding as outlined.    Genitourinary: Negative for difficulty urinating and dysuria.  Musculoskeletal: Negative for back pain.  Skin: Negative for color change and rash.  Neurological: Negative for dizziness, light-headedness and headaches.  Psychiatric/Behavioral: Negative for agitation and dysphoric mood.       Objective:    Physical Exam  Constitutional: She appears well-developed and well-nourished. No distress.  HENT:  Nose: Nose normal.  Mouth/Throat: Oropharynx is clear and moist.  Neck: Neck supple. No  thyromegaly present.  Cardiovascular: Normal rate and regular rhythm.   Pulmonary/Chest: Breath sounds normal. No respiratory distress. She has no wheezes.  Abdominal: Soft. Bowel sounds are normal. There is no tenderness.  Genitourinary:  Genitourinary Comments: Rectal exam - heme negative.  Brown stool.    Musculoskeletal: She exhibits no edema or tenderness.  Lymphadenopathy:    She has no cervical adenopathy.  Skin: No rash noted. No erythema.  Psychiatric: She has a normal mood and affect. Her behavior is normal.    BP 140/80 (BP Location: Left Arm, Patient Position: Sitting, Cuff Size: Normal)   Pulse 73   Temp 98.3 F (36.8 C) (Oral)   Resp 14   Ht 5\' 2"  (1.575 m)   Wt 129 lb 6.4 oz (58.7 kg)   SpO2 97%   BMI 23.67 kg/m  Wt Readings from Last 3 Encounters:  10/22/16 129 lb 6.4 oz (58.7 kg)  10/07/16 132 lb (59.9 kg)  09/09/16 129 lb (58.5 kg)     Lab Results  Component Value Date   WBC 5.6 10/22/2016   HGB 13.2 10/22/2016   HCT 39.1 10/22/2016   PLT 179.0 10/22/2016   GLUCOSE 76 10/22/2016   CHOL 179 11/01/2015   TRIG 94.0 11/01/2015   HDL 69.10 11/01/2015   LDLCALC 91 11/01/2015   ALT 12 10/22/2016   AST 20 10/22/2016   NA 141 10/22/2016   K 4.8 10/22/2016   CL 106 10/22/2016   CREATININE 0.87 10/22/2016   BUN 21 10/22/2016   CO2 32 10/22/2016   TSH 1.12 08/03/2015    Ct Chest Wo Contrast  Result Date: 07/02/2016 CLINICAL DATA:  Bilateral pulmonary nodules. EXAM: CT CHEST WITHOUT CONTRAST TECHNIQUE: Multidetector CT imaging of the chest was performed following the standard protocol without IV contrast. COMPARISON:  CT 02/22/2016, PET-CT 09/07/2015, CT 08/21/2015 FINDINGS: Cardiovascular: No significant vascular findings. Normal heart size. No pericardial effusion. Mediastinum/Nodes: No axillary supraclavicular adenopathy. No mediastinal hilar adenopathy. No pericardial fluid. Lungs/Pleura: Bilateral pulmonary nodules again demonstrated. For example 12 mm  in LEFT upper lobe nodule (image 28, series 3) not changed from 11 mm. RIGHT middle lobe nodule measures 10 mm x 8 mm not changed from 9 mm x 11 mm (image 70, series 3) LEFT lower lobe nodule measures 7 mm in (image 8, series 3 unchanged from 7 mm. No new pulmonary nodules. Upper Abdomen: Low-density lesion in the liver appears benign. Adrenal glands normal. Musculoskeletal: No aggressive osseous lesion. IMPRESSION: 1. Stable bilateral pulmonary nodules compared to PET-CT and CTs dating back to 08/21/2015. Findings favor benign etiology. 2. Recommend continued surveillance with longer time interval. Electronically Signed   By: Suzy Bouchard M.D.   On: 07/02/2016 17:06       Assessment & Plan:  Problem List Items Addressed This Visit    Essential hypertension, benign    Blood pressure remains elevated.  Increase micardis to 40mg  q day.  Follow.        Relevant Medications   telmisartan (MICARDIS) 40 MG tablet   Lung nodules    Has been followed by pulmonary and oncology.  Last CT stable 06/2016.  Recommended f/u CT.        Rectal bleeding - Primary    Previously saw Dr Candace Cruise.  Treated for hemorrhoids.  Recent rectal bleeding.  Treat with annusol hc suppositories.  Refer to GI.  Check cbc.        Relevant Orders   CBC with Differential/Platelet (Completed)   Comprehensive metabolic panel (Completed)   Ambulatory referral to Gastroenterology       Einar Pheasant, MD

## 2016-10-22 NOTE — Progress Notes (Signed)
Pre-visit discussion using our clinic review tool. No additional management support is needed unless otherwise documented below in the visit note.  

## 2016-10-23 ENCOUNTER — Encounter: Payer: Self-pay | Admitting: Internal Medicine

## 2016-10-27 ENCOUNTER — Encounter: Payer: Self-pay | Admitting: Internal Medicine

## 2016-10-27 MED ORDER — HYDROCORTISONE ACETATE 25 MG RE SUPP: 25.0000 mg | Freq: Two times a day (BID) | RECTAL | 0 refills | Status: DC

## 2016-10-27 NOTE — Assessment & Plan Note (Signed)
Previously saw Dr Candace Cruise.  Treated for hemorrhoids.  Recent rectal bleeding.  Treat with annusol hc suppositories.  Refer to GI.  Check cbc.

## 2016-10-27 NOTE — Assessment & Plan Note (Signed)
Has been followed by pulmonary and oncology.  Last CT stable 06/2016.  Recommended f/u CT.

## 2016-10-27 NOTE — Assessment & Plan Note (Signed)
Blood pressure remains elevated.  Increase micardis to 40mg  q day.  Follow.

## 2016-10-30 DIAGNOSIS — K625 Hemorrhage of anus and rectum: Secondary | ICD-10-CM | POA: Diagnosis not present

## 2016-11-21 ENCOUNTER — Other Ambulatory Visit: Payer: Self-pay | Admitting: Internal Medicine

## 2016-11-21 DIAGNOSIS — Z1231 Encounter for screening mammogram for malignant neoplasm of breast: Secondary | ICD-10-CM

## 2016-12-04 ENCOUNTER — Ambulatory Visit: Payer: Medicare Other | Admitting: Internal Medicine

## 2016-12-05 DIAGNOSIS — I10 Essential (primary) hypertension: Secondary | ICD-10-CM | POA: Diagnosis not present

## 2016-12-05 DIAGNOSIS — I351 Nonrheumatic aortic (valve) insufficiency: Secondary | ICD-10-CM | POA: Diagnosis not present

## 2016-12-05 DIAGNOSIS — I358 Other nonrheumatic aortic valve disorders: Secondary | ICD-10-CM | POA: Diagnosis not present

## 2016-12-05 DIAGNOSIS — I428 Other cardiomyopathies: Secondary | ICD-10-CM | POA: Diagnosis not present

## 2016-12-10 ENCOUNTER — Ambulatory Visit
Admission: RE | Admit: 2016-12-10 | Discharge: 2016-12-10 | Disposition: A | Discharge status: Home / Self Care | Payer: Medicare Other | Source: Ambulatory Visit | Attending: Internal Medicine | Admitting: Internal Medicine

## 2016-12-10 DIAGNOSIS — Z1231 Encounter for screening mammogram for malignant neoplasm of breast: Secondary | ICD-10-CM | POA: Diagnosis not present

## 2017-01-03 ENCOUNTER — Encounter: Payer: Self-pay | Admitting: Internal Medicine

## 2017-01-03 DIAGNOSIS — M546 Pain in thoracic spine: Secondary | ICD-10-CM

## 2017-01-05 NOTE — Telephone Encounter (Signed)
Order placed for ortho referral - Dr Sharlet Salina.

## 2017-01-15 DIAGNOSIS — Z85828 Personal history of other malignant neoplasm of skin: Secondary | ICD-10-CM | POA: Diagnosis not present

## 2017-01-15 DIAGNOSIS — L578 Other skin changes due to chronic exposure to nonionizing radiation: Secondary | ICD-10-CM | POA: Diagnosis not present

## 2017-01-15 DIAGNOSIS — L570 Actinic keratosis: Secondary | ICD-10-CM | POA: Diagnosis not present

## 2017-01-26 ENCOUNTER — Other Ambulatory Visit: Payer: Self-pay | Admitting: Internal Medicine

## 2017-02-05 DIAGNOSIS — H6123 Impacted cerumen, bilateral: Secondary | ICD-10-CM | POA: Diagnosis not present

## 2017-02-05 DIAGNOSIS — H900 Conductive hearing loss, bilateral: Secondary | ICD-10-CM | POA: Diagnosis not present

## 2017-02-10 ENCOUNTER — Ambulatory Visit (INDEPENDENT_AMBULATORY_CARE_PROVIDER_SITE_OTHER): Payer: Medicare Other | Admitting: Podiatry

## 2017-02-10 ENCOUNTER — Encounter: Payer: Self-pay | Admitting: Podiatry

## 2017-02-10 DIAGNOSIS — L851 Acquired keratosis [keratoderma] palmaris et plantaris: Secondary | ICD-10-CM | POA: Diagnosis not present

## 2017-02-10 NOTE — Progress Notes (Signed)
She presents today with a chief complaint of painful calluses to the plantar aspect of her left foot.  Objective: Vital signs are stable she is alert and oriented 3. Pulses are palpable. Neurologic sensorium is intact. He tendon reflexes are intact. Muscle strength is normal. Orthopedic evaluation demonstrates also is distal to the ankle for range of motion or crepitation. Porokeratosis located on the plantar aspect left foot near the heel pad. Painful on palpation do not demonstrate any signs of wart.  Assessment: Porokeratosis left heel.  Plan: Mechanical debridement of lesions today without iatrogenic lesion follow up with me on an as-needed basis.

## 2017-02-11 ENCOUNTER — Ambulatory Visit: Payer: Medicare Other | Admitting: Internal Medicine

## 2017-02-12 ENCOUNTER — Other Ambulatory Visit: Payer: Self-pay | Admitting: Internal Medicine

## 2017-02-20 DIAGNOSIS — L578 Other skin changes due to chronic exposure to nonionizing radiation: Secondary | ICD-10-CM | POA: Diagnosis not present

## 2017-02-20 DIAGNOSIS — L570 Actinic keratosis: Secondary | ICD-10-CM | POA: Diagnosis not present

## 2017-02-27 ENCOUNTER — Other Ambulatory Visit: Payer: Self-pay | Admitting: Internal Medicine

## 2017-03-08 IMAGING — CT CT ABDOMEN W/ CM
2 of 5 series · 12 of 36 positions shown, 15 images · IV contrast (APPLIED)
Comparison: No priors.

CLINICAL DATA: 89-year-old female with history of nonproductive
cough.

EXAM:
CT CHEST AND ABDOMEN WITH CONTRAST
TECHNIQUE: Multidetector CT imaging of the chest and abdomen was performed
following the standard protocol during bolus administration of
intravenous contrast.
CONTRAST:  85mL OMNIPAQUE IOHEXOL 300 MG/ML  SOLN

[Series 3: thins · axial · 0.62mm/px · z∈[-770,-425]mm · 11 of 565 slices shown, 13 images]
[im 48/565  soft-tissue]
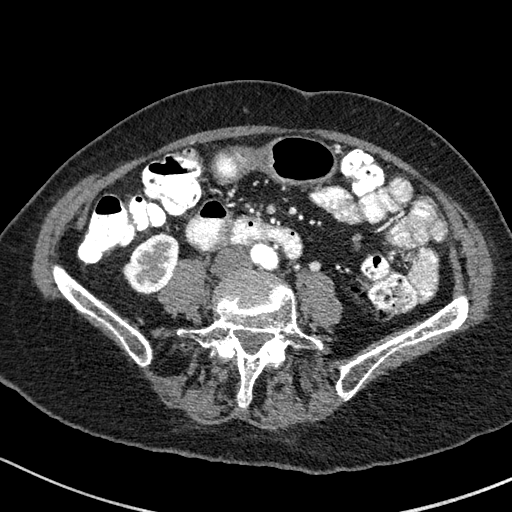
[im 48/565  bone]
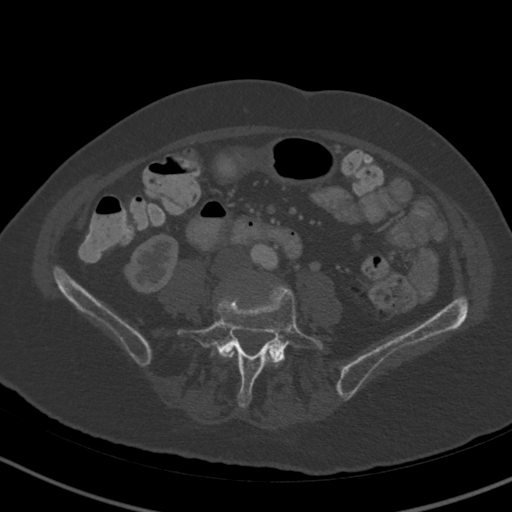
[im 118/565  soft-tissue]
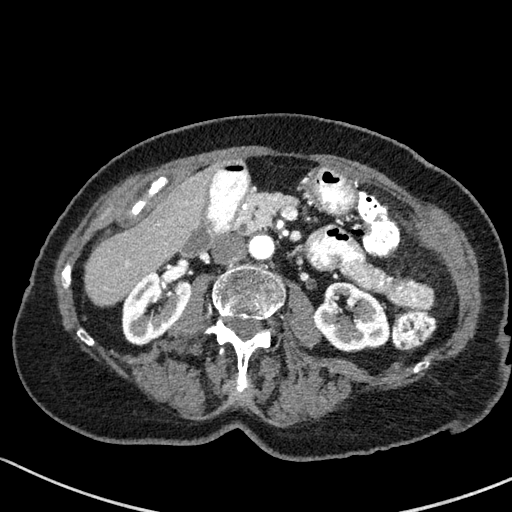
[im 189/565  soft-tissue]
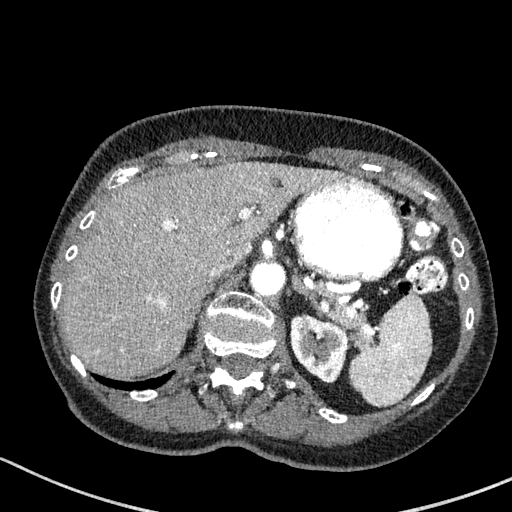
[im 259/565  soft-tissue]
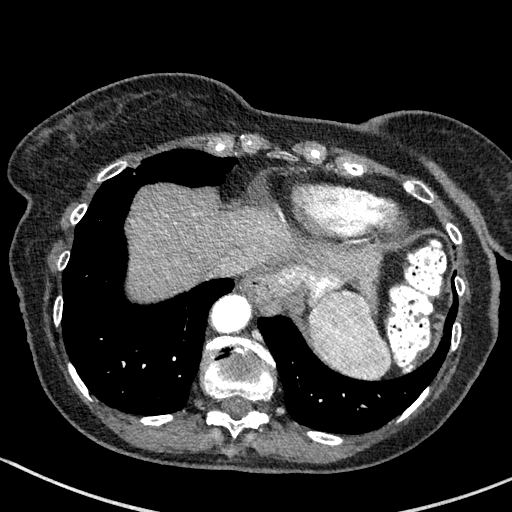
[im 306/565  soft-tissue]
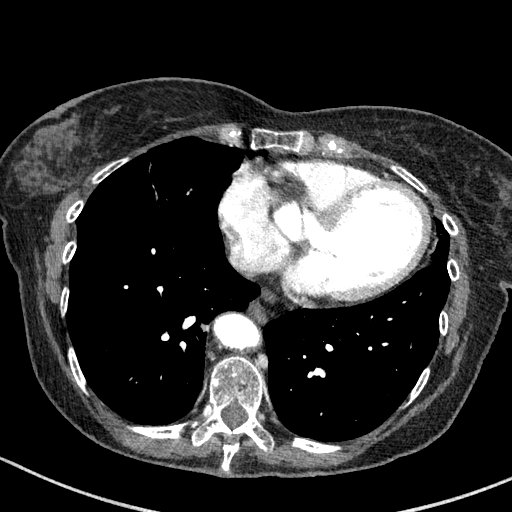
[im 377/565  soft-tissue]
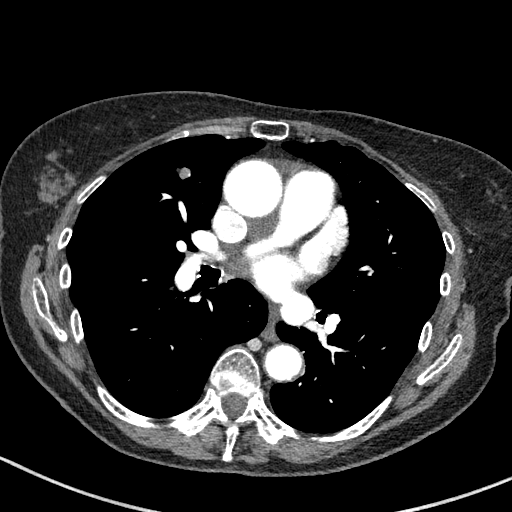
[im 447/565  soft-tissue]
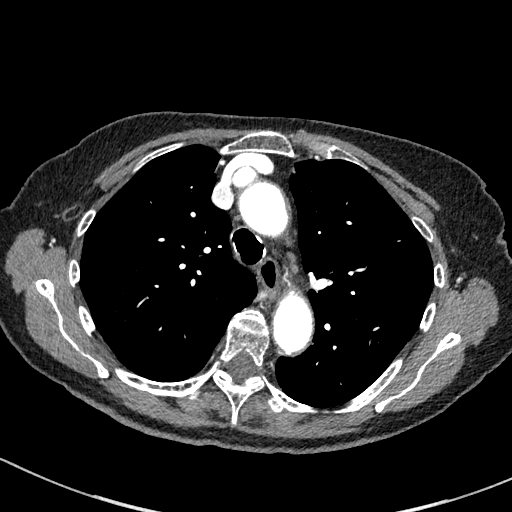
[im 471/565  lung]
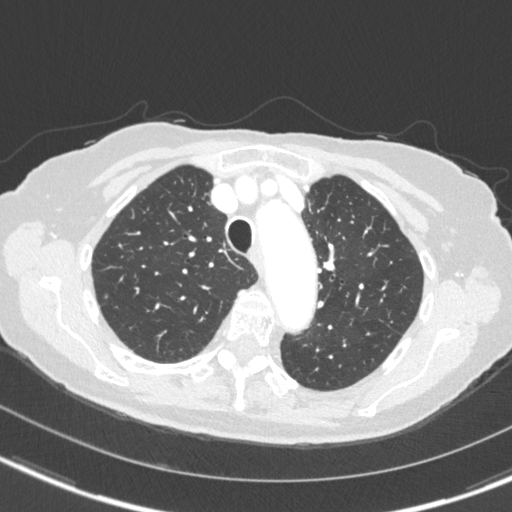
[im 494/565  lung]
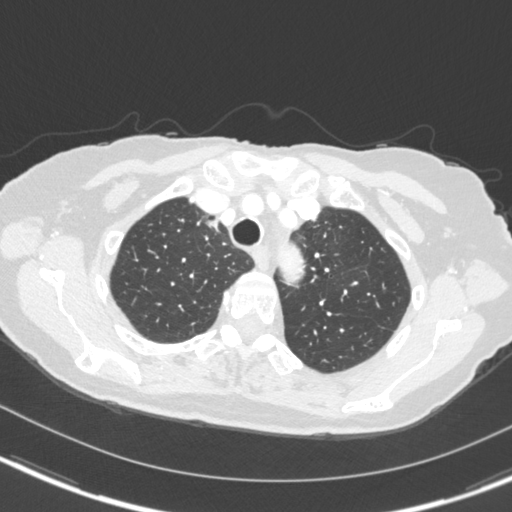
[im 518/565  soft-tissue]
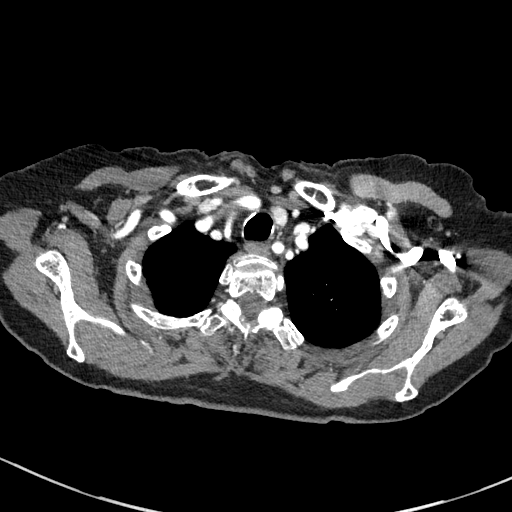
[im 518/565  lung]
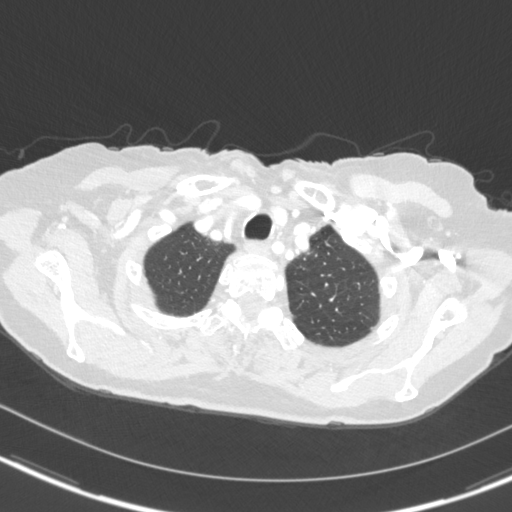
[im 541/565  lung]
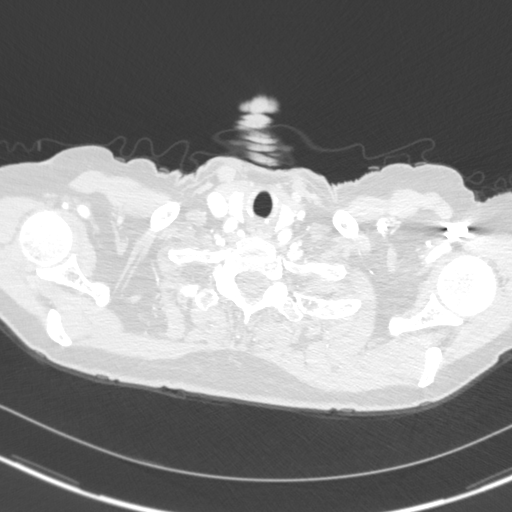

[Series 7: sagittals · sagittal · 0.52mm/px · 1 of 178 slices shown, 2 images]
[im 60/178  soft-tissue]
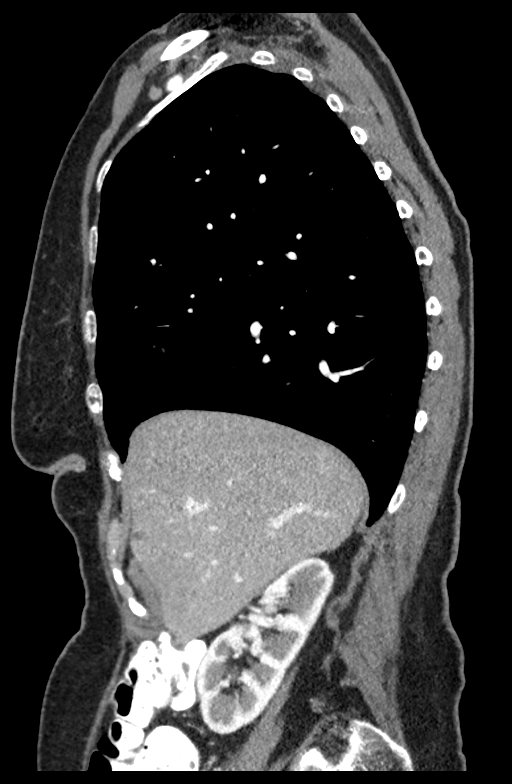
[im 60/178  bone]
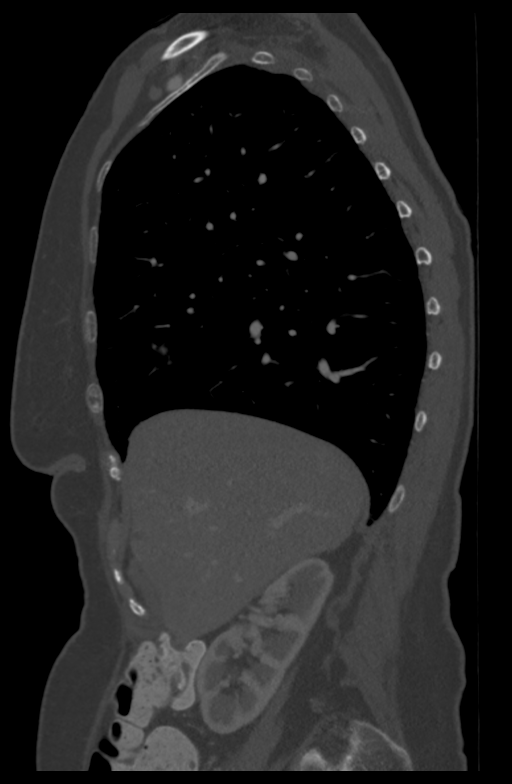

[12 of 36 positions shown; findings below may reference images not displayed]

FINDINGS: CT CHEST FINDINGS

Mediastinum/Lymph Nodes: Heart size is normal. There is no
significant pericardial fluid, thickening or pericardial
calcification. Thickening of the aortic valve. No pathologically
enlarged mediastinal are hilar lymph nodes. Please note that
accurate exclusion of hilar adenopathy is limited on noncontrast CT
scans. Small to moderate hiatal hernia. No axillary lymphadenopathy.

Lungs/Pleura: There are several pulmonary nodules scattered
throughout the lungs bilaterally. The largest of these include an 11
x 9 mm nodule in the medial aspect of the left upper lobe near the
apex (image 12 of series 4), and an 11 x 9 mm nodule in the inferior
aspect of the right upper lobe shortly above the minor fissure
(image 28 of series 4). Other nodules are generally 2-4 mm in size,
with exception of a 7 mm subpleural nodule in the inferior aspect of
the left lower lobe (image 39 of series 4). Linear area of scarring
in the lateral segment of the right middle lobe. No acute
consolidative airspace disease. No pleural effusions.

Musculoskeletal/Soft Tissues: Old compression fractures of T9 and
T12 are noted, most severe at T9 where there is nearly a vertebra
plana appearance. At T12 there is approximately 50% loss of anterior
vertebral body height. These findings appear similar to prior MRI of
the thoracic spine 04/03/2012. There are no aggressive appearing
lytic or blastic lesions noted in the visualized portions of the
skeleton.

CT ABDOMEN FINDINGS

Hepatobiliary: Several scattered sub cm low-attenuation lesions are
noted throughout the liver, too small to definitively characterize,
but they majority these are favored to represent tiny simple cysts.
There is also 1 cm low-attenuation lesions in the caudate lobe and
in segment 2 of the liver compatible with simple cysts. Some hepatic
lesions are slightly more ill-defined, the largest of which is in
the periphery of segment 8 (image 49 of series 2). No intra or
extrahepatic biliary ductal dilatation. Gallbladder is normal in
appearance.

Pancreas: No pancreatic mass. No pancreatic ductal dilatation. No
pancreatic or peripancreatic fluid or inflammatory changes.

Spleen: Unremarkable.

Adrenals/Urinary Tract: Bilateral adrenal glands and bilateral
kidneys are normal in appearance. No hydroureteronephrosis in the
visualized abdomen.

Stomach/Bowel: Normal appearance of the intra abdominal portion of
the stomach. No pathologic dilatation of visualized portions of
small bowel or colon.

Vascular/Lymphatic: Atherosclerosis in the abdominal vasculature,
without evidence of aneurysm or dissection. No lymphadenopathy noted
in the abdomen.

Other: No significant volume of ascites and no pneumoperitoneum in
the visualized peritoneal cavity.

Musculoskeletal: There are no aggressive appearing lytic or blastic
lesions noted in the visualized portions of the skeleton.
IMPRESSION: 1. Multiple pulmonary nodules in lungs bilaterally are nonspecific,
but the possibility of metastatic disease to the lungs is not
excluded.
2. In addition, there are multiple liver lesions. The majority of
these are too small to definitively characterize, but favored to
represent cysts. However, one lesion in the periphery of segment 8
of the liver is considered indeterminate (image 49 of series 2)
measuring 12 x 8 mm.
3. For further evaluation of the above findings, correlation with
PET-CT is suggested in the near future. No definite primary
malignancy is confidently identified in the chest or abdomen at this
time.
4. Thickening of the aortic valve. Echocardiographic correlation for
underlying valvular dysfunction may be warranted if clinically
appropriate.
5. Atherosclerosis.
6. Additional incidental findings, as above.

## 2017-04-07 ENCOUNTER — Ambulatory Visit (INDEPENDENT_AMBULATORY_CARE_PROVIDER_SITE_OTHER): Payer: Medicare Other | Admitting: Internal Medicine

## 2017-04-07 ENCOUNTER — Encounter: Payer: Self-pay | Admitting: Internal Medicine

## 2017-04-07 VITALS — BP 140/110 | HR 71 | Ht 62.0 in | Wt 126.0 lb

## 2017-04-07 DIAGNOSIS — Z23 Encounter for immunization: Secondary | ICD-10-CM | POA: Diagnosis not present

## 2017-04-07 DIAGNOSIS — R918 Other nonspecific abnormal finding of lung field: Secondary | ICD-10-CM | POA: Diagnosis not present

## 2017-04-07 DIAGNOSIS — J4520 Mild intermittent asthma, uncomplicated: Secondary | ICD-10-CM | POA: Diagnosis not present

## 2017-04-07 MED ORDER — ALBUTEROL SULFATE HFA 108 (90 BASE) MCG/ACT IN AERS: 2.0000 | INHALATION_SPRAY | RESPIRATORY_TRACT | 2 refills | Status: DC | PRN

## 2017-04-07 NOTE — Progress Notes (Signed)
Lisa [facility]well      Date: 04/07/2017,   MRN# [id number] Lisa [facility] 08/29/1925    AdmissionWeight: 126 lb (57.2 kg)                 CurrentWeight: 126 lb (57.2 kg) Lisa [facility] is a 83 y.o. old female seen in [facility] for lung nodules at the request of Dr Nicki Reaper     CHIEF COMPLAINT:   Follow up pulmonary nodules    HISTORY OF PRESENT ILLNESS   83 yo pleasant white female seen today for follow up for abnormal CT chest with B/L Pulmonary nodules   No signs of infection at this time, no lower ext swelling No acute resp issues  Patient had CXR in Feb which led to CT  Chest and PET scan and repeat CT chest most recent on 10/2015 and PET scan revealed hypermetabolic activity There were 4 nodules-stable appearance after 3 month interval back on May   Has some post nasal drip and constant throat clearing No signs of infection at this time She has frequent nose bleeds when she wakes up, otherwise no other complaints She has some exertional dyspnea with cough   Current Medication:  Current Outpatient Prescriptions:  .  aspirin EC 81 MG tablet, Take 81 mg by mouth daily., Disp: , Rfl:  .  calcium citrate-vitamin D (CITRACAL+D) 315-200 MG-UNIT per tablet, Take 1 tablet by mouth daily., Disp: , Rfl:  .  cetirizine (ZYRTEC) 10 MG tablet, Take 10 mg by mouth daily., Disp: , Rfl:  .  fluticasone (FLONASE) 50 MCG/ACT nasal spray, instill 1 spray into each nostril once daily, Disp: 16 g, Rfl: 2 .  magnesium oxide (MAG-OX) 400 MG tablet, Take 1 tablet (400 mg total) by mouth daily. (Patient taking differently: Take 400 mg by mouth daily as needed. ), Disp: 30 tablet, Rfl: 5 .  raloxifene (EVISTA) 60 MG tablet, take 1 tablet by mouth once daily, Disp: 30 tablet, Rfl: 11 .  telmisartan (MICARDIS) 40 MG tablet, take 1 tablet by mouth once daily, Disp: 30 tablet, Rfl: 2 .  valACYclovir (VALTREX) 500 MG tablet, take 1 tablet by mouth twice a day if  needed, Disp: 60 tablet, Rfl: 1  Current Facility-Administered Medications:  .  cetirizine (ZYRTEC) tablet 10 mg, 10 mg, Oral, Daily, Flora Lipps, MD    ALLERGIES   Patient has no known allergies.     REVIEW OF SYSTEMS   Review of Systems  Constitutional: Negative for chills, diaphoresis, fever, malaise/fatigue and weight loss.  HENT: Negative for congestion and hearing loss.   Respiratory: Negative for cough, hemoptysis, sputum production, shortness of breath and wheezing.   Cardiovascular: Negative for chest pain, palpitations, orthopnea and leg swelling.  Gastrointestinal: Negative for heartburn, nausea and vomiting.  Skin: Negative for rash.  Neurological: Negative for weakness and headaches.  All other systems reviewed and are negative.    VS: BP (!) 140/110 (BP Location: Left Arm, Cuff Size: Normal)   Pulse 71   Ht 5\' 2"  (1.575 m)   Wt 126 lb (57.2 kg)   SpO2 98%   BMI 23.05 kg/m      PHYSICAL EXAM  Physical Exam  Constitutional: She is oriented to person, place, and time. No distress.  HENT:  Mouth/Throat: No oropharyngeal exudate.  Eyes: No scleral icterus.  Cardiovascular: Normal rate, regular rhythm and normal heart sounds.   No murmur heard. Pulmonary/Chest: Effort normal and breath sounds normal. No stridor. No respiratory  distress. She has no wheezes. She has no rales.  Musculoskeletal: Normal range of motion. She exhibits no edema.  Neurological: She is alert and oriented to person, place, and time. No cranial nerve deficit.  Skin: Skin is warm. She is not diaphoretic.  Psychiatric: She has a normal mood and affect.             IMAGING    Stable pulmonary nodules IMPRESSION: 1. Stable bilateral pulmonary nodules compared to PET-CT and CTs dating back to 08/21/2015. Findings favor benign etiology. 2. Recommend continued surveillance with longer time interval.   ASSESSMENT/PLAN   83 yo very pleasant white female seen for follow up  for abnormal CT chest with b/l Pulomonary Lung nodules  As listed below, The RUL, RML adn LUL nodules are hypermetabolic on PET scan Now has symptoms of allergic rhinits and mild reactive airways disease with excertion  Nodule#1  LUL 10-12 mm stable after 1 year Nodule#2  LLL 7 mm stable after 1 year Nodule#3  RUL 10 mm stable after 1 year Nodule#4  RML 7 mm stable after 1 year    1.CT chest without contrast now to assess lung nodules 2.continue flonase 3.continue zyrtec 4.start albuterol as needed for mild reactive airways disease   Patient satisfied with Plan of action and management. All questions answered Follow up in 4-6 months Will discuss CT scan results when completed  Corrin Parker, M.D.  [facility]  Medical Director [place] Director [facility] Cardio-Pulmonary Department

## 2017-04-07 NOTE — Patient Instructions (Addendum)
Use albuterol as needed Ct chest in next 2 weeks

## 2017-04-09 DIAGNOSIS — G8929 Other chronic pain: Secondary | ICD-10-CM | POA: Diagnosis not present

## 2017-04-09 DIAGNOSIS — M439 Deforming dorsopathy, unspecified: Secondary | ICD-10-CM | POA: Diagnosis not present

## 2017-04-09 DIAGNOSIS — M546 Pain in thoracic spine: Secondary | ICD-10-CM | POA: Diagnosis not present

## 2017-04-14 ENCOUNTER — Encounter: Payer: Self-pay | Admitting: Podiatry

## 2017-04-14 ENCOUNTER — Ambulatory Visit (INDEPENDENT_AMBULATORY_CARE_PROVIDER_SITE_OTHER): Payer: Medicare Other | Admitting: Podiatry

## 2017-04-14 DIAGNOSIS — L851 Acquired keratosis [keratoderma] palmaris et plantaris: Secondary | ICD-10-CM | POA: Diagnosis not present

## 2017-04-15 ENCOUNTER — Ambulatory Visit: Payer: Medicare Other | Admitting: Internal Medicine

## 2017-04-15 ENCOUNTER — Ambulatory Visit (INDEPENDENT_AMBULATORY_CARE_PROVIDER_SITE_OTHER): Payer: Medicare Other | Admitting: Internal Medicine

## 2017-04-15 ENCOUNTER — Encounter: Payer: Self-pay | Admitting: Internal Medicine

## 2017-04-15 VITALS — BP 120/76 | HR 77 | Temp 98.1°F | Resp 18 | Wt 127.0 lb

## 2017-04-15 DIAGNOSIS — I10 Essential (primary) hypertension: Secondary | ICD-10-CM

## 2017-04-15 DIAGNOSIS — M546 Pain in thoracic spine: Secondary | ICD-10-CM | POA: Diagnosis not present

## 2017-04-15 DIAGNOSIS — Z13220 Encounter for screening for lipoid disorders: Secondary | ICD-10-CM

## 2017-04-15 DIAGNOSIS — R918 Other nonspecific abnormal finding of lung field: Secondary | ICD-10-CM

## 2017-04-15 NOTE — Progress Notes (Signed)
Patient ID: Lisa Blackwell, female   DOB: 30-Jun-1925, 83 y.o.   MRN: [id number]   Subjective:    Patient ID: Lisa Blackwell, female    DOB: March 13, 1926, 83 y.o.   MRN: [id number]  HPI  Patient here for a scheduled follow up.  She has been having chronic mid thoracic pain which has worsened recently.  Has physical therapy.  Was seen at Dr Sharlet Salina office.  Has seen him previously and received ESI.  Planning to have ESI in December.  Asked if could be scheduled any sooner.  No chest pain.  Eating.  Breathing stable.  Followed by pulmonary for pulmonary nodules.  Just evaluated 04/07/17.  Planning for f/u CT chest.  Scheduled for next week.  No abdominal pain.  Bowels moving.  No further rectal bleeding.  Had noticed this on last visit.  Given annusol.  Saw GI.  They felt no further w/up warranted.  No further bleeding.  Overall she feels things are stable.     Past Medical History:  Diagnosis Date  . Compression fracture    Spine  . Heart murmur   . History of chicken pox   . Hx of colonic polyp   . Hypertension   . Internal hemorrhoid    Past Surgical History:  Procedure Laterality Date  . APPENDECTOMY  1957  . HEMORRHOID BANDING  March 2015   interal rubber band ligation   Family History  Problem Relation Age of Onset  . Breast cancer Mother        80's  . Arthritis Mother   . Colon cancer Father   . Stroke Father   . Heart disease Maternal Grandmother   . Heart disease Paternal Grandmother   . Breast cancer Maternal Aunt        70's   Social History   Social History  . Marital status: Married    Spouse name: N/A  . Number of children: 1  . Years of education: N/A   Social History Main Topics  . Smoking status: Never Smoker  . Smokeless tobacco: Never Used  . Alcohol use No  . Drug use: No  . Sexual activity: Not Asked   Other Topics Concern  . None   Social History Narrative  . None    Outpatient Encounter Prescriptions as of 04/15/2017  Medication Sig    . albuterol (PROVENTIL HFA;VENTOLIN HFA) 108 (90 Base) MCG/ACT inhaler Inhale 2 puffs into the lungs every 4 (four) hours as needed for wheezing or shortness of breath.  Marland Kitchen aspirin EC 81 MG tablet Take 81 mg by mouth daily.  . calcium citrate-vitamin D (CITRACAL+D) 315-200 MG-UNIT per tablet Take 1 tablet by mouth daily.  . cetirizine (ZYRTEC) 10 MG tablet Take 10 mg by mouth daily.  . fluticasone (FLONASE) 50 MCG/ACT nasal spray instill 1 spray into each nostril once daily  . magnesium oxide (MAG-OX) 400 MG tablet Take 1 tablet (400 mg total) by mouth daily. (Patient taking differently: Take 400 mg by mouth daily as needed. )  . raloxifene (EVISTA) 60 MG tablet take 1 tablet by mouth once daily  . telmisartan (MICARDIS) 40 MG tablet take 1 tablet by mouth once daily  . valACYclovir (VALTREX) 500 MG tablet take 1 tablet by mouth twice a day if needed   Facility-Administered Encounter Medications as of 04/15/2017  Medication  . cetirizine (ZYRTEC) tablet 10 mg    Review of Systems  Constitutional: Negative for appetite change and unexpected weight change.  HENT: Negative for sinus pressure and sore throat.   Respiratory: Negative for chest tightness and shortness of breath.   Cardiovascular: Negative for chest pain, palpitations and leg swelling.  Gastrointestinal: Negative for abdominal pain, diarrhea, nausea and vomiting.  Genitourinary: Negative for difficulty urinating and dysuria.  Musculoskeletal: Positive for back pain. Negative for myalgias.  Skin: Negative for color change and rash.  Neurological: Negative for dizziness, light-headedness and headaches.  Psychiatric/Behavioral: Negative for agitation and dysphoric mood.       Objective:    Physical Exam  Constitutional: She appears well-developed and well-nourished. No distress.  HENT:  Nose: Nose normal.  Mouth/Throat: Oropharynx is clear and moist.  Neck: Neck supple. No thyromegaly present.  Cardiovascular: Normal rate  and regular rhythm.   Pulmonary/Chest: Breath sounds normal. No respiratory distress. She has no wheezes.  Abdominal: Soft. Bowel sounds are normal. There is no tenderness.  Musculoskeletal: She exhibits no edema or tenderness.  Lymphadenopathy:    She has no cervical adenopathy.  Skin: No rash noted. No erythema.  Psychiatric: She has a normal mood and affect. Her behavior is normal.    BP 120/76 (BP Location: Left Arm, Patient Position: Sitting, Cuff Size: Normal)   Pulse 77   Temp 98.1 F (36.7 C) (Oral)   Resp 18   Wt 127 lb (57.6 kg)   SpO2 95%   BMI 23.23 kg/m  Wt Readings from Last 3 Encounters:  04/15/17 127 lb (57.6 kg)  04/07/17 126 lb (57.2 kg)  10/22/16 129 lb 6.4 oz (58.7 kg)     Lab Results  Component Value Date   WBC 5.6 10/22/2016   HGB 13.2 10/22/2016   HCT 39.1 10/22/2016   PLT 179.0 10/22/2016   GLUCOSE 76 10/22/2016   CHOL 179 11/01/2015   TRIG 94.0 11/01/2015   HDL 69.10 11/01/2015   LDLCALC 91 11/01/2015   ALT 12 10/22/2016   AST 20 10/22/2016   NA 141 10/22/2016   K 4.8 10/22/2016   CL 106 10/22/2016   CREATININE 0.87 10/22/2016   BUN 21 10/22/2016   CO2 32 10/22/2016   TSH 1.12 08/03/2015    Mm Digital Screening Bilateral  Result Date: 12/10/2016 CLINICAL DATA:  Screening. EXAM: DIGITAL SCREENING BILATERAL MAMMOGRAM WITH CAD COMPARISON:  Previous exam(s). ACR Breast Density Category c: The breast tissue is heterogeneously dense, which may obscure small masses. FINDINGS: There are no findings suspicious for malignancy. Images were processed with CAD. IMPRESSION: No mammographic evidence of malignancy. A result letter of this screening mammogram will be mailed directly to the patient. RECOMMENDATION: Screening mammogram in one year. (Code:SM-B-01Y) BI-RADS CATEGORY  1: Negative. Electronically Signed   By: Margarette Canada M.D.   On: 12/10/2016 15:26       Assessment & Plan:   Problem List Items Addressed This Visit    Back pain    Persistent  increased pain.  Was evaluated - Dr Sharlet Salina office.  Planning for ESI.  Will contact to see if can get earlier appt.        Essential hypertension, benign    Blood pressure under good control.  Continue same medication regimen.  Follow pressures.  Follow metabolic panel.        Relevant Orders   CBC with Differential/Platelet   Comprehensive metabolic panel   TSH   Lung nodules    Has been followed by pulmonary and oncology.  Just evaluated.  Planning for f/u CT chest next week.  Has been stable.  Other Visit Diagnoses    Screening cholesterol level    -  Primary   Relevant Orders   Lipid panel       Einar Pheasant, MD

## 2017-04-15 NOTE — Progress Notes (Signed)
She presents today with a chief complaint of painful calluses to the plantar aspect of the left foot.  Objective: Vital signs are stable she is alert a oriented 3 pulses are palpable. Neurologic sensorium is intact. Multiple porokeratosis are noted to the plantar medial aspect of the left foot. Greater than 4 in total witesions or wounds.  Assessment: Porokeratosis plantar aspect left foot.  Plan: Discussed etiology pathology conservative versus surgical therapies. Multiple porokeratosis tunnels were debrided today will follow up with her in the near future

## 2017-04-18 ENCOUNTER — Encounter: Payer: Self-pay | Admitting: Internal Medicine

## 2017-04-18 NOTE — Assessment & Plan Note (Signed)
Blood pressure under good control.  Continue same medication regimen.  Follow pressures.  Follow metabolic panel.   

## 2017-04-18 NOTE — Assessment & Plan Note (Signed)
Has been followed by pulmonary and oncology.  Just evaluated.  Planning for f/u CT chest next week.  Has been stable.

## 2017-04-18 NOTE — Assessment & Plan Note (Signed)
Persistent increased pain.  Was evaluated - Dr Sharlet Salina office.  Planning for ESI.  Will contact to see if can get earlier appt.

## 2017-04-21 ENCOUNTER — Ambulatory Visit
Admission: RE | Admit: 2017-04-21 | Discharge: 2017-04-21 | Disposition: A | Discharge status: Home / Self Care | Payer: Medicare Other | Source: Ambulatory Visit | Attending: Internal Medicine | Admitting: Internal Medicine

## 2017-04-21 DIAGNOSIS — I700 Atherosclerosis of aorta: Secondary | ICD-10-CM | POA: Diagnosis not present

## 2017-04-21 DIAGNOSIS — R918 Other nonspecific abnormal finding of lung field: Secondary | ICD-10-CM | POA: Diagnosis not present

## 2017-04-21 DIAGNOSIS — I2510 Atherosclerotic heart disease of native coronary artery without angina pectoris: Secondary | ICD-10-CM | POA: Diagnosis not present

## 2017-04-21 DIAGNOSIS — M4854XA Collapsed vertebra, not elsewhere classified, thoracic region, initial encounter for fracture: Secondary | ICD-10-CM | POA: Diagnosis not present

## 2017-04-21 DIAGNOSIS — K769 Liver disease, unspecified: Secondary | ICD-10-CM | POA: Insufficient documentation

## 2017-05-01 DIAGNOSIS — L578 Other skin changes due to chronic exposure to nonionizing radiation: Secondary | ICD-10-CM | POA: Diagnosis not present

## 2017-05-01 DIAGNOSIS — L570 Actinic keratosis: Secondary | ICD-10-CM | POA: Diagnosis not present

## 2017-05-04 ENCOUNTER — Other Ambulatory Visit: Payer: Self-pay | Admitting: Internal Medicine

## 2017-05-09 ENCOUNTER — Ambulatory Visit: Payer: Medicare Other

## 2017-05-19 DIAGNOSIS — H6123 Impacted cerumen, bilateral: Secondary | ICD-10-CM | POA: Diagnosis not present

## 2017-05-19 DIAGNOSIS — H903 Sensorineural hearing loss, bilateral: Secondary | ICD-10-CM | POA: Diagnosis not present

## 2017-05-23 ENCOUNTER — Other Ambulatory Visit (INDEPENDENT_AMBULATORY_CARE_PROVIDER_SITE_OTHER): Payer: Medicare Other

## 2017-05-23 DIAGNOSIS — I10 Essential (primary) hypertension: Secondary | ICD-10-CM | POA: Diagnosis not present

## 2017-05-23 DIAGNOSIS — Z13220 Encounter for screening for lipoid disorders: Secondary | ICD-10-CM | POA: Diagnosis not present

## 2017-05-23 LAB — CBC WITH DIFFERENTIAL/PLATELET
BASOS PCT: 0.7 % (ref 0.0–3.0)
Basophils Absolute: 0 10*3/uL (ref 0.0–0.1)
EOS PCT: 0.9 % (ref 0.0–5.0)
Eosinophils Absolute: 0.1 10*3/uL (ref 0.0–0.7)
HCT: 42.1 % (ref 36.0–46.0)
HEMOGLOBIN: 13.5 g/dL (ref 12.0–15.0)
LYMPHS ABS: 1.4 10*3/uL (ref 0.7–4.0)
Lymphocytes Relative: 24.3 % (ref 12.0–46.0)
MCHC: 32.1 g/dL (ref 30.0–36.0)
MCV: 91.1 fl (ref 78.0–100.0)
MONO ABS: 0.4 10*3/uL (ref 0.1–1.0)
Monocytes Relative: 6.7 % (ref 3.0–12.0)
Neutro Abs: 4 10*3/uL (ref 1.4–7.7)
Neutrophils Relative %: 67.4 % (ref 43.0–77.0)
Platelets: 174 10*3/uL (ref 150.0–400.0)
RBC: 4.62 Mil/uL (ref 3.87–5.11)
RDW: 14 % (ref 11.5–15.5)
WBC: 5.9 10*3/uL (ref 4.0–10.5)

## 2017-05-23 LAB — COMPREHENSIVE METABOLIC PANEL
ALT: 13 U/L (ref 0–35)
AST: 24 U/L (ref 0–37)
Albumin: 3.9 g/dL (ref 3.5–5.2)
Alkaline Phosphatase: 91 U/L (ref 39–117)
BUN: 21 mg/dL (ref 6–23)
CHLORIDE: 106 meq/L (ref 96–112)
CO2: 31 mEq/L (ref 19–32)
Calcium: 9.6 mg/dL (ref 8.4–10.5)
Creatinine, Ser: 0.96 mg/dL (ref 0.40–1.20)
GFR: 58.08 mL/min — ABNORMAL LOW (ref >60.00)
GLUCOSE: 92 mg/dL (ref 70–99)
POTASSIUM: 4.4 meq/L (ref 3.5–5.1)
SODIUM: 142 meq/L (ref 135–145)
Total Bilirubin: 0.7 mg/dL (ref 0.2–1.2)
Total Protein: 6.8 g/dL (ref 6.0–8.3)

## 2017-05-23 LAB — TSH: TSH: 2.57 u[IU]/mL (ref 0.35–4.50)

## 2017-05-23 LAB — LIPID PANEL
Cholesterol: 176 mg/dL (ref 0–200)
HDL: 67.4 mg/dL (ref >39.00)
LDL CALC: 96 mg/dL (ref 0–99)
NONHDL: 108.43
Total CHOL/HDL Ratio: 3
Triglycerides: 63 mg/dL (ref 0.0–149.0)
VLDL: 12.6 mg/dL (ref 0.0–40.0)

## 2017-05-24 ENCOUNTER — Encounter: Payer: Self-pay | Admitting: Internal Medicine

## 2017-05-27 ENCOUNTER — Encounter: Payer: Self-pay | Admitting: Internal Medicine

## 2017-05-27 ENCOUNTER — Ambulatory Visit (INDEPENDENT_AMBULATORY_CARE_PROVIDER_SITE_OTHER): Payer: Medicare Other | Admitting: Internal Medicine

## 2017-05-27 DIAGNOSIS — I10 Essential (primary) hypertension: Secondary | ICD-10-CM

## 2017-05-27 DIAGNOSIS — R634 Abnormal weight loss: Secondary | ICD-10-CM | POA: Diagnosis not present

## 2017-05-27 DIAGNOSIS — R252 Cramp and spasm: Secondary | ICD-10-CM

## 2017-05-27 MED ORDER — MAGNESIUM OXIDE 400 MG PO TABS: 400.0000 mg | ORAL_TABLET | Freq: Every day | ORAL | 1 refills | Status: AC | PRN

## 2017-05-27 NOTE — Progress Notes (Signed)
Patient ID: Lisa Blackwell, female   DOB: [birth year], 83 y.o.   MRN: [id number]   Subjective:    Patient ID: Lisa Blackwell, female    DOB: February 07, 1926, 83 y.o.   MRN: [id number]  HPI  Patient here for a scheduled follow up.  States she is doing well.  Stays active. No chest pain.  No sob.  No acid reflux.  No abdominal pain.  Bowels moving.  Brought in blood pressure readings.  Most averaging 130-140s/70-80s.     Past Medical History:  Diagnosis Date  . Compression fracture    Spine  . Heart murmur   . History of chicken pox   . Hx of colonic polyp   . Hypertension   . Internal hemorrhoid    Past Surgical History:  Procedure Laterality Date  . APPENDECTOMY  1957  . HEMORRHOID BANDING  March 2015   interal rubber band ligation   Family History  Problem Relation Age of Onset  . Breast cancer Mother        104's  . Arthritis Mother   . Colon cancer Father   . Stroke Father   . Heart disease Maternal Grandmother   . Heart disease Paternal Grandmother   . Breast cancer Maternal Aunt        70's   Social History   Socioeconomic History  . Marital status: Married    Spouse name: None  . Number of children: 1  . Years of education: None  . Highest education level: None  Social Needs  . Financial resource strain: None  . Food insecurity - worry: None  . Food insecurity - inability: None  . Transportation needs - medical: None  . Transportation needs - non-medical: None  Occupational History  . None  Tobacco Use  . Smoking status: Never Smoker  . Smokeless tobacco: Never Used  Substance and Sexual Activity  . Alcohol use: No    Alcohol/week: 0.0 oz  . Drug use: No  . Sexual activity: None  Other Topics Concern  . None  Social History Narrative  . None    Outpatient Encounter Medications as of 05/27/2017  Medication Sig  . albuterol (PROVENTIL HFA;VENTOLIN HFA) 108 (90 Base) MCG/ACT inhaler Inhale 2 puffs into the lungs every 4 (four) hours as needed for  wheezing or shortness of breath. (Patient taking differently: Inhale 2 puffs into the lungs every 4 (four) hours as needed for wheezing or shortness of breath (patient stated she hasn't started taking medication). )  . aspirin EC 81 MG tablet Take 81 mg by mouth daily.  . calcium citrate-vitamin D (CITRACAL+D) 315-200 MG-UNIT per tablet Take 1 tablet by mouth daily.  . fluticasone (FLONASE) 50 MCG/ACT nasal spray instill 1 spray into each nostril once daily  . magnesium oxide (MAG-OX) 400 MG tablet Take 1 tablet (400 mg total) by mouth daily as needed.  . raloxifene (EVISTA) 60 MG tablet take 1 tablet by mouth once daily  . telmisartan (MICARDIS) 40 MG tablet take 1 tablet by mouth once daily  . valACYclovir (VALTREX) 500 MG tablet take 1 tablet by mouth twice a day if needed  . [DISCONTINUED] magnesium oxide (MAG-OX) 400 MG tablet Take 1 tablet (400 mg total) by mouth daily. (Patient taking differently: Take 400 mg by mouth daily as needed. )  . [DISCONTINUED] cetirizine (ZYRTEC) 10 MG tablet Take 10 mg by mouth daily.   Facility-Administered Encounter Medications as of 05/27/2017  Medication  . cetirizine (ZYRTEC) tablet 10  mg    Review of Systems  Constitutional: Negative for appetite change and unexpected weight change.  HENT: Negative for congestion and sinus pressure.   Respiratory: Negative for cough, chest tightness and shortness of breath.   Cardiovascular: Negative for chest pain, palpitations and leg swelling.  Gastrointestinal: Negative for abdominal pain, diarrhea, nausea and vomiting.  Genitourinary: Negative for difficulty urinating and dysuria.  Musculoskeletal: Negative for joint swelling.       Some leg cramps at times.  Request refill on magnesium.  Helped.    Skin: Negative for color change and rash.  Neurological: Negative for dizziness, light-headedness and headaches.  Psychiatric/Behavioral: Negative for agitation and dysphoric mood.       Objective:    Physical  Exam  Constitutional: She appears well-developed and well-nourished. No distress.  HENT:  Nose: Nose normal.  Mouth/Throat: Oropharynx is clear and moist.  Neck: Neck supple. No thyromegaly present.  Cardiovascular: Normal rate and regular rhythm.  Pulmonary/Chest: Breath sounds normal. No respiratory distress. She has no wheezes.  Abdominal: Soft. Bowel sounds are normal. There is no tenderness.  Musculoskeletal: She exhibits no edema or tenderness.  Lymphadenopathy:    She has no cervical adenopathy.  Skin: No rash noted. No erythema.  Psychiatric: She has a normal mood and affect. Her behavior is normal.    BP 126/80   Pulse 81   Temp 97.8 F (36.6 C)   Resp 16   Ht 5\' 2"  (1.575 m)   Wt 125 lb (56.7 kg)   SpO2 96%   BMI 22.86 kg/m  Wt Readings from Last 3 Encounters:  05/27/17 125 lb (56.7 kg)  04/15/17 127 lb (57.6 kg)  04/07/17 126 lb (57.2 kg)     Lab Results  Component Value Date   WBC 5.9 05/23/2017   HGB 13.5 05/23/2017   HCT 42.1 05/23/2017   PLT 174.0 05/23/2017   GLUCOSE 92 05/23/2017   CHOL 176 05/23/2017   TRIG 63.0 05/23/2017   HDL 67.40 05/23/2017   LDLCALC 96 05/23/2017   ALT 13 05/23/2017   AST 24 05/23/2017   NA 142 05/23/2017   K 4.4 05/23/2017   CL 106 05/23/2017   CREATININE 0.96 05/23/2017   BUN 21 05/23/2017   CO2 31 05/23/2017   TSH 2.57 05/23/2017    Ct Chest Wo Contrast  Result Date: 04/21/2017 CLINICAL DATA:  Shortness of breath. EXAM: CT CHEST WITHOUT CONTRAST TECHNIQUE: Multidetector CT imaging of the chest was performed following the standard protocol without IV contrast. COMPARISON:  07/02/2016.  08/21/2015 FINDINGS: Cardiovascular: Heart size upper normal. Coronary artery calcification is evident. Atherosclerotic calcification is noted in the wall of the thoracic aorta. Mediastinum/Nodes: No mediastinal lymphadenopathy. No evidence for gross hilar lymphadenopathy although assessment is limited by the lack of intravenous  contrast on today's study. There is no axillary lymphadenopathy. The esophagus has normal imaging features. Lungs/Pleura: Bilateral pulmonary nodules again noted. Left upper lobe nodule measures 11 mm today compared to 12 mm previously. Right upper lobe nodule on image 68 is 8 x 10 mm today which compares to 8 x 10 mm previously. Central right middle lobe 7 mm nodule seen on image 82 is unchanged. Several other tiny bilateral pulmonary nodules are stable. The 7 mm index nodule at the left base (image 99) is unchanged. No focal airspace consolidation. No pulmonary edema or pleural effusion. Upper Abdomen: Stable hypoattenuating liver lesions, likely cysts. Musculoskeletal: Compression fractures at T12 and T9 are stable. IMPRESSION: 1. Stable bilateral pulmonary  nodules comparing back to 08/21/2015. This provides nearly 2 years of stable imaging follow-up. Consider 1 additional follow-up CT scan in 6-12 months to confirm 2 years of imaging stability. 2. Stable compression fractures at T9 and T12. 3. Stable appearance of hypoattenuating liver lesions, likely benign. 4. Coronary artery and Aortic Atherosclerois (ICD10-170.0) Electronically Signed   By: Misty Stanley M.D.   On: 04/21/2017 15:40       Assessment & Plan:   Problem List Items Addressed This Visit    Essential hypertension, benign    Blood pressure as outlined.  Continue same medication regimen.  Follow pressures.  Follow metabolic panel.        Leg cramps    Request refill on magnesium.  Feels helps.  Follow.  Not severe.        Loss of weight    Weight relatively stable.  Follow.           Einar Pheasant, MD

## 2017-05-30 ENCOUNTER — Encounter: Payer: Self-pay | Admitting: Internal Medicine

## 2017-05-30 NOTE — Assessment & Plan Note (Signed)
Request refill on magnesium.  Feels helps.  Follow.  Not severe.

## 2017-05-30 NOTE — Assessment & Plan Note (Signed)
Blood pressure as outlined.  Continue same medication regimen.  Follow pressures.  Follow metabolic panel.  

## 2017-05-30 NOTE — Assessment & Plan Note (Signed)
Weight relatively stable.  Follow.

## 2017-06-05 DIAGNOSIS — I10 Essential (primary) hypertension: Secondary | ICD-10-CM | POA: Diagnosis not present

## 2017-06-05 DIAGNOSIS — I358 Other nonrheumatic aortic valve disorders: Secondary | ICD-10-CM | POA: Diagnosis not present

## 2017-06-10 DIAGNOSIS — H2513 Age-related nuclear cataract, bilateral: Secondary | ICD-10-CM | POA: Diagnosis not present

## 2017-07-17 DIAGNOSIS — M5416 Radiculopathy, lumbar region: Secondary | ICD-10-CM | POA: Diagnosis not present

## 2017-07-17 DIAGNOSIS — M5135 Other intervertebral disc degeneration, thoracolumbar region: Secondary | ICD-10-CM | POA: Diagnosis not present

## 2017-07-28 ENCOUNTER — Other Ambulatory Visit: Payer: Self-pay | Admitting: Internal Medicine

## 2017-08-06 ENCOUNTER — Other Ambulatory Visit: Payer: Self-pay | Admitting: Internal Medicine

## 2017-08-07 DIAGNOSIS — L570 Actinic keratosis: Secondary | ICD-10-CM | POA: Diagnosis not present

## 2017-08-07 DIAGNOSIS — L578 Other skin changes due to chronic exposure to nonionizing radiation: Secondary | ICD-10-CM | POA: Diagnosis not present

## 2017-08-31 ENCOUNTER — Other Ambulatory Visit: Payer: Self-pay | Admitting: Internal Medicine

## 2017-09-05 DIAGNOSIS — M5135 Other intervertebral disc degeneration, thoracolumbar region: Secondary | ICD-10-CM | POA: Diagnosis not present

## 2017-09-05 DIAGNOSIS — M5416 Radiculopathy, lumbar region: Secondary | ICD-10-CM | POA: Diagnosis not present

## 2017-09-09 ENCOUNTER — Telehealth: Payer: Self-pay

## 2017-09-09 DIAGNOSIS — S20212A Contusion of left front wall of thorax, initial encounter: Secondary | ICD-10-CM | POA: Diagnosis not present

## 2017-09-09 DIAGNOSIS — R0781 Pleurodynia: Secondary | ICD-10-CM | POA: Diagnosis not present

## 2017-09-09 DIAGNOSIS — S2242XA Multiple fractures of ribs, left side, initial encounter for closed fracture: Secondary | ICD-10-CM | POA: Diagnosis not present

## 2017-09-09 NOTE — Telephone Encounter (Signed)
Please advise if patient should be scheduled for an appointment to get an xray or go to Urgent care.

## 2017-09-09 NOTE — Telephone Encounter (Signed)
Noted.  Agree with need for evaluation.   

## 2017-09-09 NOTE — Telephone Encounter (Signed)
Advised patient to go to urgent care so she can be evaluated from fall. Patient agreed. Will follow up.

## 2017-09-09 NOTE — Telephone Encounter (Signed)
Copied from [place] [id number]. Topic: Appointment Scheduling - Scheduling Inquiry for Clinic >> Sep 09, 2017  9:53 AM Aurelio Brash B wrote: Reason for CRM:PT slide  off bed Sunday and has had pain on left side since-  she states it hurts more when she bends down- says pain if not unbearable-  Please call pt with apt.

## 2017-09-09 NOTE — Telephone Encounter (Signed)
Pt called again,  She is in a lot of pain and wanting to have an x-ray  Please call pt.

## 2017-09-28 ENCOUNTER — Other Ambulatory Visit: Payer: Self-pay | Admitting: Internal Medicine

## 2017-09-29 ENCOUNTER — Other Ambulatory Visit: Payer: Self-pay | Admitting: Internal Medicine

## 2017-10-06 ENCOUNTER — Other Ambulatory Visit: Payer: Medicare Other

## 2017-10-06 DIAGNOSIS — H903 Sensorineural hearing loss, bilateral: Secondary | ICD-10-CM | POA: Diagnosis not present

## 2017-10-06 DIAGNOSIS — H6123 Impacted cerumen, bilateral: Secondary | ICD-10-CM | POA: Diagnosis not present

## 2017-10-07 ENCOUNTER — Ambulatory Visit (INDEPENDENT_AMBULATORY_CARE_PROVIDER_SITE_OTHER): Payer: Medicare Other | Admitting: Internal Medicine

## 2017-10-07 ENCOUNTER — Encounter: Payer: Self-pay | Admitting: Internal Medicine

## 2017-10-07 VITALS — BP 160/92 | HR 65 | Temp 98.0°F | Resp 18 | Ht 62.0 in | Wt 124.0 lb

## 2017-10-07 DIAGNOSIS — I10 Essential (primary) hypertension: Secondary | ICD-10-CM

## 2017-10-07 DIAGNOSIS — R634 Abnormal weight loss: Secondary | ICD-10-CM

## 2017-10-07 DIAGNOSIS — R918 Other nonspecific abnormal finding of lung field: Secondary | ICD-10-CM | POA: Diagnosis not present

## 2017-10-07 DIAGNOSIS — E2839 Other primary ovarian failure: Secondary | ICD-10-CM | POA: Diagnosis not present

## 2017-10-07 DIAGNOSIS — Z1231 Encounter for screening mammogram for malignant neoplasm of breast: Secondary | ICD-10-CM | POA: Diagnosis not present

## 2017-10-07 DIAGNOSIS — Z0000 Encounter for general adult medical examination without abnormal findings: Secondary | ICD-10-CM | POA: Diagnosis not present

## 2017-10-07 DIAGNOSIS — Z1239 Encounter for other screening for malignant neoplasm of breast: Secondary | ICD-10-CM

## 2017-10-07 DIAGNOSIS — M810 Age-related osteoporosis without current pathological fracture: Secondary | ICD-10-CM | POA: Diagnosis not present

## 2017-10-07 LAB — BASIC METABOLIC PANEL
BUN: 18 mg/dL (ref 6–23)
CO2: 29 mEq/L (ref 19–32)
CREATININE: 0.82 mg/dL (ref 0.40–1.20)
Calcium: 9.5 mg/dL (ref 8.4–10.5)
Chloride: 106 mEq/L (ref 96–112)
GFR: 69.61 mL/min (ref >60.00)
Glucose, Bld: 93 mg/dL (ref 70–99)
Potassium: 4.3 mEq/L (ref 3.5–5.1)
SODIUM: 140 meq/L (ref 135–145)

## 2017-10-07 LAB — HEPATIC FUNCTION PANEL
ALK PHOS: 98 U/L (ref 39–117)
ALT: 11 U/L (ref 0–35)
AST: 17 U/L (ref 0–37)
Albumin: 3.9 g/dL (ref 3.5–5.2)
BILIRUBIN DIRECT: 0.1 mg/dL (ref 0.0–0.3)
Total Bilirubin: 0.7 mg/dL (ref 0.2–1.2)
Total Protein: 6.6 g/dL (ref 6.0–8.3)

## 2017-10-07 NOTE — Progress Notes (Signed)
Patient ID: Lisa Blackwell, female   DOB: Apr 24, 1926, 84 y.o.   MRN: [id number]   Subjective:    Patient ID: Lisa Blackwell, female    DOB: Sep 13, 1925, 84 y.o.   MRN: [id number]  HPI  Patient with past history of documented lung nodules and hypertension.  She comes in today to follow up on these issues as well as for a complete physical exam. She reports she is doing relatively well.  Tries to stay active.  No chest pain.  No sob.  No acid reflux.  No abdominal pain.  Bowels moving.  Saw pulmonary.  Nodules stable.  Saw cardiology 05/2017 - f/u aortic valve.  Felt stable.  Recommended f/u in 6 months.     Past Medical History:  Diagnosis Date  . Compression fracture    Spine  . Heart murmur   . History of chicken pox   . Hx of colonic polyp   . Hypertension   . Internal hemorrhoid    Past Surgical History:  Procedure Laterality Date  . APPENDECTOMY  1957  . HEMORRHOID BANDING  March 2015   interal rubber band ligation   Family History  Problem Relation Age of Onset  . Breast cancer Mother        41's  . Arthritis Mother   . Colon cancer Father   . Stroke Father   . Heart disease Maternal Grandmother   . Heart disease Paternal Grandmother   . Breast cancer Maternal Aunt        70's   Social History   Socioeconomic History  . Marital status: Married    Spouse name: Not on file  . Number of children: 1  . Years of education: Not on file  . Highest education level: Not on file  Occupational History  . Not on file  Social Needs  . Financial resource strain: Not on file  . Food insecurity:    Worry: Not on file    Inability: Not on file  . Transportation needs:    Medical: Not on file    Non-medical: Not on file  Tobacco Use  . Smoking status: Never Smoker  . Smokeless tobacco: Never Used  Substance and Sexual Activity  . Alcohol use: No    Alcohol/week: 0.0 oz  . Drug use: No  . Sexual activity: Not on file  Lifestyle  . Physical activity:    Days per  week: Not on file    Minutes per session: Not on file  . Stress: Not on file  Relationships  . Social connections:    Talks on phone: Not on file    Gets together: Not on file    Attends religious service: Not on file    Active member of club or organization: Not on file    Attends meetings of clubs or organizations: Not on file    Relationship status: Not on file  Other Topics Concern  . Not on file  Social History Narrative  . Not on file    Outpatient Encounter Medications as of 10/07/2017  Medication Sig  . albuterol (PROVENTIL HFA;VENTOLIN HFA) 108 (90 Base) MCG/ACT inhaler Inhale 2 puffs into the lungs every 4 (four) hours as needed for wheezing or shortness of breath. (Patient taking differently: Inhale 2 puffs into the lungs every 4 (four) hours as needed for wheezing or shortness of breath (patient stated she hasn't started taking medication). )  . aspirin EC 81 MG tablet Take 81 mg by mouth  daily.  . fluticasone (FLONASE) 50 MCG/ACT nasal spray SHAKE LIQUID AND USE 1 SPRAY IN EACH NOSTRIL EVERY DAY  . magnesium oxide (MAG-OX) 400 MG tablet Take 1 tablet (400 mg total) by mouth daily as needed.  . raloxifene (EVISTA) 60 MG tablet take 1 tablet by mouth once daily  . telmisartan (MICARDIS) 40 MG tablet TAKE 1 TABLET BY MOUTH EVERY DAY  . valACYclovir (VALTREX) 500 MG tablet take 1 tablet by mouth twice a day if needed  . [DISCONTINUED] calcium citrate-vitamin D (CITRACAL+D) 315-200 MG-UNIT per tablet Take 1 tablet by mouth daily.   Facility-Administered Encounter Medications as of 10/07/2017  Medication  . cetirizine (ZYRTEC) tablet 10 mg    Review of Systems  Constitutional: Negative for appetite change and unexpected weight change.  HENT: Negative for congestion and sinus pressure.   Eyes: Negative for pain and visual disturbance.  Respiratory: Negative for cough, chest tightness and shortness of breath.   Cardiovascular: Negative for chest pain, palpitations and leg  swelling.  Gastrointestinal: Negative for abdominal pain, diarrhea, nausea and vomiting.  Genitourinary: Negative for difficulty urinating and dysuria.  Musculoskeletal: Negative for joint swelling and myalgias.  Skin: Negative for color change and rash.  Neurological: Negative for dizziness, light-headedness and headaches.  Hematological: Negative for adenopathy. Does not bruise/bleed easily.  Psychiatric/Behavioral: Negative for agitation and dysphoric mood.       Objective:     Blood pressure rechecked by me:  144/78  Physical Exam  Constitutional: She is oriented to person, place, and time. She appears well-developed and well-nourished. No distress.  HENT:  Nose: Nose normal.  Mouth/Throat: Oropharynx is clear and moist.  Eyes: Right eye exhibits no discharge. Left eye exhibits no discharge. No scleral icterus.  Neck: Neck supple. No thyromegaly present.  Cardiovascular: Normal rate and regular rhythm.  Pulmonary/Chest: Breath sounds normal. No accessory muscle usage. No tachypnea. No respiratory distress. She has no decreased breath sounds. She has no wheezes. She has no rhonchi. Right breast exhibits no inverted nipple, no mass, no nipple discharge and no tenderness (no axillary adenopathy). Left breast exhibits no inverted nipple, no mass, no nipple discharge and no tenderness (no axilarry adenopathy).  Abdominal: Soft. Bowel sounds are normal. There is no tenderness.  Musculoskeletal: She exhibits no edema or tenderness.  Lymphadenopathy:    She has no cervical adenopathy.  Neurological: She is alert and oriented to person, place, and time.  Skin: No rash noted. No erythema.  Psychiatric: She has a normal mood and affect. Her behavior is normal.    BP (!) 160/92 (BP Location: Left Arm, Patient Position: Sitting, Cuff Size: Normal)   Pulse 65   Temp 98 F (36.7 C) (Oral)   Resp 18   Ht 5\' 2"  (1.575 m)   Wt 124 lb (56.2 kg)   SpO2 93%   BMI 22.68 kg/m  Wt Readings  from Last 3 Encounters:  10/07/17 124 lb (56.2 kg)  05/27/17 125 lb (56.7 kg)  04/15/17 127 lb (57.6 kg)     Lab Results  Component Value Date   WBC 5.9 05/23/2017   HGB 13.5 05/23/2017   HCT 42.1 05/23/2017   PLT 174.0 05/23/2017   GLUCOSE 93 10/07/2017   CHOL 176 05/23/2017   TRIG 63.0 05/23/2017   HDL 67.40 05/23/2017   LDLCALC 96 05/23/2017   ALT 11 10/07/2017   AST 17 10/07/2017   NA 140 10/07/2017   K 4.3 10/07/2017   CL 106 10/07/2017   CREATININE  0.82 10/07/2017   BUN 18 10/07/2017   CO2 29 10/07/2017   TSH 2.57 05/23/2017    Ct Chest Wo Contrast  Result Date: 04/21/2017 CLINICAL DATA:  Shortness of breath. EXAM: CT CHEST WITHOUT CONTRAST TECHNIQUE: Multidetector CT imaging of the chest was performed following the standard protocol without IV contrast. COMPARISON:  07/02/2016.  08/21/2015 FINDINGS: Cardiovascular: Heart size upper normal. Coronary artery calcification is evident. Atherosclerotic calcification is noted in the wall of the thoracic aorta. Mediastinum/Nodes: No mediastinal lymphadenopathy. No evidence for gross hilar lymphadenopathy although assessment is limited by the lack of intravenous contrast on today's study. There is no axillary lymphadenopathy. The esophagus has normal imaging features. Lungs/Pleura: Bilateral pulmonary nodules again noted. Left upper lobe nodule measures 11 mm today compared to 12 mm previously. Right upper lobe nodule on image 68 is 8 x 10 mm today which compares to 8 x 10 mm previously. Central right middle lobe 7 mm nodule seen on image 82 is unchanged. Several other tiny bilateral pulmonary nodules are stable. The 7 mm index nodule at the left base (image 99) is unchanged. No focal airspace consolidation. No pulmonary edema or pleural effusion. Upper Abdomen: Stable hypoattenuating liver lesions, likely cysts. Musculoskeletal: Compression fractures at T12 and T9 are stable. IMPRESSION: 1. Stable bilateral pulmonary nodules comparing  back to 08/21/2015. This provides nearly 2 years of stable imaging follow-up. Consider 1 additional follow-up CT scan in 6-12 months to confirm 2 years of imaging stability. 2. Stable compression fractures at T9 and T12. 3. Stable appearance of hypoattenuating liver lesions, likely benign. 4. Coronary artery and Aortic Atherosclerois (ICD10-170.0) Electronically Signed   By: Misty Stanley M.D.   On: 04/21/2017 15:40       Assessment & Plan:   Problem List Items Addressed This Visit    Essential hypertension, benign - Primary    Blood pressure on recheck improved.  Continue same medication regimen.  Follow pressures.  Follow metabolic panel.        Relevant Orders   Basic metabolic panel (Completed)   Hepatic function panel (Completed)   Health care maintenance    Physical today 10/07/17.  Mammogram 12/10/16 - Birads I.  Colonoscopy 06/2012.  Schedule f/u mammogram.       Loss of weight    Weight is relatively stable.  Eating well.  Follow.        Lung nodules    Has been followed by oncology and pulmonary.  Recent CT stable.  Currently doing well.  Breathing stable.  Follow.        Osteoporosis    Recheck bone density.  Previous on bisphosphonate.         Other Visit Diagnoses    Breast cancer screening       Relevant Orders   MM Digital Screening   Estrogen deficiency       Relevant Orders   DG Bone Density       Einar Pheasant, MD

## 2017-10-07 NOTE — Assessment & Plan Note (Addendum)
Physical today 10/07/17.  Mammogram 12/10/16 - Birads I.  Colonoscopy 06/2012.  Schedule f/u mammogram.

## 2017-10-08 ENCOUNTER — Encounter: Payer: Self-pay | Admitting: Internal Medicine

## 2017-10-10 ENCOUNTER — Encounter: Payer: Self-pay | Admitting: Internal Medicine

## 2017-10-10 NOTE — Assessment & Plan Note (Signed)
Weight is relatively stable.  Eating well.  Follow.

## 2017-10-10 NOTE — Assessment & Plan Note (Signed)
Blood pressure on recheck improved.  Continue same medication regimen.  Follow pressures.  Follow metabolic panel.   

## 2017-10-10 NOTE — Assessment & Plan Note (Signed)
Recheck bone density.  Previous on bisphosphonate.

## 2017-10-10 NOTE — Assessment & Plan Note (Signed)
Has been followed by oncology and pulmonary.  Recent CT stable.  Currently doing well.  Breathing stable.  Follow.

## 2017-10-14 NOTE — Telephone Encounter (Signed)
Unread mychart message mailed to patient 

## 2017-10-30 ENCOUNTER — Encounter: Payer: Self-pay | Admitting: Internal Medicine

## 2017-10-30 ENCOUNTER — Ambulatory Visit (INDEPENDENT_AMBULATORY_CARE_PROVIDER_SITE_OTHER): Payer: Medicare Other | Admitting: Internal Medicine

## 2017-10-30 VITALS — BP 136/82 | HR 60 | Ht 62.0 in | Wt 116.0 lb

## 2017-10-30 DIAGNOSIS — R918 Other nonspecific abnormal finding of lung field: Secondary | ICD-10-CM

## 2017-10-30 NOTE — Patient Instructions (Signed)
CT chest in 6 months Albuterol as needed

## 2017-10-30 NOTE — Progress Notes (Signed)
[facility] Consultation      Date: 10/30/2017,   MRN# [id number] Lisa Blackwell [birth year]    AdmissionWeight: 116 lb (52.6 kg)                 CurrentWeight: 116 lb (52.6 kg) Lisa Blackwell is a 84 y.o. old female seen in consultation for lung nodules at the request of Dr Nicki Reaper     CHIEF COMPLAINT:   Follow up pulmonary nodules    HISTORY OF PRESENT ILLNESS  84 yo pleasant white female seen today for follow up for abnormal CT chest with B/L Pulmonary nodules   No signs of infection at this time, no lower ext swelling No acute resp issues Uses albuterol for intermittent SOB  Patient had CXR in Feb which led to CT  Chest and PET scan and repeat CT chest most recent on 10/2015 and PET scan revealed hypermetabolic activity There were 4 nodules-stable appearance after 3 month interval back on May   Has some post nasal drip and constant throat clearing No signs of infection at this time She has frequent nose bleeds when she wakes up, otherwise no other complaints She has some exertional dyspnea with cough    Current Medication:  Current Outpatient Medications:  .  albuterol (PROVENTIL HFA;VENTOLIN HFA) 108 (90 Base) MCG/ACT inhaler, Inhale 2 puffs into the lungs every 4 (four) hours as needed for wheezing or shortness of breath. (Patient taking differently: Inhale 2 puffs into the lungs every 4 (four) hours as needed for wheezing or shortness of breath (patient stated she hasn't started taking medication). ), Disp: 1 Inhaler, Rfl: 2 .  aspirin EC 81 MG tablet, Take 81 mg by mouth daily., Disp: , Rfl:  .  fluticasone (FLONASE) 50 MCG/ACT nasal spray, SHAKE LIQUID AND USE 1 SPRAY IN EACH NOSTRIL EVERY DAY, Disp: 16 g, Rfl: 0 .  magnesium oxide (MAG-OX) 400 MG tablet, Take 1 tablet (400 mg total) by mouth daily as needed., Disp: 30 tablet, Rfl: 1 .  raloxifene (EVISTA) 60 MG tablet, take 1 tablet by mouth once daily, Disp: 30 tablet, Rfl: 11 .   telmisartan (MICARDIS) 40 MG tablet, TAKE 1 TABLET BY MOUTH EVERY DAY, Disp: 30 tablet, Rfl: 0 .  valACYclovir (VALTREX) 500 MG tablet, take 1 tablet by mouth twice a day if needed, Disp: 60 tablet, Rfl: 1  Current Facility-Administered Medications:  .  cetirizine (ZYRTEC) tablet 10 mg, 10 mg, Oral, Daily, Flora Lipps, MD    ALLERGIES   Patient has no known allergies.     REVIEW OF SYSTEMS   Review of Systems  Constitutional: Negative for chills, diaphoresis, fever, malaise/fatigue and weight loss.  HENT: Negative for congestion and hearing loss.   Respiratory: Positive for shortness of breath. Negative for cough, hemoptysis, sputum production and wheezing.   Cardiovascular: Negative for chest pain, palpitations, orthopnea and leg swelling.  Gastrointestinal: Negative for heartburn, nausea and vomiting.  Skin: Negative for rash.  Neurological: Negative for weakness and headaches.  All other systems reviewed and are negative.    VS: BP 136/82 (BP Location: Left Arm, Cuff Size: Normal)   Pulse 60   Ht 5\' 2"  (1.575 m)   Wt 116 lb (52.6 kg)   SpO2 98%   BMI 21.22 kg/m      PHYSICAL EXAM  Physical Exam  Constitutional: She is oriented to person, place, and time. No distress.  HENT:  Mouth/Throat: No oropharyngeal exudate.  Eyes: No scleral icterus.  Cardiovascular: Normal rate, regular rhythm and normal heart sounds.  No murmur heard. Pulmonary/Chest: Effort normal and breath sounds normal. No stridor. No respiratory distress. She has no wheezes. She has no rales.  Musculoskeletal: Normal range of motion. She exhibits no edema.  Neurological: She is alert and oriented to person, place, and time. No cranial nerve deficit.  Skin: Skin is warm. She is not diaphoretic.  Psychiatric: She has a normal mood and affect.             IMAGING    Stable pulmonary nodules IMPRESSION: 1. Stable bilateral pulmonary nodules compared to PET-CT and CTs dating back to  08/21/2015. Findings favor benign etiology. 2. Recommend continued surveillance with longer time interval.   ASSESSMENT/PLAN  84 yo very pleasant white female seen for follow up for abnormal CT chest with b/l Pulomonary Lung nodules  As listed below, The RUL, RML adn LUL nodules are hypermetabolic on PET scan Now has symptoms of allergic rhinits and mild reactive airways disease with excertion  Nodule#1  LUL 10-12 mm stable after 1 year Nodule#2  LLL 7 mm stable after 1 year Nodule#3  RUL 10 mm stable after 1 year Nodule#4  RML 7 mm stable after 1 year    1.will repeat CT chest without contrast to assess lung nodules in 6 months 2.continue flonase 3.continue zyrtec 4.albuterol as needed for mild reactive airways disease   Patient satisfied with Plan of action and management. All questions answered Follow up in 6 months Will discuss CT scan results when completed  Corrin Parker, M.D.  [facility]  Medical Director [facility] Cardio-Pulmonary Department

## 2017-10-31 ENCOUNTER — Other Ambulatory Visit: Payer: Self-pay | Admitting: Internal Medicine

## 2017-11-06 DIAGNOSIS — L570 Actinic keratosis: Secondary | ICD-10-CM | POA: Diagnosis not present

## 2017-11-06 DIAGNOSIS — L821 Other seborrheic keratosis: Secondary | ICD-10-CM | POA: Diagnosis not present

## 2017-11-06 DIAGNOSIS — L578 Other skin changes due to chronic exposure to nonionizing radiation: Secondary | ICD-10-CM | POA: Diagnosis not present

## 2017-11-07 ENCOUNTER — Other Ambulatory Visit: Payer: Self-pay | Admitting: *Deleted

## 2017-11-07 ENCOUNTER — Other Ambulatory Visit: Payer: Self-pay | Admitting: Internal Medicine

## 2017-11-07 MED ORDER — ALBUTEROL SULFATE HFA 108 (90 BASE) MCG/ACT IN AERS: 2.0000 | INHALATION_SPRAY | RESPIRATORY_TRACT | 0 refills | Status: DC | PRN

## 2017-11-09 ENCOUNTER — Other Ambulatory Visit: Payer: Self-pay | Admitting: Internal Medicine

## 2017-12-01 DIAGNOSIS — M5135 Other intervertebral disc degeneration, thoracolumbar region: Secondary | ICD-10-CM | POA: Diagnosis not present

## 2017-12-01 DIAGNOSIS — M5416 Radiculopathy, lumbar region: Secondary | ICD-10-CM | POA: Diagnosis not present

## 2017-12-05 ENCOUNTER — Telehealth: Payer: Self-pay

## 2017-12-05 ENCOUNTER — Encounter: Payer: Self-pay | Admitting: Internal Medicine

## 2017-12-05 ENCOUNTER — Ambulatory Visit (INDEPENDENT_AMBULATORY_CARE_PROVIDER_SITE_OTHER): Payer: Medicare Other | Admitting: Internal Medicine

## 2017-12-05 VITALS — BP 156/84 | HR 71 | Temp 97.7°F | Resp 18 | Wt 113.2 lb

## 2017-12-05 DIAGNOSIS — I10 Essential (primary) hypertension: Secondary | ICD-10-CM

## 2017-12-05 DIAGNOSIS — R918 Other nonspecific abnormal finding of lung field: Secondary | ICD-10-CM

## 2017-12-05 DIAGNOSIS — R634 Abnormal weight loss: Secondary | ICD-10-CM

## 2017-12-05 LAB — BASIC METABOLIC PANEL
BUN: 23 mg/dL (ref 6–23)
CALCIUM: 9.4 mg/dL (ref 8.4–10.5)
CO2: 28 meq/L (ref 19–32)
Chloride: 106 mEq/L (ref 96–112)
Creatinine, Ser: 0.87 mg/dL (ref 0.40–1.20)
GFR: 64.99 mL/min (ref >60.00)
Glucose, Bld: 94 mg/dL (ref 70–99)
POTASSIUM: 4.3 meq/L (ref 3.5–5.1)
SODIUM: 141 meq/L (ref 135–145)

## 2017-12-05 LAB — CBC WITH DIFFERENTIAL/PLATELET
BASOS PCT: 0.7 % (ref 0.0–3.0)
Basophils Absolute: 0 10*3/uL (ref 0.0–0.1)
EOS PCT: 0.5 % (ref 0.0–5.0)
Eosinophils Absolute: 0 10*3/uL (ref 0.0–0.7)
HCT: 41.6 % (ref 36.0–46.0)
Hemoglobin: 13.8 g/dL (ref 12.0–15.0)
LYMPHS ABS: 1.3 10*3/uL (ref 0.7–4.0)
Lymphocytes Relative: 22.3 % (ref 12.0–46.0)
MCHC: 33.1 g/dL (ref 30.0–36.0)
MCV: 89.9 fl (ref 78.0–100.0)
MONO ABS: 0.4 10*3/uL (ref 0.1–1.0)
Monocytes Relative: 7.6 % (ref 3.0–12.0)
NEUTROS ABS: 3.9 10*3/uL (ref 1.4–7.7)
NEUTROS PCT: 68.9 % (ref 43.0–77.0)
PLATELETS: 166 10*3/uL (ref 150.0–400.0)
RBC: 4.63 Mil/uL (ref 3.87–5.11)
RDW: 13.5 % (ref 11.5–15.5)
WBC: 5.6 10*3/uL (ref 4.0–10.5)

## 2017-12-05 LAB — HEPATIC FUNCTION PANEL
ALT: 9 U/L (ref 0–35)
AST: 17 U/L (ref 0–37)
Albumin: 3.8 g/dL (ref 3.5–5.2)
Alkaline Phosphatase: 102 U/L (ref 39–117)
BILIRUBIN TOTAL: 0.8 mg/dL (ref 0.2–1.2)
Bilirubin, Direct: 0.1 mg/dL (ref 0.0–0.3)
Total Protein: 6.5 g/dL (ref 6.0–8.3)

## 2017-12-05 LAB — TSH: TSH: 1.58 u[IU]/mL (ref 0.35–4.50)

## 2017-12-05 NOTE — Telephone Encounter (Signed)
Copied from [place] [id number]. Topic: Appointment Scheduling - Scheduling Inquiry for Clinic >> Dec 05, 2017 12:59 PM Scherrie Gerlach wrote: Reason for CRM: pt seen today and states dr Nicki Reaper wanted to see her in 8 weeks. (august appt) Nothing until 04/09/18 Please call pt to schedule. thanks

## 2017-12-05 NOTE — Progress Notes (Signed)
Patient ID: Lisa Blackwell, female   DOB: Jul 06, 1925, 84 y.o.   MRN: [id number]   Subjective:    Patient ID: Lisa Blackwell, female    DOB: Dec 21, 1925, 84 y.o.   MRN: [id number]  HPI  Patient here for a scheduled follow up.  She reports she is doing relatively well.  Sees Dr Mortimer Fries.  CT chest stable.  Last evaluated 10/30/17.  Recommended f/u chest CT in 6 months.  She is eating. No nausea or vomiting.  Bowels moving.  No abdominal pain.  Weight has decreased.  Discussed with her today.  Discussed adding nutritional supplements.     Past Medical History:  Diagnosis Date  . Compression fracture    Spine  . Heart murmur   . History of chicken pox   . Hx of colonic polyp   . Hypertension   . Internal hemorrhoid    Past Surgical History:  Procedure Laterality Date  . APPENDECTOMY  1957  . HEMORRHOID BANDING  March 2015   interal rubber band ligation   Family History  Problem Relation Age of Onset  . Breast cancer Mother        14's  . Arthritis Mother   . Colon cancer Father   . Stroke Father   . Heart disease Maternal Grandmother   . Heart disease Paternal Grandmother   . Breast cancer Maternal Aunt        70's   Social History   Socioeconomic History  . Marital status: Married    Spouse name: Not on file  . Number of children: 1  . Years of education: Not on file  . Highest education level: Not on file  Occupational History  . Not on file  Social Needs  . Financial resource strain: Not on file  . Food insecurity:    Worry: Not on file    Inability: Not on file  . Transportation needs:    Medical: Not on file    Non-medical: Not on file  Tobacco Use  . Smoking status: Never Smoker  . Smokeless tobacco: Never Used  Substance and Sexual Activity  . Alcohol use: No    Alcohol/week: 0.0 oz  . Drug use: No  . Sexual activity: Not on file  Lifestyle  . Physical activity:    Days per week: Not on file    Minutes per session: Not on file  . Stress: Not on file   Relationships  . Social connections:    Talks on phone: Not on file    Gets together: Not on file    Attends religious service: Not on file    Active member of club or organization: Not on file    Attends meetings of clubs or organizations: Not on file    Relationship status: Not on file  Other Topics Concern  . Not on file  Social History Narrative  . Not on file    Outpatient Encounter Medications as of 12/05/2017  Medication Sig  . albuterol (PROVENTIL HFA;VENTOLIN HFA) 108 (90 Base) MCG/ACT inhaler INHALE 2 PUFFS INTO THE LUNGS EVERY 4 HOURS AS NEEDED FOR WHEEZING OR SHORTNESS OF BREATH  . aspirin EC 81 MG tablet Take 81 mg by mouth daily.  . fluticasone (FLONASE) 50 MCG/ACT nasal spray SHAKE LIQUID AND USE 1 SPRAY IN EACH NOSTRIL EVERY DAY  . magnesium oxide (MAG-OX) 400 MG tablet Take 1 tablet (400 mg total) by mouth daily as needed.  . raloxifene (EVISTA) 60 MG tablet take 1  tablet by mouth once daily  . telmisartan (MICARDIS) 40 MG tablet TAKE 1 TABLET BY MOUTH EVERY DAY  . valACYclovir (VALTREX) 500 MG tablet take 1 tablet by mouth twice a day if needed   Facility-Administered Encounter Medications as of 12/05/2017  Medication  . cetirizine (ZYRTEC) tablet 10 mg    Review of Systems  Constitutional: Negative for appetite change and unexpected weight change.  HENT: Negative for congestion and sinus pressure.   Respiratory: Negative for cough, chest tightness and shortness of breath.   Cardiovascular: Negative for chest pain, palpitations and leg swelling.  Gastrointestinal: Negative for abdominal pain, diarrhea and nausea.  Genitourinary: Negative for difficulty urinating and dysuria.  Musculoskeletal: Negative for joint swelling and myalgias.  Skin: Negative for color change and rash.  Neurological: Negative for dizziness, light-headedness and headaches.  Psychiatric/Behavioral: Negative for agitation and dysphoric mood.       Objective:     Blood pressure  rechecked by me:  144/84  Physical Exam  Constitutional: She appears well-developed and well-nourished. No distress.  HENT:  Nose: Nose normal.  Mouth/Throat: Oropharynx is clear and moist.  Neck: Neck supple. No thyromegaly present.  Cardiovascular: Normal rate and regular rhythm.  Pulmonary/Chest: Breath sounds normal. No respiratory distress. She has no wheezes.  Abdominal: Soft. Bowel sounds are normal. There is no tenderness.  Musculoskeletal: She exhibits no edema or tenderness.  Lymphadenopathy:    She has no cervical adenopathy.  Skin: No rash noted. No erythema.  Psychiatric: She has a normal mood and affect. Her behavior is normal.    BP (!) 156/84 (BP Location: Left Arm, Patient Position: Sitting, Cuff Size: Normal)   Pulse 71   Temp 97.7 F (36.5 C) (Oral)   Resp 18   Wt 113 lb 3.2 oz (51.3 kg)   SpO2 95%   BMI 20.70 kg/m  Wt Readings from Last 3 Encounters:  12/05/17 113 lb 3.2 oz (51.3 kg)  10/30/17 116 lb (52.6 kg)  10/07/17 124 lb (56.2 kg)     Lab Results  Component Value Date   WBC 5.6 12/05/2017   HGB 13.8 12/05/2017   HCT 41.6 12/05/2017   PLT 166.0 12/05/2017   GLUCOSE 94 12/05/2017   CHOL 176 05/23/2017   TRIG 63.0 05/23/2017   HDL 67.40 05/23/2017   LDLCALC 96 05/23/2017   ALT 9 12/05/2017   AST 17 12/05/2017   NA 141 12/05/2017   K 4.3 12/05/2017   CL 106 12/05/2017   CREATININE 0.87 12/05/2017   BUN 23 12/05/2017   CO2 28 12/05/2017   TSH 1.58 12/05/2017    Ct Chest Wo Contrast  Result Date: 04/21/2017 CLINICAL DATA:  Shortness of breath. EXAM: CT CHEST WITHOUT CONTRAST TECHNIQUE: Multidetector CT imaging of the chest was performed following the standard protocol without IV contrast. COMPARISON:  07/02/2016.  08/21/2015 FINDINGS: Cardiovascular: Heart size upper normal. Coronary artery calcification is evident. Atherosclerotic calcification is noted in the wall of the thoracic aorta. Mediastinum/Nodes: No mediastinal lymphadenopathy.  No evidence for gross hilar lymphadenopathy although assessment is limited by the lack of intravenous contrast on today's study. There is no axillary lymphadenopathy. The esophagus has normal imaging features. Lungs/Pleura: Bilateral pulmonary nodules again noted. Left upper lobe nodule measures 11 mm today compared to 12 mm previously. Right upper lobe nodule on image 68 is 8 x 10 mm today which compares to 8 x 10 mm previously. Central right middle lobe 7 mm nodule seen on image 82 is unchanged. Several other  tiny bilateral pulmonary nodules are stable. The 7 mm index nodule at the left base (image 99) is unchanged. No focal airspace consolidation. No pulmonary edema or pleural effusion. Upper Abdomen: Stable hypoattenuating liver lesions, likely cysts. Musculoskeletal: Compression fractures at T12 and T9 are stable. IMPRESSION: 1. Stable bilateral pulmonary nodules comparing back to 08/21/2015. This provides nearly 2 years of stable imaging follow-up. Consider 1 additional follow-up CT scan in 6-12 months to confirm 2 years of imaging stability. 2. Stable compression fractures at T9 and T12. 3. Stable appearance of hypoattenuating liver lesions, likely benign. 4. Coronary artery and Aortic Atherosclerois (ICD10-170.0) Electronically Signed   By: Misty Stanley M.D.   On: 04/21/2017 15:40       Assessment & Plan:   Problem List Items Addressed This Visit    Essential hypertension, benign    Blood pressure on recheck improved.  Continue same medication regimen.  Follow pressures.  Follow metabolic panel.       Loss of weight - Primary    Weight down as outlined.  Discussed with her today.  No abdominal pain.  Eating.  No nausea or vomiting.  Discussed adding nutritional supplements with her meal.  Follow.  Check cbc, met b, tsh.        Relevant Orders   CBC with Differential/Platelet (Completed)   Hepatic function panel (Completed)   TSH (Completed)   Basic metabolic panel (Completed)   Lung  nodules    Worked up by oncology and pulmonary.  Just saw Dr Mortimer Fries 10/2017.  Stable CT.  Recommended f/u CT chest in 6 months.            Einar Pheasant, MD

## 2017-12-05 NOTE — Telephone Encounter (Signed)
Please call and schedule pt for f/u appt in 8 weeks. Thanks

## 2017-12-05 NOTE — Telephone Encounter (Signed)
Called and scheduled pt

## 2017-12-08 ENCOUNTER — Encounter: Payer: Self-pay | Admitting: Internal Medicine

## 2017-12-08 NOTE — Assessment & Plan Note (Signed)
Blood pressure on recheck improved.  Continue same medication regimen.  Follow pressures.  Follow metabolic panel.   

## 2017-12-08 NOTE — Assessment & Plan Note (Signed)
Worked up by oncology and pulmonary.  Just saw Dr Mortimer Fries 10/2017.  Stable CT.  Recommended f/u CT chest in 6 months.

## 2017-12-08 NOTE — Assessment & Plan Note (Addendum)
Weight down as outlined.  Discussed with her today.  No abdominal pain.  Eating.  No nausea or vomiting.  Discussed adding nutritional supplements with her meal.  Follow.  Check cbc, met b, tsh.

## 2017-12-11 ENCOUNTER — Ambulatory Visit
Admission: RE | Admit: 2017-12-11 | Discharge: 2017-12-11 | Disposition: A | Discharge status: Home / Self Care | Payer: Medicare Other | Source: Ambulatory Visit | Attending: Internal Medicine | Admitting: Internal Medicine

## 2017-12-11 DIAGNOSIS — Z1239 Encounter for other screening for malignant neoplasm of breast: Secondary | ICD-10-CM

## 2017-12-11 DIAGNOSIS — E2839 Other primary ovarian failure: Secondary | ICD-10-CM | POA: Diagnosis not present

## 2017-12-11 DIAGNOSIS — Z1231 Encounter for screening mammogram for malignant neoplasm of breast: Secondary | ICD-10-CM | POA: Insufficient documentation

## 2017-12-11 DIAGNOSIS — Z780 Asymptomatic menopausal state: Secondary | ICD-10-CM | POA: Diagnosis not present

## 2017-12-11 DIAGNOSIS — M810 Age-related osteoporosis without current pathological fracture: Secondary | ICD-10-CM | POA: Diagnosis not present

## 2018-01-01 ENCOUNTER — Ambulatory Visit (INDEPENDENT_AMBULATORY_CARE_PROVIDER_SITE_OTHER): Payer: Medicare Other | Admitting: Internal Medicine

## 2018-01-01 ENCOUNTER — Encounter: Payer: Self-pay | Admitting: Internal Medicine

## 2018-01-01 VITALS — BP 122/64 | HR 63 | Wt 113.6 lb

## 2018-01-01 DIAGNOSIS — I10 Essential (primary) hypertension: Secondary | ICD-10-CM | POA: Diagnosis not present

## 2018-01-01 DIAGNOSIS — R634 Abnormal weight loss: Secondary | ICD-10-CM

## 2018-01-01 DIAGNOSIS — M546 Pain in thoracic spine: Secondary | ICD-10-CM

## 2018-01-01 DIAGNOSIS — R918 Other nonspecific abnormal finding of lung field: Secondary | ICD-10-CM

## 2018-01-01 DIAGNOSIS — M810 Age-related osteoporosis without current pathological fracture: Secondary | ICD-10-CM | POA: Diagnosis not present

## 2018-01-01 NOTE — Progress Notes (Signed)
Patient ID: ADVIKA MCLELLAND, female   DOB: [birth year], 84 y.o.   MRN: [id number]   Subjective:    Patient ID: BISMA KLETT, female    DOB: Apr 27, 1926, 84 y.o.   MRN: [id number]  HPI  Patient here for a scheduled follow up.  She reports she is doing relatively well.  Feels good.  Tries to stay active.  No chest pain.  No sob.  No acid reflux.  No abdominal pain.  Bowels moving.  No urine change.  Discussed bone density.  Discussed treatment options.  She declined prolia.  Did agree to reclast.     Past Medical History:  Diagnosis Date  . Compression fracture    Spine  . Heart murmur   . History of chicken pox   . Hx of colonic polyp   . Hypertension   . Internal hemorrhoid    Past Surgical History:  Procedure Laterality Date  . APPENDECTOMY  1957  . HEMORRHOID BANDING  March 2015   interal rubber band ligation   Family History  Problem Relation Age of Onset  . Breast cancer Mother        43's  . Arthritis Mother   . Colon cancer Father   . Stroke Father   . Heart disease Maternal Grandmother   . Heart disease Paternal Grandmother   . Breast cancer Maternal Aunt        70's   Social History   Socioeconomic History  . Marital status: Married    Spouse name: Not on file  . Number of children: 1  . Years of education: Not on file  . Highest education level: Not on file  Occupational History  . Not on file  Social Needs  . Financial resource strain: Not on file  . Food insecurity:    Worry: Not on file    Inability: Not on file  . Transportation needs:    Medical: Not on file    Non-medical: Not on file  Tobacco Use  . Smoking status: Never Smoker  . Smokeless tobacco: Never Used  Substance and Sexual Activity  . Alcohol use: No    Alcohol/week: 0.0 oz  . Drug use: No  . Sexual activity: Not on file  Lifestyle  . Physical activity:    Days per week: Not on file    Minutes per session: Not on file  . Stress: Not on file  Relationships  . Social  connections:    Talks on phone: Not on file    Gets together: Not on file    Attends religious service: Not on file    Active member of club or organization: Not on file    Attends meetings of clubs or organizations: Not on file    Relationship status: Not on file  Other Topics Concern  . Not on file  Social History Narrative  . Not on file    Outpatient Encounter Medications as of 01/01/2018  Medication Sig  . albuterol (PROVENTIL HFA;VENTOLIN HFA) 108 (90 Base) MCG/ACT inhaler INHALE 2 PUFFS INTO THE LUNGS EVERY 4 HOURS AS NEEDED FOR WHEEZING OR SHORTNESS OF BREATH  . aspirin EC 81 MG tablet Take 81 mg by mouth daily.  . fluticasone (FLONASE) 50 MCG/ACT nasal spray SHAKE LIQUID AND USE 1 SPRAY IN EACH NOSTRIL EVERY DAY  . magnesium oxide (MAG-OX) 400 MG tablet Take 1 tablet (400 mg total) by mouth daily as needed.  . raloxifene (EVISTA) 60 MG tablet take 1 tablet by  mouth once daily  . telmisartan (MICARDIS) 40 MG tablet TAKE 1 TABLET BY MOUTH EVERY DAY  . valACYclovir (VALTREX) 500 MG tablet take 1 tablet by mouth twice a day if needed   Facility-Administered Encounter Medications as of 01/01/2018  Medication  . cetirizine (ZYRTEC) tablet 10 mg    Review of Systems  Constitutional: Negative for appetite change and unexpected weight change.  HENT: Negative for congestion and sinus pressure.   Respiratory: Negative for cough, chest tightness and shortness of breath.   Cardiovascular: Negative for chest pain, palpitations and leg swelling.  Gastrointestinal: Negative for abdominal pain, diarrhea, nausea and vomiting.  Genitourinary: Negative for difficulty urinating and dysuria.  Musculoskeletal: Negative for joint swelling and myalgias.  Skin: Negative for color change and rash.  Neurological: Negative for dizziness, light-headedness and headaches.  Psychiatric/Behavioral: Negative for agitation and dysphoric mood.       Objective:    Physical Exam  Constitutional: She  appears well-developed and well-nourished. No distress.  HENT:  Nose: Nose normal.  Mouth/Throat: Oropharynx is clear and moist.  Neck: Neck supple. No thyromegaly present.  Cardiovascular: Normal rate and regular rhythm.  Pulmonary/Chest: Breath sounds normal. No respiratory distress. She has no wheezes.  Abdominal: Soft. Bowel sounds are normal. There is no tenderness.  Musculoskeletal: She exhibits no edema or tenderness.  Lymphadenopathy:    She has no cervical adenopathy.  Skin: No rash noted. No erythema.  Psychiatric: She has a normal mood and affect. Her behavior is normal.    BP 122/64 (BP Location: Left Arm, Patient Position: Sitting, Cuff Size: Normal)   Pulse 63   Wt 113 lb 9.6 oz (51.5 kg)   SpO2 95%   BMI 20.78 kg/m  Wt Readings from Last 3 Encounters:  01/01/18 113 lb 9.6 oz (51.5 kg)  12/05/17 113 lb 3.2 oz (51.3 kg)  10/30/17 116 lb (52.6 kg)     Lab Results  Component Value Date   WBC 5.6 12/05/2017   HGB 13.8 12/05/2017   HCT 41.6 12/05/2017   PLT 166.0 12/05/2017   GLUCOSE 94 12/05/2017   CHOL 176 05/23/2017   TRIG 63.0 05/23/2017   HDL 67.40 05/23/2017   LDLCALC 96 05/23/2017   ALT 9 12/05/2017   AST 17 12/05/2017   NA 141 12/05/2017   K 4.3 12/05/2017   CL 106 12/05/2017   CREATININE 0.87 12/05/2017   BUN 23 12/05/2017   CO2 28 12/05/2017   TSH 1.58 12/05/2017    Dg Bone Density  Result Date: 12/11/2017 EXAM: DUAL X-RAY ABSORPTIOMETRY (DXA) FOR BONE MINERAL DENSITY IMPRESSION: Dear Dr Nicki Reaper, Your patient Eudell Julian completed a BMD test on 12/11/2017 using the [place] (analysis version: 14.10) manufactured by [employer]. The following summarizes the results of our evaluation. PATIENT BIOGRAPHICAL: Name: Maidie, Streight Patient ID: [id number] Birth Date: [birth year] Height: 62.0 in. Gender: Female Exam Date: 12/11/2017 Weight: 113.2 lbs. Indications: Advanced Age, Caucasian, Height Loss, History of Compression Fracture,  History of Osteoporosis, Postmenopausal Fractures: compression fracture Treatments: Evista, Flonase ASSESSMENT: The BMD measured at Forearm Radius 33% is 0.518 g/cm2 with a T-score of -4.1. This patient is considered osteoporotic according to [facility]) criteria. Lumbar spine was not utilized due to advanced degenerative changes. Site Region Measured Measured WHO Young Adult BMD Date       Age      Classification T-score DualFemur Total Right 12/11/2017 90.1 Osteoporosis -2.8 0.659 g/cm2 DualFemur Total Mean 12/11/2017 90.1 Osteoporosis -2.6 0.684  g/cm2 Left Forearm Radius 33% 12/11/2017 90.1 Osteoporosis -4.1 0.518 g/cm2 World Health Organization [facility]) criteria for post-menopausal, Caucasian Women: Normal:       T-score at or above -1 SD Osteopenia:   T-score between -1 and -2.5 SD Osteoporosis: T-score at or below -2.5 SD RECOMMENDATIONS: 1. All patients should optimize calcium and vitamin D intake. 2. Consider FDA-approved medical therapies in postmenopausal women and men aged 10 years and older, based on the following: a. A hip or vertebral(clinical or morphometric) fracture b. T-score < -2.5 at the femoral neck or spine after appropriate evaluation to exclude secondary causes c. Low bone mass (T-score between -1.0 and -2.5 at the femoral neck or spine) and a 10-year probability of a hip fracture > 3% or a 10-year probability of a major osteoporosis-related fracture > 20% based on the [place]-adapted WHO algorithm d. Clinician judgment and/or patient preferences may indicate treatment for people with 10-year fracture probabilities above or below these levels FOLLOW-UP: Patients with diagnosis of osteoporosis or at high risk for fracture should have regular bone mineral density tests. For patients eligible for Medicare, routine testing is allowed once every 2 years. The testing frequency can be increased to one year for patients who have rapidly progressing disease, those who are receiving or  discontinuing medical therapy to restore bone mass, or have additional risk factors. I have reviewed this report, and agree with the above findings. [facility] Radiology Electronically Signed   By: Lowella Grip III M.D.   On: 12/11/2017 10:28   Mm 3d Screen Breast Bilateral  Result Date: 12/11/2017 CLINICAL DATA:  Screening. EXAM: DIGITAL SCREENING BILATERAL MAMMOGRAM WITH TOMO AND CAD COMPARISON:  Previous exam(s). ACR Breast Density Category c: The breast tissue is heterogeneously dense, which may obscure small masses. FINDINGS: There are no findings suspicious for malignancy. Images were processed with CAD. IMPRESSION: No mammographic evidence of malignancy. A result letter of this screening mammogram will be mailed directly to the patient. RECOMMENDATION: Screening mammogram in one year. (Code:SM-B-01Y) BI-RADS CATEGORY  1: Negative. Electronically Signed   By: Kristopher Oppenheim M.D.   On: 12/11/2017 17:06       Assessment & Plan:   Problem List Items Addressed This Visit    Back pain    Stable.  Has seen Dr Sharlet Salina.        Essential hypertension, benign    Blood pressure under good control.  Continue same medication regimen.  Follow pressures.  Follow metabolic panel.        Loss of weight    Weight stable from the previous check.  Discussed adding nutritional shakes.  Follow.  No nausea or vomiting.  No abdominal pain. Bowels stable.        Lung nodules    Worked up by oncology and followed by pulmonary.  Seeing Dr Mortimer Fries.  Stable CT.  Recommended f/u in 6 months.        Osteoporosis - Primary    Discussed with her today regarding bone density results.  Previously on bisphosphonate.  Discussed prolia.  She declines.  Agreed to reclast.  Will complete form for PA.       Relevant Orders   VITAMIN D 25 Hydroxy (Vit-D Deficiency, Fractures) (Completed)   Urinalysis, Routine w reflex microscopic (Completed)       Einar Pheasant, MD

## 2018-01-02 LAB — URINALYSIS, ROUTINE W REFLEX MICROSCOPIC
Bilirubin Urine: NEGATIVE
Hgb urine dipstick: NEGATIVE
Ketones, ur: NEGATIVE
Leukocytes, UA: NEGATIVE
Nitrite: NEGATIVE
RBC / HPF: NONE SEEN (ref 0–?)
Total Protein, Urine: NEGATIVE
Urine Glucose: NEGATIVE
Urobilinogen, UA: 0.2 (ref 0.0–1.0)
pH: 5.5 (ref 5.0–8.0)

## 2018-01-02 LAB — VITAMIN D 25 HYDROXY (VIT D DEFICIENCY, FRACTURES): VITD: 35.87 ng/mL (ref 30.00–100.00)

## 2018-01-04 ENCOUNTER — Encounter: Payer: Self-pay | Admitting: Internal Medicine

## 2018-01-04 NOTE — Assessment & Plan Note (Signed)
Discussed with her today regarding bone density results.  Previously on bisphosphonate.  Discussed prolia.  She declines.  Agreed to reclast.  Will complete form for PA.

## 2018-01-04 NOTE — Assessment & Plan Note (Signed)
Stable.  Has seen Dr Sharlet Salina.

## 2018-01-04 NOTE — Assessment & Plan Note (Signed)
Weight stable from the previous check.  Discussed adding nutritional shakes.  Follow.  No nausea or vomiting.  No abdominal pain. Bowels stable.

## 2018-01-04 NOTE — Assessment & Plan Note (Signed)
Worked up by oncology and followed by pulmonary.  Seeing Dr Mortimer Fries.  Stable CT.  Recommended f/u in 6 months.

## 2018-01-04 NOTE — Assessment & Plan Note (Signed)
Blood pressure under good control.  Continue same medication regimen.  Follow pressures.  Follow metabolic panel.   

## 2018-01-18 IMAGING — CT CT CHEST W/O CM
1 series · 15 of 34 positions shown, 19 images · non-contrast
Comparison: CT 02/22/2016, PET-CT 09/07/2015, CT 08/21/2015

CLINICAL DATA: Bilateral pulmonary nodules.

EXAM:
CT CHEST WITHOUT CONTRAST
TECHNIQUE: Multidetector CT imaging of the chest was performed following the
standard protocol without IV contrast.

[Series 2: thorax · axial · 0.66mm/px · z∈[-516,-290]mm · 15 of 133 slices shown, 19 images]
[im 10/133  mediastinal]
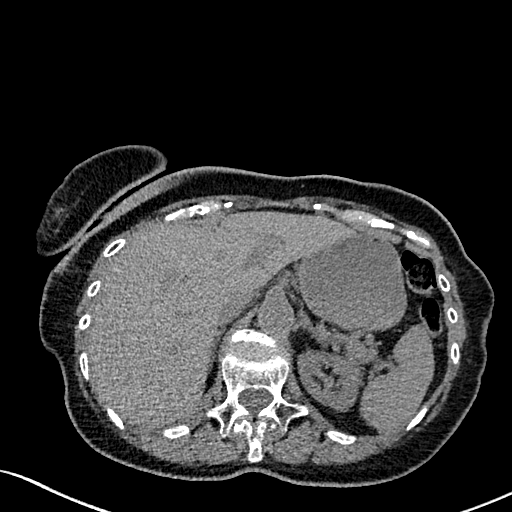
[im 10/133  lung]
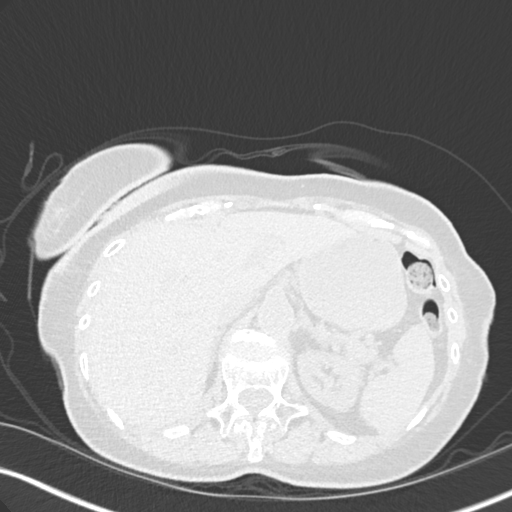
[im 20/133  lung]
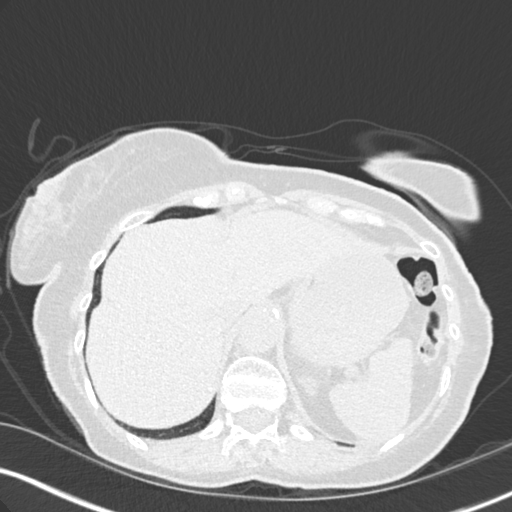
[im 27/133  lung]
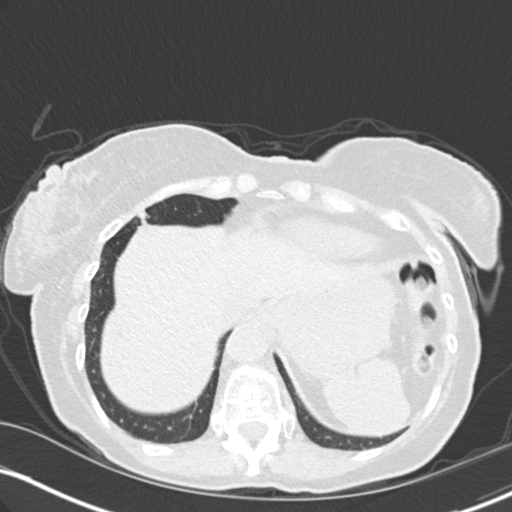
[im 35/133  lung]
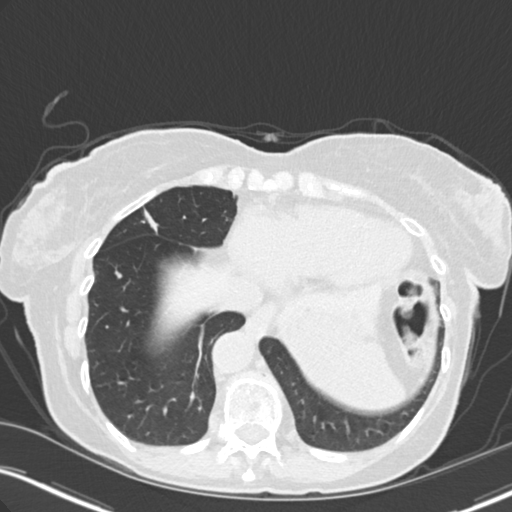
[im 45/133  mediastinal]
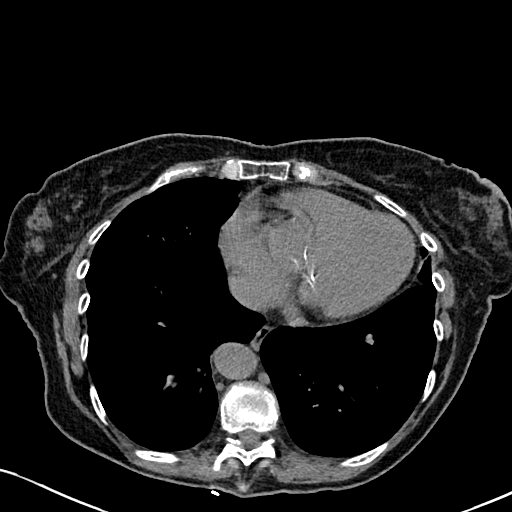
[im 45/133  lung]
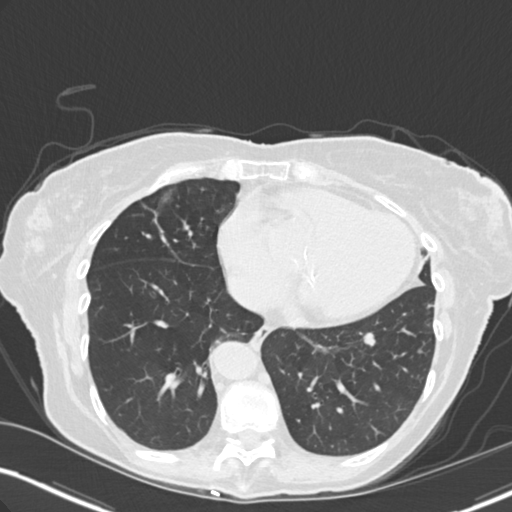
[im 53/133  lung]
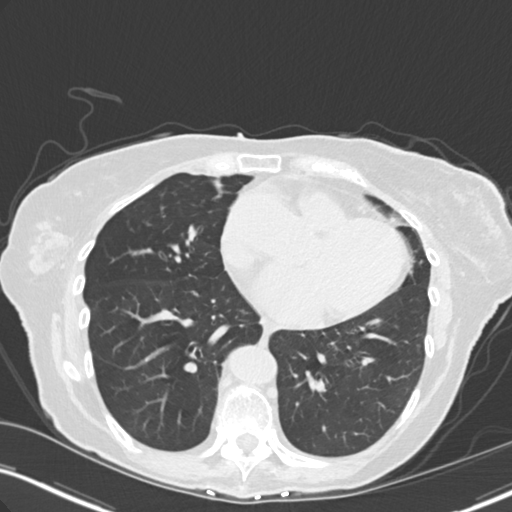
[im 59/133  lung]
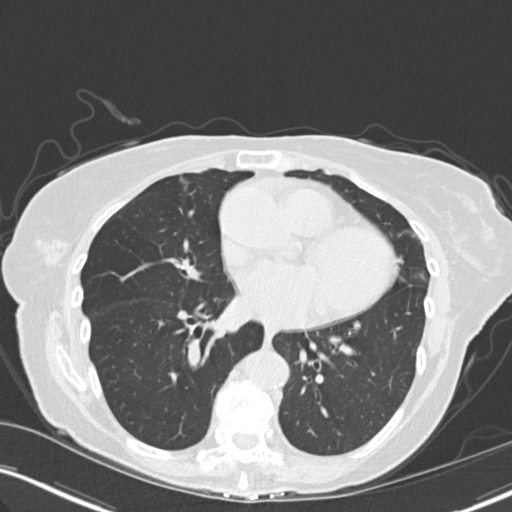
[im 69/133  lung]
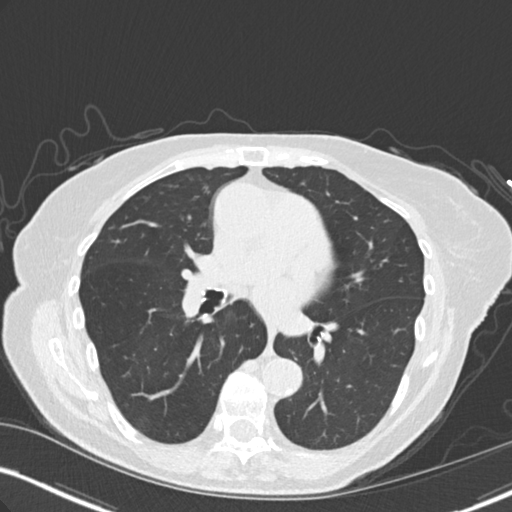
[im 74/133  mediastinal]
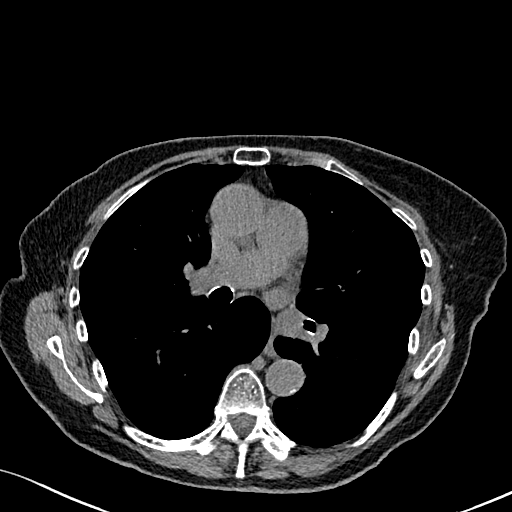
[im 74/133  lung]
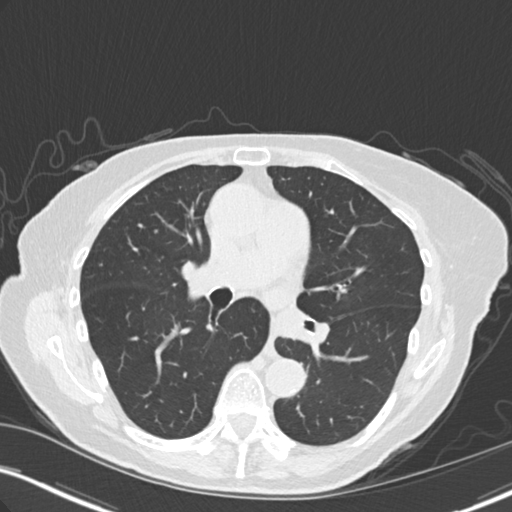
[im 80/133  lung]
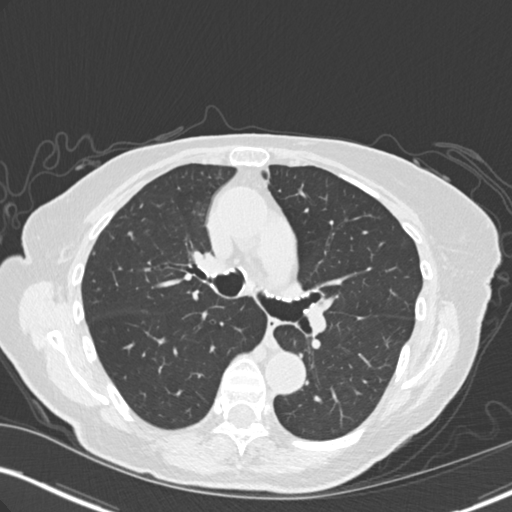
[im 89/133  lung]
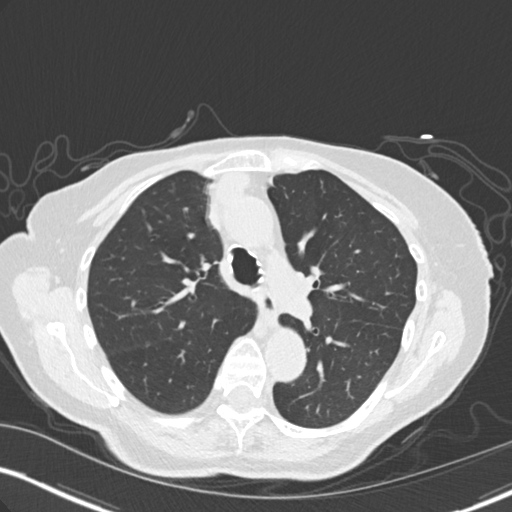
[im 98/133  lung]
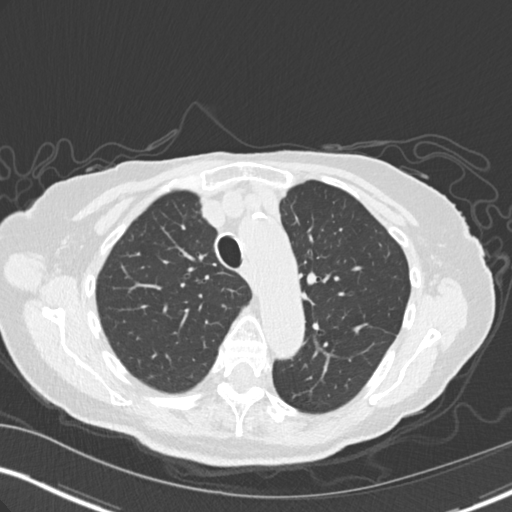
[im 106/133  mediastinal]
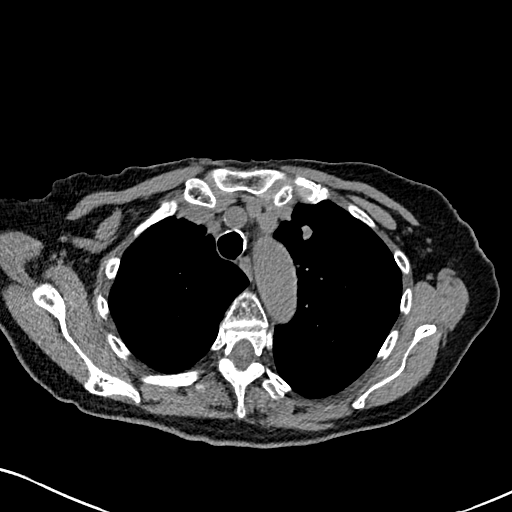
[im 106/133  lung]
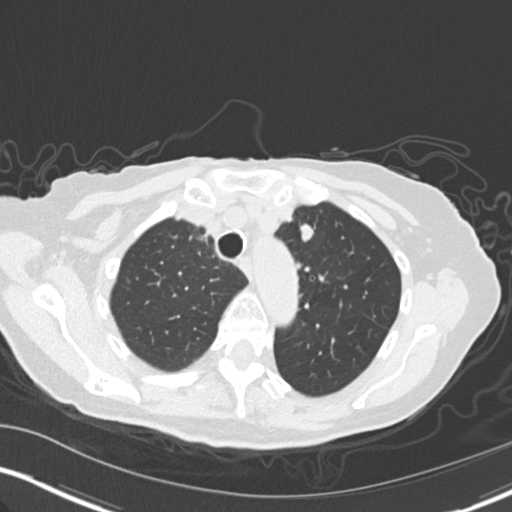
[im 113/133  lung]
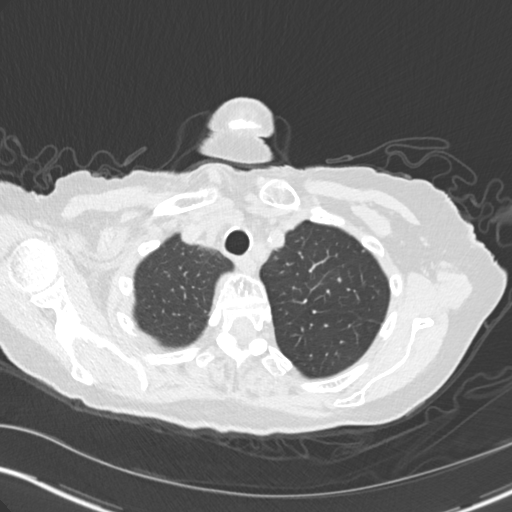
[im 123/133  lung]
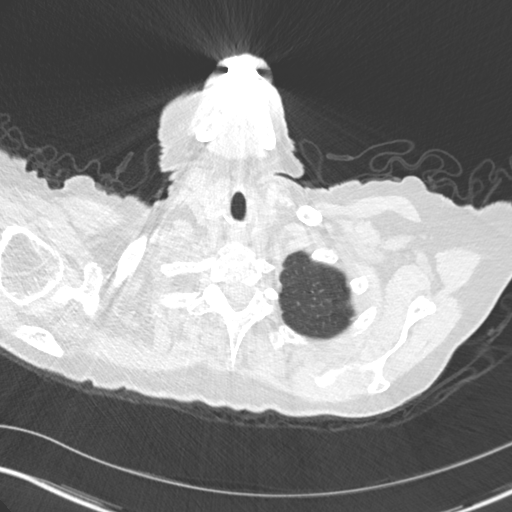

[15 of 34 positions shown; findings below may reference images not displayed]

FINDINGS: Cardiovascular: No significant vascular findings. Normal heart size.
No pericardial effusion.

Mediastinum/Nodes: No axillary supraclavicular adenopathy. No
mediastinal hilar adenopathy. No pericardial fluid.

Lungs/Pleura: Bilateral pulmonary nodules again demonstrated.

For example 12 mm in LEFT upper lobe nodule (image 28, series 3) not
changed from 11 mm.

RIGHT middle lobe nodule measures 10 mm x 8 mm not changed from 9 mm
x 11 mm (image 70, series 3)

LEFT lower lobe nodule measures 7 mm in (image 8, series 3 unchanged
from 7 mm.

No new pulmonary nodules.

Upper Abdomen: Low-density lesion in the liver appears benign.
Adrenal glands normal.

Musculoskeletal: No aggressive osseous lesion.
IMPRESSION: 1. Stable bilateral pulmonary nodules compared to PET-CT and CTs
dating back to 08/21/2015. Findings favor benign etiology.
2. Recommend continued surveillance with longer time interval.

## 2018-01-27 DIAGNOSIS — M810 Age-related osteoporosis without current pathological fracture: Secondary | ICD-10-CM | POA: Diagnosis not present

## 2018-01-29 ENCOUNTER — Ambulatory Visit (INDEPENDENT_AMBULATORY_CARE_PROVIDER_SITE_OTHER): Payer: Medicare Other | Admitting: Internal Medicine

## 2018-01-29 DIAGNOSIS — M810 Age-related osteoporosis without current pathological fracture: Secondary | ICD-10-CM | POA: Diagnosis not present

## 2018-01-29 DIAGNOSIS — I10 Essential (primary) hypertension: Secondary | ICD-10-CM | POA: Diagnosis not present

## 2018-01-29 DIAGNOSIS — R918 Other nonspecific abnormal finding of lung field: Secondary | ICD-10-CM | POA: Diagnosis not present

## 2018-01-29 DIAGNOSIS — R634 Abnormal weight loss: Secondary | ICD-10-CM

## 2018-01-29 NOTE — Progress Notes (Signed)
Patient ID: Lisa Blackwell, female   DOB: [birth year], 84 y.o.   MRN: [id number]   Subjective:    Patient ID: Lisa Blackwell, female    DOB: [birth year], 84 y.o.   MRN: [id number]  HPI  Patient here for a scheduled follow up.  She has been seeing pulmonary (Dr Mortimer Fries) for f/u bilateral pulmonary nodules.  Saw Dr Mortimer Fries 10/2017.  Felt stable.  Recommended f/u CT chest in 6 months.  Recommended continuing flonase and zyrtec.  States she notices some sob.  Appears some days are better than others.  No increased congestion.  Stable.  No chest pain.  No acid reflux.  No abdominal pain.  Bowels moving.  She request f/u appt with Dr Mortimer Fries.  States does not have appt scheduled.     Past Medical History:  Diagnosis Date  . Compression fracture    Spine  . Heart murmur   . History of chicken pox   . Hx of colonic polyp   . Hypertension   . Internal hemorrhoid    Past Surgical History:  Procedure Laterality Date  . APPENDECTOMY  1957  . HEMORRHOID BANDING  March 2015   interal rubber band ligation   Family History  Problem Relation Age of Onset  . Breast cancer Mother        46's  . Arthritis Mother   . Colon cancer Father   . Stroke Father   . Heart disease Maternal Grandmother   . Heart disease Paternal Grandmother   . Breast cancer Maternal Aunt        70's   Social History   Socioeconomic History  . Marital status: Married    Spouse name: Not on file  . Number of children: 1  . Years of education: Not on file  . Highest education level: Not on file  Occupational History  . Not on file  Social Needs  . Financial resource strain: Not on file  . Food insecurity:    Worry: Not on file    Inability: Not on file  . Transportation needs:    Medical: Not on file    Non-medical: Not on file  Tobacco Use  . Smoking status: Never Smoker  . Smokeless tobacco: Never Used  Substance and Sexual Activity  . Alcohol use: No    Alcohol/week: 0.0 standard drinks  . Drug use: No  .  Sexual activity: Not on file  Lifestyle  . Physical activity:    Days per week: Not on file    Minutes per session: Not on file  . Stress: Not on file  Relationships  . Social connections:    Talks on phone: Not on file    Gets together: Not on file    Attends religious service: Not on file    Active member of club or organization: Not on file    Attends meetings of clubs or organizations: Not on file    Relationship status: Not on file  Other Topics Concern  . Not on file  Social History Narrative  . Not on file    Outpatient Encounter Medications as of 01/29/2018  Medication Sig  . albuterol (PROVENTIL HFA;VENTOLIN HFA) 108 (90 Base) MCG/ACT inhaler INHALE 2 PUFFS INTO THE LUNGS EVERY 4 HOURS AS NEEDED FOR WHEEZING OR SHORTNESS OF BREATH  . aspirin EC 81 MG tablet Take 81 mg by mouth daily.  . fluticasone (FLONASE) 50 MCG/ACT nasal spray SHAKE LIQUID AND USE 1 SPRAY IN EACH NOSTRIL EVERY  DAY  . magnesium oxide (MAG-OX) 400 MG tablet Take 1 tablet (400 mg total) by mouth daily as needed.  . raloxifene (EVISTA) 60 MG tablet take 1 tablet by mouth once daily  . telmisartan (MICARDIS) 40 MG tablet TAKE 1 TABLET BY MOUTH EVERY DAY  . valACYclovir (VALTREX) 500 MG tablet take 1 tablet by mouth twice a day if needed   Facility-Administered Encounter Medications as of 01/29/2018  Medication  . cetirizine (ZYRTEC) tablet 10 mg    Review of Systems  Constitutional: Negative for appetite change and unexpected weight change.  HENT: Negative for sinus pressure.        No increased congestion.   Respiratory: Negative for cough and chest tightness.        Has noticed some sob as outlined.  Some days better than other days.  Overall feels stable.    Cardiovascular: Negative for chest pain, palpitations and leg swelling.  Gastrointestinal: Negative for abdominal pain, diarrhea, nausea and vomiting.  Genitourinary: Negative for difficulty urinating and dysuria.  Musculoskeletal: Negative for  joint swelling and myalgias.  Skin: Negative for color change and rash.  Neurological: Negative for dizziness.  Psychiatric/Behavioral: Negative for agitation and dysphoric mood.       Objective:    Physical Exam  Constitutional: She appears well-developed and well-nourished. No distress.  HENT:  Nose: Nose normal.  Mouth/Throat: Oropharynx is clear and moist.  Neck: Neck supple. No thyromegaly present.  Cardiovascular: Normal rate and regular rhythm.  Pulmonary/Chest: Breath sounds normal. No respiratory distress. She has no wheezes.  Abdominal: Soft. Bowel sounds are normal. There is no tenderness.  Musculoskeletal: She exhibits no edema or tenderness.  Lymphadenopathy:    She has no cervical adenopathy.  Skin: No rash noted. No erythema.  Psychiatric: She has a normal mood and affect. Her behavior is normal.    BP 112/78 (BP Location: Left Arm, Patient Position: Sitting, Cuff Size: Normal)   Pulse 70   Temp (!) 97.4 F (36.3 C) (Oral)   Resp 18   Wt 113 lb 6.4 oz (51.4 kg)   SpO2 97%   BMI 20.74 kg/m  Wt Readings from Last 3 Encounters:  01/29/18 113 lb 6.4 oz (51.4 kg)  01/01/18 113 lb 9.6 oz (51.5 kg)  12/05/17 113 lb 3.2 oz (51.3 kg)     Lab Results  Component Value Date   WBC 5.6 12/05/2017   HGB 13.8 12/05/2017   HCT 41.6 12/05/2017   PLT 166.0 12/05/2017   GLUCOSE 94 12/05/2017   CHOL 176 05/23/2017   TRIG 63.0 05/23/2017   HDL 67.40 05/23/2017   LDLCALC 96 05/23/2017   ALT 9 12/05/2017   AST 17 12/05/2017   NA 141 12/05/2017   K 4.3 12/05/2017   CL 106 12/05/2017   CREATININE 0.87 12/05/2017   BUN 23 12/05/2017   CO2 28 12/05/2017   TSH 1.58 12/05/2017    Dg Bone Density  Result Date: 12/11/2017 EXAM: DUAL X-RAY ABSORPTIOMETRY (DXA) FOR BONE MINERAL DENSITY IMPRESSION: Dear Dr Lisa Blackwell, Your patient Lisa Blackwell completed a BMD test on 12/11/2017 using the [place] (analysis version: 14.10) manufactured by [employer]. The  following summarizes the results of our evaluation. PATIENT BIOGRAPHICAL: Name: Lisa Blackwell, Lisa Blackwell Patient ID: [id number] Birth Date: 10/01/1925 Height: 62.0 in. Gender: Female Exam Date: 12/11/2017 Weight: 113.2 lbs. Indications: Advanced Age, Caucasian, Height Loss, History of Compression Fracture, History of Osteoporosis, Postmenopausal Fractures: compression fracture Treatments: Evista, Flonase ASSESSMENT: The BMD measured at  Forearm Radius 33% is 0.518 g/cm2 with a T-score of -4.1. This patient is considered osteoporotic according to [facility]) criteria. Lumbar spine was not utilized due to advanced degenerative changes. Site Region Measured Measured WHO Young Adult BMD Date       Age      Classification T-score DualFemur Total Right 12/11/2017 90.1 Osteoporosis -2.8 0.659 g/cm2 DualFemur Total Mean 12/11/2017 90.1 Osteoporosis -2.6 0.684 g/cm2 Left Forearm Radius 33% 12/11/2017 90.1 Osteoporosis -4.1 0.518 g/cm2 World Health Organization [facility]) criteria for post-menopausal, Caucasian Women: Normal:       T-score at or above -1 SD Osteopenia:   T-score between -1 and -2.5 SD Osteoporosis: T-score at or below -2.5 SD RECOMMENDATIONS: 1. All patients should optimize calcium and vitamin D intake. 2. Consider FDA-approved medical therapies in postmenopausal women and men aged 57 years and older, based on the following: a. A hip or vertebral(clinical or morphometric) fracture b. T-score < -2.5 at the femoral neck or spine after appropriate evaluation to exclude secondary causes c. Low bone mass (T-score between -1.0 and -2.5 at the femoral neck or spine) and a 10-year probability of a hip fracture > 3% or a 10-year probability of a major osteoporosis-related fracture > 20% based on the [place]-adapted WHO algorithm d. Clinician judgment and/or patient preferences may indicate treatment for people with 10-year fracture probabilities above or below these levels FOLLOW-UP: Patients with diagnosis of  osteoporosis or at high risk for fracture should have regular bone mineral density tests. For patients eligible for Medicare, routine testing is allowed once every 2 years. The testing frequency can be increased to one year for patients who have rapidly progressing disease, those who are receiving or discontinuing medical therapy to restore bone mass, or have additional risk factors. I have reviewed this report, and agree with the above findings. [facility] Radiology Electronically Signed   By: Lowella Grip III M.D.   On: 12/11/2017 10:28   Mm 3d Screen Breast Bilateral  Result Date: 12/11/2017 CLINICAL DATA:  Screening. EXAM: DIGITAL SCREENING BILATERAL MAMMOGRAM WITH TOMO AND CAD COMPARISON:  Previous exam(s). ACR Breast Density Category c: The breast tissue is heterogeneously dense, which may obscure small masses. FINDINGS: There are no findings suspicious for malignancy. Images were processed with CAD. IMPRESSION: No mammographic evidence of malignancy. A result letter of this screening mammogram will be mailed directly to the patient. RECOMMENDATION: Screening mammogram in one year. (Code:SM-B-01Y) BI-RADS CATEGORY  1: Negative. Electronically Signed   By: Kristopher Oppenheim M.D.   On: 12/11/2017 17:06       Assessment & Plan:   Problem List Items Addressed This Visit    Essential hypertension, benign    Blood pressure under good control.  Continue same medication regimen.  Follow pressures.  Follow metabolic panel.        Loss of weight    Weight is stable when compared to the last check.  Follow.  States she is eating well.        Lung nodules    Previously worked up by oncology.  Followed by Dr Mortimer Fries.  Last evaluated 10/2017.  Nodules stable.  Recommended f/u CT chest in 6 months.  With sob.  Some days better than others.  Overall appears to be relatively stable.  She wants a f/u appt scheduled with Dr Mortimer Fries.        Osteoporosis    Received reclast 01/27/2018.            Kerrilynn Derenzo,  Ellason Segar,  MD

## 2018-02-01 ENCOUNTER — Encounter: Payer: Self-pay | Admitting: Internal Medicine

## 2018-02-01 NOTE — Assessment & Plan Note (Signed)
Received reclast 01/27/2018.

## 2018-02-01 NOTE — Assessment & Plan Note (Signed)
Blood pressure under good control.  Continue same medication regimen.  Follow pressures.  Follow metabolic panel.   

## 2018-02-01 NOTE — Assessment & Plan Note (Signed)
Previously worked up by oncology.  Followed by Dr Mortimer Fries.  Last evaluated 10/2017.  Nodules stable.  Recommended f/u CT chest in 6 months.  With sob.  Some days better than others.  Overall appears to be relatively stable.  She wants a f/u appt scheduled with Dr Mortimer Fries.

## 2018-02-01 NOTE — Assessment & Plan Note (Signed)
Weight is stable when compared to the last check.  Follow.  States she is eating well.

## 2018-02-02 DIAGNOSIS — H6123 Impacted cerumen, bilateral: Secondary | ICD-10-CM | POA: Diagnosis not present

## 2018-02-02 DIAGNOSIS — H903 Sensorineural hearing loss, bilateral: Secondary | ICD-10-CM | POA: Diagnosis not present

## 2018-02-09 ENCOUNTER — Ambulatory Visit (INDEPENDENT_AMBULATORY_CARE_PROVIDER_SITE_OTHER): Payer: Medicare Other | Admitting: Internal Medicine

## 2018-02-09 ENCOUNTER — Encounter: Payer: Self-pay | Admitting: Internal Medicine

## 2018-02-09 VITALS — BP 116/80 | HR 72 | Resp 16 | Ht 62.0 in | Wt 112.0 lb

## 2018-02-09 DIAGNOSIS — R918 Other nonspecific abnormal finding of lung field: Secondary | ICD-10-CM | POA: Diagnosis not present

## 2018-02-09 DIAGNOSIS — J4520 Mild intermittent asthma, uncomplicated: Secondary | ICD-10-CM

## 2018-02-09 MED ORDER — TIOTROPIUM BROMIDE MONOHYDRATE 1.25 MCG/ACT IN AERS: 2.0000 | INHALATION_SPRAY | Freq: Two times a day (BID) | RESPIRATORY_TRACT | 5 refills | Status: DC

## 2018-02-09 NOTE — Progress Notes (Signed)
[place] [facility] Consultation      Date: 02/09/2018,   MRN# [id number] Lisa Blackwell 15-May-1926    Admission                  Current  Lisa Blackwell is a 84 y.o. old female seen in consultation for lung nodules at the request of Dr Nicki Reaper     CHIEF COMPLAINT:   Follow up [place]  Patient with slight increased SOB More wheezing Albuterol use more frequently    No signs of infection at this time, no lower ext swelling No acute resp issues    Follow up Pulmonary nodules Patient had CXR in Feb which led to CT  Chest and PET scan and repeat CT chest most recent on 10/2015 and PET scan revealed hypermetabolic activity There were 4 nodules-stable appearance after 3 month interval back on May     Current Medication:  Current Outpatient Medications:  .  albuterol (PROVENTIL HFA;VENTOLIN HFA) 108 (90 Base) MCG/ACT inhaler, INHALE 2 PUFFS INTO THE LUNGS EVERY 4 HOURS AS NEEDED FOR WHEEZING OR SHORTNESS OF BREATH, Disp: 42.5 g, Rfl: 0 .  aspirin EC 81 MG tablet, Take 81 mg by mouth daily., Disp: , Rfl:  .  fluticasone (FLONASE) 50 MCG/ACT nasal spray, SHAKE LIQUID AND USE 1 SPRAY IN EACH NOSTRIL EVERY DAY, Disp: 48 g, Rfl: 0 .  magnesium oxide (MAG-OX) 400 MG tablet, Take 1 tablet (400 mg total) by mouth daily as needed., Disp: 30 tablet, Rfl: 1 .  raloxifene (EVISTA) 60 MG tablet, take 1 tablet by mouth once daily, Disp: 30 tablet, Rfl: 11 .  telmisartan (MICARDIS) 40 MG tablet, TAKE 1 TABLET BY MOUTH EVERY DAY, Disp: 30 tablet, Rfl: 5 .  valACYclovir (VALTREX) 500 MG tablet, take 1 tablet by mouth twice a day if needed, Disp: 60 tablet, Rfl: 1  Current Facility-Administered Medications:  .  cetirizine (ZYRTEC) tablet 10 mg, 10 mg, Oral, Daily, Lisa Lipps, MD  MEDICATIONS: I have reviewed all medications and confirmed regimen as documented    ALLERGIES   Patient has no known allergies.     REVIEW OF SYSTEMS    Review of Systems  Constitutional: Negative for chills, diaphoresis, fever, malaise/fatigue and weight loss.  HENT: Negative for congestion and hearing loss.   Respiratory: Positive for shortness of breath. Negative for cough, hemoptysis, sputum production and wheezing.   Cardiovascular: Negative for chest pain, palpitations, orthopnea and leg swelling.  Gastrointestinal: Negative for heartburn, nausea and vomiting.  Skin: Negative for rash.  Neurological: Negative for weakness and headaches.  All other systems reviewed and are negative.    VS: Ht 5\' 2"  (1.575 m)   BMI 20.74 kg/m   BP 116/80 (BP Location: Left Arm, Cuff Size: Normal)   Pulse 72   Resp 16   Ht 5\' 2"  (1.575 m)   Wt 112 lb (50.8 kg)   SpO2 94%   BMI 20.49 kg/m     PHYSICAL EXAM  Physical Exam  Constitutional: She is oriented to person, place, and time. No distress.  HENT:  Mouth/Throat: No oropharyngeal exudate.  Eyes: No scleral icterus.  Cardiovascular: Normal rate, regular rhythm and normal heart sounds.  No murmur heard. Pulmonary/Chest: Effort normal and breath sounds normal. No stridor. No respiratory distress. She has no wheezes. She has no rales.  Musculoskeletal: Normal range of motion. She exhibits no edema.  Neurological: She is alert and oriented to person,  place, and time. No cranial nerve deficit.  Skin: Skin is warm. She is not diaphoretic.  Psychiatric: She has a normal mood and affect.         IMAGING    Stable pulmonary nodules IMPRESSION: 1. Stable bilateral pulmonary nodules compared to PET-CT and CTs dating back to 08/21/2015. Findings favor benign etiology. 2. Recommend continued surveillance with longer time interval.   ASSESSMENT/PLAN  84 yo very pleasant white female seen for follow up increased SOB with underlying mild reactive airways disease/ASTHMA  Also with  abnormal CT chest with b/l Pulomonary Lung nodules  As listed below, The RUL, RML and LUL nodules are  hypermetabolic on PET scan  Nodule#1  LUL 10-12 mm stable after 1 year Nodule#2  LLL 7 mm stable after 1 year Nodule#3  RUL 10 mm stable after 1 year Nodule#4  RML 7 mm stable after 1 year    1.will repeat CT chest without contrast to assess lung nodules in 6 months 2.continue flonase 3.continue zyrtec 4.albuterol as needed for mild reactive airways disease Will start Spiriva respimat 1.25 and assess resp status    Patient satisfied with Plan of action and management. All questions answered Follow up in 6 months Will discuss CT scan results when completed at next [place], M.D.  [facility]  Medical Director [place] Director [facility] Cardio-Pulmonary Department

## 2018-02-09 NOTE — Patient Instructions (Signed)
Start Spiriva Respimet 1.25 Continue to use Albuterol as needed

## 2018-02-09 NOTE — Addendum Note (Signed)
Addended by: Flora Lipps on: 02/09/2018 09:41 AM   Modules accepted: Orders

## 2018-02-09 NOTE — Addendum Note (Signed)
Addended by: Stephanie Coup on: 02/09/2018 10:36 AM   Modules accepted: Orders

## 2018-02-20 DIAGNOSIS — H903 Sensorineural hearing loss, bilateral: Secondary | ICD-10-CM | POA: Diagnosis not present

## 2018-03-05 ENCOUNTER — Other Ambulatory Visit: Payer: Self-pay | Admitting: Internal Medicine

## 2018-03-18 ENCOUNTER — Other Ambulatory Visit: Payer: Self-pay | Admitting: Internal Medicine

## 2018-04-24 ENCOUNTER — Other Ambulatory Visit: Payer: Self-pay | Admitting: Internal Medicine

## 2018-05-04 ENCOUNTER — Other Ambulatory Visit: Payer: Self-pay | Admitting: Internal Medicine

## 2018-05-07 ENCOUNTER — Ambulatory Visit (INDEPENDENT_AMBULATORY_CARE_PROVIDER_SITE_OTHER): Payer: Medicare Other | Admitting: Internal Medicine

## 2018-05-07 ENCOUNTER — Encounter: Payer: Self-pay | Admitting: Internal Medicine

## 2018-05-07 ENCOUNTER — Other Ambulatory Visit: Payer: Self-pay | Admitting: Internal Medicine

## 2018-05-07 VITALS — BP 130/70 | HR 81 | Ht 62.0 in | Wt 112.0 lb

## 2018-05-07 DIAGNOSIS — R918 Other nonspecific abnormal finding of lung field: Secondary | ICD-10-CM

## 2018-05-07 DIAGNOSIS — J4520 Mild intermittent asthma, uncomplicated: Secondary | ICD-10-CM | POA: Diagnosis not present

## 2018-05-07 NOTE — Patient Instructions (Signed)
Continue inhalers as presribed  Repeat CT chest in 6 months

## 2018-05-07 NOTE — Progress Notes (Signed)
[place] [facility] Consultation      Date: 05/07/2018,   MRN# [id number] Lisa Blackwell 12-03-1925    AdmissionWeight: 112 lb (50.8 kg)                 CurrentWeight: 112 lb (50.8 kg) Lisa Blackwell is a 84 y.o. old female seen in consultation for lung nodules at the request of Dr Nicki Reaper     CHIEF COMPLAINT:   Follow-up asthma shortness of breath   HISTORY OF PRESENT ILLNESS  Since last office visit I have started Spiriva Respimat Patient says she is doing well with this regimen Albuterol is less frequent  She states that she is tolerating her shortness of breath better No signs of infection No signs of exacerbation at this time  Follow-up pulmonary nodules Patient had stable nodules over the last 1 year I have recommended repeating CT chest in the next 6 months to assess these nodules They have been noted to be hypermetabolic on PET scan in the past  Patient has no signs of weight loss or appetite at this time       Current Medication:  Current Outpatient Medications:  .  albuterol (PROVENTIL HFA;VENTOLIN HFA) 108 (90 Base) MCG/ACT inhaler, INHALE 2 PUFFS INTO THE LUNGS EVERY 4 HOURS AS NEEDED FOR WHEEZING OR SHORTNESS OF BREATH, Disp: 25.5 g, Rfl: 0 .  aspirin EC 81 MG tablet, Take 81 mg by mouth daily., Disp: , Rfl:  .  fluticasone (FLONASE) 50 MCG/ACT nasal spray, SHAKE LIQUID AND USE 1 SPRAY IN EACH NOSTRIL EVERY DAY, Disp: 48 g, Rfl: 0 .  magnesium oxide (MAG-OX) 400 MG tablet, Take 1 tablet (400 mg total) by mouth daily as needed., Disp: 30 tablet, Rfl: 1 .  raloxifene (EVISTA) 60 MG tablet, TAKE 1 TABLET BY MOUTH ONCE DAILY, Disp: 90 tablet, Rfl: 0 .  telmisartan (MICARDIS) 40 MG tablet, TAKE 1 TABLET BY MOUTH EVERY DAY, Disp: 30 tablet, Rfl: 2 .  Tiotropium Bromide Monohydrate (SPIRIVA RESPIMAT) 1.25 MCG/ACT AERS, Inhale 2 puffs into the lungs 2 (two) times daily., Disp: 1 Inhaler, Rfl: 5 .  valACYclovir (VALTREX) 500 MG tablet, take 1  tablet by mouth twice a day if needed, Disp: 60 tablet, Rfl: 1  Current Facility-Administered Medications:  .  cetirizine (ZYRTEC) tablet 10 mg, 10 mg, Oral, Daily, Flora Lipps, MD  MEDICATIONS: I have reviewed all medications and confirmed regimen as documented    ALLERGIES   Patient has no known allergies.     REVIEW OF SYSTEMS   Review of Systems  Constitutional: Negative for chills, diaphoresis, fever, malaise/fatigue and weight loss.  HENT: Negative for congestion and hearing loss.   Respiratory: Positive for shortness of breath. Negative for cough, hemoptysis, sputum production and wheezing.   Cardiovascular: Negative for chest pain, palpitations, orthopnea and leg swelling.  Gastrointestinal: Negative for heartburn, nausea and vomiting.  Skin: Negative for rash.  Neurological: Negative for weakness and headaches.  All other systems reviewed and are negative.    VS: BP 130/70 (BP Location: Left Arm, Cuff Size: Normal)   Pulse 81   Ht 5\' 2"  (1.575 m)   Wt 112 lb (50.8 kg)   SpO2 94%   BMI 20.49 kg/m   BP 130/70 (BP Location: Left Arm, Cuff Size: Normal)   Pulse 81   Ht 5\' 2"  (1.575 m)   Wt 112 lb (50.8 kg)   SpO2 94%   BMI 20.49 kg/m     PHYSICAL EXAM  Physical Exam  Constitutional: She is oriented to person, place, and time. No distress.  HENT:  Mouth/Throat: No oropharyngeal exudate.  Eyes: No scleral icterus.  Cardiovascular: Normal rate, regular rhythm and normal heart sounds.  No murmur heard. Pulmonary/Chest: Effort normal and breath sounds normal. No stridor. No respiratory distress. She has no wheezes. She has no rales.  Musculoskeletal: Normal range of motion. She exhibits no edema.  Neurological: She is alert and oriented to person, place, and time. No cranial nerve deficit.  Skin: Skin is warm. She is not diaphoretic.  Psychiatric: She has a normal mood and affect.         IMAGING    Stable pulmonary nodules IMPRESSION: 1. Stable  bilateral pulmonary nodules compared to PET-CT and CTs dating back to 08/21/2015. Findings favor benign etiology. 2. Recommend continued surveillance with longer time interval.   ASSESSMENT/PLAN  84 year old very pleasant white female seen today for follow-up intermittent reactive airways disease likely asthma that is well controlled at this time with current inhaler therapy in the setting of chronic pulmonary lung nodules  Nodules are listed below Nodule#1  LUL 10-12 mm stable after 1 year Nodule#2  LLL 7 mm stable after 1 year Nodule#3  RUL 10 mm stable after 1 year Nodule#4  RML 7 mm stable after 1 year  #1 asthma well-controlled with long-acting anticholinergic Continue albuterol as needed No signs of infection or exacerbation at this time  no indication for steroids at this time Patient is happy with current regimen  #2 pulmonary nodules Repeat CT chest in 6 months without contrast  #3 allergic rhinitis continue Flonase and Zyrtec as prescribed   Patient  satisfied with Plan of action and management. All questions answered Follow up in 6 months   Somalia Segler Patricia Pesa, M.D.  [facility]  Medical Director [place] Director [facility] Cardio-Pulmonary Department

## 2018-05-15 ENCOUNTER — Telehealth: Payer: Self-pay | Admitting: Internal Medicine

## 2018-05-15 NOTE — Telephone Encounter (Signed)
Copied from [place] [phone]. Topic: General - Other >> May 15, 2018  2:21 PM Reyne Dumas L wrote: Reason for CRM:   Pt states she continues to get calls about the flu shot.  Pt states she she did get her flu shot on 03/14/2018 at [facility] on [address] in [place]. Pt can be reached at [phone]

## 2018-05-15 NOTE — Telephone Encounter (Signed)
I called patient & asked her to disregard phone calls since she already received flu shot.

## 2018-06-02 ENCOUNTER — Other Ambulatory Visit: Payer: Self-pay | Admitting: Internal Medicine

## 2018-06-05 ENCOUNTER — Ambulatory Visit: Payer: Medicare Other | Admitting: Internal Medicine

## 2018-06-19 ENCOUNTER — Other Ambulatory Visit: Payer: Self-pay | Admitting: Internal Medicine

## 2018-07-16 ENCOUNTER — Other Ambulatory Visit: Payer: Self-pay | Admitting: Internal Medicine

## 2018-08-03 ENCOUNTER — Other Ambulatory Visit: Payer: Self-pay | Admitting: Internal Medicine

## 2018-08-03 ENCOUNTER — Ambulatory Visit (INDEPENDENT_AMBULATORY_CARE_PROVIDER_SITE_OTHER): Payer: Medicare Other | Admitting: Internal Medicine

## 2018-08-03 ENCOUNTER — Encounter: Payer: Self-pay | Admitting: Internal Medicine

## 2018-08-03 DIAGNOSIS — R918 Other nonspecific abnormal finding of lung field: Secondary | ICD-10-CM

## 2018-08-03 DIAGNOSIS — I10 Essential (primary) hypertension: Secondary | ICD-10-CM

## 2018-08-03 DIAGNOSIS — M810 Age-related osteoporosis without current pathological fracture: Secondary | ICD-10-CM | POA: Diagnosis not present

## 2018-08-03 DIAGNOSIS — F439 Reaction to severe stress, unspecified: Secondary | ICD-10-CM

## 2018-08-03 NOTE — Assessment & Plan Note (Signed)
Received reclast 01/2018.

## 2018-08-03 NOTE — Progress Notes (Signed)
Patient ID: Lisa Blackwell, female   DOB: 1926-03-17, 85 y.o.   MRN: [id number]   Subjective:    Patient ID: Lisa Blackwell, female    DOB: [birth year], 85 y.o.   MRN: [id number]  HPI  Patient here for a scheduled follow up.  Increased stress.  Her husband just passed away.  Discussed with her today.  States she has good support.  Does not feel needs anything more at this time.  Eating.  Sleeping.  No chest pain.  Breathing stable.  No acid reflux.  No abdominal pain.  Bowels stable.  Seeing pulmonary. Last visit 05/07/18.  Spiriva.  Recommended f/u chest CT in 6 months.  Discussed shingles vaccine.     Past Medical History:  Diagnosis Date  . Compression fracture    Spine  . Heart murmur   . History of chicken pox   . Hx of colonic polyp   . Hypertension   . Internal hemorrhoid    Past Surgical History:  Procedure Laterality Date  . APPENDECTOMY  1957  . HEMORRHOID BANDING  March 2015   interal rubber band ligation   Family History  Problem Relation Age of Onset  . Breast cancer Mother        55's  . Arthritis Mother   . Colon cancer Father   . Stroke Father   . Heart disease Maternal Grandmother   . Heart disease Paternal Grandmother   . Breast cancer Maternal Aunt        70's   Social History   Socioeconomic History  . Marital status: Married    Spouse name: Not on file  . Number of children: 1  . Years of education: Not on file  . Highest education level: Not on file  Occupational History  . Not on file  Social Needs  . Financial resource strain: Not on file  . Food insecurity:    Worry: Not on file    Inability: Not on file  . Transportation needs:    Medical: Not on file    Non-medical: Not on file  Tobacco Use  . Smoking status: Never Smoker  . Smokeless tobacco: Never Used  Substance and Sexual Activity  . Alcohol use: No    Alcohol/week: 0.0 standard drinks  . Drug use: No  . Sexual activity: Not on file  Lifestyle  . Physical activity:   Days per week: Not on file    Minutes per session: Not on file  . Stress: Not on file  Relationships  . Social connections:    Talks on phone: Not on file    Gets together: Not on file    Attends religious service: Not on file    Active member of club or organization: Not on file    Attends meetings of clubs or organizations: Not on file    Relationship status: Not on file  Other Topics Concern  . Not on file  Social History Narrative  . Not on file    Outpatient Encounter Medications as of 08/03/2018  Medication Sig  . albuterol (PROVENTIL HFA;VENTOLIN HFA) 108 (90 Base) MCG/ACT inhaler INHALE 2 PUFFS INTO THE LUNGS EVERY 4 HOURS AS NEEDED FOR WHEEZING OR SHORTNESS OF BREATH  . aspirin EC 81 MG tablet Take 81 mg by mouth daily.  . Cholecalciferol 50 MCG (2000 UT) CAPS Take 2,000 Units by mouth daily.  . fluticasone (FLONASE) 50 MCG/ACT nasal spray INSTILL 1 SPRAY IN EACH NOSTRIL ONCE DAILY  . magnesium  oxide (MAG-OX) 400 MG tablet Take 1 tablet (400 mg total) by mouth daily as needed.  . raloxifene (EVISTA) 60 MG tablet TAKE 1 TABLET BY MOUTH ONCE DAILY  . telmisartan (MICARDIS) 40 MG tablet TAKE 1 TABLET BY MOUTH EVERY DAY  . Tiotropium Bromide Monohydrate (SPIRIVA RESPIMAT) 1.25 MCG/ACT AERS Inhale 2 puffs into the lungs 2 (two) times daily.  . valACYclovir (VALTREX) 500 MG tablet take 1 tablet by mouth twice a day if needed   Facility-Administered Encounter Medications as of 08/03/2018  Medication  . cetirizine (ZYRTEC) tablet 10 mg    Review of Systems  Constitutional: Negative for appetite change and unexpected weight change.  HENT: Negative for congestion and sinus pressure.   Respiratory: Negative for cough, chest tightness and shortness of breath.   Cardiovascular: Negative for chest pain, palpitations and leg swelling.  Gastrointestinal: Negative for abdominal pain, diarrhea, nausea and vomiting.  Genitourinary: Negative for difficulty urinating and dysuria.    Musculoskeletal: Negative for joint swelling and myalgias.  Skin: Negative for color change and rash.  Neurological: Negative for dizziness, light-headedness and headaches.  Psychiatric/Behavioral: Negative for agitation and dysphoric mood.       Objective:    Physical Exam Constitutional:      General: She is not in acute distress.    Appearance: Normal appearance.  HENT:     Nose: Nose normal. No congestion.     Mouth/Throat:     Pharynx: No oropharyngeal exudate or posterior oropharyngeal erythema.  Neck:     Musculoskeletal: Neck supple. No muscular tenderness.     Thyroid: No thyromegaly.  Cardiovascular:     Rate and Rhythm: Normal rate and regular rhythm.  Pulmonary:     Effort: No respiratory distress.     Breath sounds: Normal breath sounds. No wheezing.  Abdominal:     General: Bowel sounds are normal.     Palpations: Abdomen is soft.     Tenderness: There is no abdominal tenderness.  Musculoskeletal:        General: No swelling or tenderness.  Lymphadenopathy:     Cervical: No cervical adenopathy.  Skin:    Findings: No erythema or rash.  Neurological:     Mental Status: She is alert.  Psychiatric:        Behavior: Behavior normal.     BP 138/78 (BP Location: Left Arm, Patient Position: Sitting, Cuff Size: Normal)   Pulse 80   Temp (!) 97.5 F (36.4 C) (Oral)   Resp 16   Wt 113 lb 9.6 oz (51.5 kg)   SpO2 97%   BMI 20.78 kg/m  Wt Readings from Last 3 Encounters:  08/03/18 113 lb 9.6 oz (51.5 kg)  05/07/18 112 lb (50.8 kg)  02/09/18 112 lb (50.8 kg)     Lab Results  Component Value Date   WBC 5.6 12/05/2017   HGB 13.8 12/05/2017   HCT 41.6 12/05/2017   PLT 166.0 12/05/2017   GLUCOSE 94 12/05/2017   CHOL 176 05/23/2017   TRIG 63.0 05/23/2017   HDL 67.40 05/23/2017   LDLCALC 96 05/23/2017   ALT 9 12/05/2017   AST 17 12/05/2017   NA 141 12/05/2017   K 4.3 12/05/2017   CL 106 12/05/2017   CREATININE 0.87 12/05/2017   BUN 23 12/05/2017    CO2 28 12/05/2017   TSH 1.58 12/05/2017    Dg Bone Density  Result Date: 12/11/2017 EXAM: DUAL X-RAY ABSORPTIOMETRY (DXA) FOR BONE MINERAL DENSITY IMPRESSION: Dear Dr Nicki Reaper, Your patient  Lisa Blackwell completed a BMD test on 12/11/2017 using the [place] (analysis version: 14.10) manufactured by [employer]. The following summarizes the results of our evaluation. PATIENT BIOGRAPHICAL: Name: Malu, Pellegrini Patient ID: [id number] Birth Date: 06/26/1925 Height: 62.0 in. Gender: Female Exam Date: 12/11/2017 Weight: 113.2 lbs. Indications: Advanced Age, Caucasian, Height Loss, History of Compression Fracture, History of Osteoporosis, Postmenopausal Fractures: compression fracture Treatments: Evista, Flonase ASSESSMENT: The BMD measured at Forearm Radius 33% is 0.518 g/cm2 with a T-score of -4.1. This patient is considered osteoporotic according to [facility]) criteria. Lumbar spine was not utilized due to advanced degenerative changes. Site Region Measured Measured WHO Young Adult BMD Date       Age      Classification T-score DualFemur Total Right 12/11/2017 90.1 Osteoporosis -2.8 0.659 g/cm2 DualFemur Total Mean 12/11/2017 90.1 Osteoporosis -2.6 0.684 g/cm2 Left Forearm Radius 33% 12/11/2017 90.1 Osteoporosis -4.1 0.518 g/cm2 World Health Organization [facility]) criteria for post-menopausal, Caucasian Women: Normal:       T-score at or above -1 SD Osteopenia:   T-score between -1 and -2.5 SD Osteoporosis: T-score at or below -2.5 SD RECOMMENDATIONS: 1. All patients should optimize calcium and vitamin D intake. 2. Consider FDA-approved medical therapies in postmenopausal women and men aged 61 years and older, based on the following: a. A hip or vertebral(clinical or morphometric) fracture b. T-score < -2.5 at the femoral neck or spine after appropriate evaluation to exclude secondary causes c. Low bone mass (T-score between -1.0 and -2.5 at the femoral neck or spine) and a  10-year probability of a hip fracture > 3% or a 10-year probability of a major osteoporosis-related fracture > 20% based on the [place]-adapted WHO algorithm d. Clinician judgment and/or patient preferences may indicate treatment for people with 10-year fracture probabilities above or below these levels FOLLOW-UP: Patients with diagnosis of osteoporosis or at high risk for fracture should have regular bone mineral density tests. For patients eligible for Medicare, routine testing is allowed once every 2 years. The testing frequency can be increased to one year for patients who have rapidly progressing disease, those who are receiving or discontinuing medical therapy to restore bone mass, or have additional risk factors. I have reviewed this report, and agree with the above findings. [facility] Radiology Electronically Signed   By: Lowella Grip III M.D.   On: 12/11/2017 10:28   Mm 3d Screen Breast Bilateral  Result Date: 12/11/2017 CLINICAL DATA:  Screening. EXAM: DIGITAL SCREENING BILATERAL MAMMOGRAM WITH TOMO AND CAD COMPARISON:  Previous exam(s). ACR Breast Density Category c: The breast tissue is heterogeneously dense, which may obscure small masses. FINDINGS: There are no findings suspicious for malignancy. Images were processed with CAD. IMPRESSION: No mammographic evidence of malignancy. A result letter of this screening mammogram will be mailed directly to the patient. RECOMMENDATION: Screening mammogram in one year. (Code:SM-B-01Y) BI-RADS CATEGORY  1: Negative. Electronically Signed   By: Kristopher Oppenheim M.D.   On: 12/11/2017 17:06       Assessment & Plan:   Problem List Items Addressed This Visit    Essential hypertension, benign    Blood pressure under good control.  Continue same medication regimen.  Follow pressures.  Follow metabolic panel.        Relevant Orders   Hepatic function panel   Lipid panel   Basic metabolic panel   Lung nodules    Saw pulmonary 04/2018.  Recommended 6  month f/u CT chest.  Follow.  Osteoporosis    Received reclast 01/2018.        Relevant Medications   Cholecalciferol 50 MCG (2000 UT) CAPS   Stress    Increased stress as outlined.  Trying to cope with her husband's recent death.  Has good support.  Follow.            Einar Pheasant, MD

## 2018-08-03 NOTE — Assessment & Plan Note (Signed)
Blood pressure under good control.  Continue same medication regimen.  Follow pressures.  Follow metabolic panel.   

## 2018-08-03 NOTE — Assessment & Plan Note (Signed)
Saw pulmonary 04/2018.  Recommended 6 month f/u CT chest.  Follow.

## 2018-08-03 NOTE — Assessment & Plan Note (Signed)
Increased stress as outlined.  Trying to cope with her husband's recent death.  Has good support.  Follow.

## 2018-08-12 ENCOUNTER — Other Ambulatory Visit (INDEPENDENT_AMBULATORY_CARE_PROVIDER_SITE_OTHER): Payer: Medicare Other

## 2018-08-12 DIAGNOSIS — I10 Essential (primary) hypertension: Secondary | ICD-10-CM | POA: Diagnosis not present

## 2018-08-12 LAB — BASIC METABOLIC PANEL
BUN: 25 mg/dL — ABNORMAL HIGH (ref 6–23)
CALCIUM: 9.6 mg/dL (ref 8.4–10.5)
CO2: 30 mEq/L (ref 19–32)
Chloride: 106 mEq/L (ref 96–112)
Creatinine, Ser: 0.86 mg/dL (ref 0.40–1.20)
GFR: 61.87 mL/min (ref >60.00)
GLUCOSE: 90 mg/dL (ref 70–99)
POTASSIUM: 4.6 meq/L (ref 3.5–5.1)
Sodium: 142 mEq/L (ref 135–145)

## 2018-08-12 LAB — HEPATIC FUNCTION PANEL
ALK PHOS: 86 U/L (ref 39–117)
ALT: 10 U/L (ref 0–35)
AST: 19 U/L (ref 0–37)
Albumin: 3.8 g/dL (ref 3.5–5.2)
Bilirubin, Direct: 0.2 mg/dL (ref 0.0–0.3)
Total Bilirubin: 0.7 mg/dL (ref 0.2–1.2)
Total Protein: 6.5 g/dL (ref 6.0–8.3)

## 2018-08-12 LAB — LIPID PANEL
CHOLESTEROL: 177 mg/dL (ref 0–200)
HDL: 72.9 mg/dL (ref >39.00)
LDL Cholesterol: 87 mg/dL (ref 0–99)
NONHDL: 104.51
Total CHOL/HDL Ratio: 2
Triglycerides: 90 mg/dL (ref 0.0–149.0)
VLDL: 18 mg/dL (ref 0.0–40.0)

## 2018-08-13 ENCOUNTER — Encounter: Payer: Self-pay | Admitting: Internal Medicine

## 2018-08-13 ENCOUNTER — Ambulatory Visit: Admission: RE | Admit: 2018-08-13 | Payer: Medicare Other | Source: Ambulatory Visit

## 2018-08-27 ENCOUNTER — Other Ambulatory Visit: Payer: Self-pay | Admitting: Internal Medicine

## 2018-08-31 ENCOUNTER — Ambulatory Visit: Payer: Medicare Other | Admitting: Internal Medicine

## 2018-09-20 ENCOUNTER — Other Ambulatory Visit: Payer: Self-pay | Admitting: Internal Medicine

## 2018-11-12 ENCOUNTER — Other Ambulatory Visit: Payer: Self-pay | Admitting: Internal Medicine

## 2018-11-12 NOTE — Telephone Encounter (Signed)
Copied from [place] [phone]. Topic: Quick Communication - Rx Refill/Question >> Nov 12, 2018  4:08 PM [place] D wrote: Medication: telmisartan (MICARDIS) 40 MG tablet / Pt's daughter [name] stated the pharmacy told pt they have reached out several times for a refill with no result. No electronic request noted. Pt is now out of medication and needs refill. Daughter would like a callback once refill is sent in, preferably tomorrow so pt doesn't miss any doses. Please advise. CB#[phone]  Has the patient contacted their pharmacy? Yes.   (Agent: If no, request that the patient contact the pharmacy for the refill.) (Agent: If yes, when and what did the pharmacy advise?)  Preferred Pharmacy (with phone number or street name): [id number] - [place], [place] - [place] [phone] (Phone) [phone] (Fax)    Agent: Please be advised that RX refills may take up to 3 business days. We ask that you follow-up with your pharmacy.

## 2018-11-13 ENCOUNTER — Other Ambulatory Visit: Payer: Self-pay

## 2018-11-13 ENCOUNTER — Telehealth: Payer: Self-pay

## 2018-11-13 MED ORDER — RALOXIFENE HCL 60 MG PO TABS: 60.0000 mg | ORAL_TABLET | Freq: Every day | ORAL | 0 refills | Status: DC

## 2018-11-13 MED ORDER — TELMISARTAN 40 MG PO TABS: 40.0000 mg | ORAL_TABLET | Freq: Every day | ORAL | 2 refills | Status: DC

## 2018-11-13 NOTE — Telephone Encounter (Signed)
Called and spoke to pt's daughter, Al Pimple, and informed her that we would be unable to send a medication list to a private email address due to [place].  Informed pt's daughter that she could review the medication list on MyChart or come to the office to pick up list since daughter is on the [facility].  Pt's daughter said that she will try to view the medication list on MyChart first when she comes into town and if she has any problems she will have her mother call the office back.

## 2018-11-13 NOTE — Telephone Encounter (Signed)
Refill sent.

## 2018-11-13 NOTE — Telephone Encounter (Signed)
Copied from [place] [phone]. Topic: General - Other >> Nov 13, 2018  1:52 PM Leward Quan A wrote: Reason for CRM: Lisa Blackwell patients daughter called to say that he patient will be moving and she is asking for a copy of her moms medication list. She would like it to be E-mailed to [email or web address]. Ph# [phone]

## 2018-11-13 NOTE — Telephone Encounter (Signed)
Refill sent to pharmacy.  Lisa Blackwell,cma 

## 2018-11-13 NOTE — Progress Notes (Signed)
Refill for telmisarten was sent to pharmacy, per pt request.  Nina,cma

## 2018-11-13 NOTE — Telephone Encounter (Signed)
Patient's daughter calling in stating pharmacy sent a refill request for a different medication as well, raloxifene (EVISTA) 60 MG tablet.

## 2018-11-18 ENCOUNTER — Ambulatory Visit
Admission: RE | Admit: 2018-11-18 | Discharge: 2018-11-18 | Disposition: A | Discharge status: Home / Self Care | Payer: Medicare Other | Source: Ambulatory Visit | Attending: Internal Medicine | Admitting: Internal Medicine

## 2018-11-18 ENCOUNTER — Other Ambulatory Visit: Payer: Self-pay

## 2018-11-18 DIAGNOSIS — R918 Other nonspecific abnormal finding of lung field: Secondary | ICD-10-CM

## 2018-11-20 ENCOUNTER — Encounter: Payer: Self-pay | Admitting: Internal Medicine

## 2018-11-20 ENCOUNTER — Other Ambulatory Visit: Payer: Self-pay

## 2018-11-20 ENCOUNTER — Ambulatory Visit (INDEPENDENT_AMBULATORY_CARE_PROVIDER_SITE_OTHER): Payer: Medicare Other | Admitting: Internal Medicine

## 2018-11-20 DIAGNOSIS — J4520 Mild intermittent asthma, uncomplicated: Secondary | ICD-10-CM

## 2018-11-20 DIAGNOSIS — R918 Other nonspecific abnormal finding of lung field: Secondary | ICD-10-CM

## 2018-11-20 NOTE — Patient Instructions (Signed)
I have relayed the CT scan findings with the patient and daughter The nodules have increased in size slightly over the last 1.5 years Alternative options is to pursue electromagnetic navigational bronchoscopy or to follow-up with CT chest in 6 to 12 months with the pulmonologist in [place]  Alternative option is not to follow-up and to continue asthma therapy

## 2018-11-20 NOTE — Progress Notes (Signed)
[facility] Consultation      Date: 11/20/2018,   MRN# [id number] Lisa Blackwell [birth year]    Admission                  Current  Lisa Blackwell is a 85 y.o. old female seen in consultation for lung nodules at the request of Dr Ellin Mayhew VISIT    In the setting of the current Covid19 crisis, you are scheduled for a  visit with me on 11/20/2018  Just as we do with many in-office visits, in order for you to participate in this visit, we must obtain consent.   I can obtain your verbal consent now.  PATIENT AGREES AND CONFIRMS -YES This Visit has Audio and Visual Capabilities for optimal patient care experience   Evaluation Performed:  Follow-up visit  This visit type was conducted due to national recommendations for restrictions regarding the COVID-19 Pandemic (e.g. social distancing).  This format is felt to be most appropriate for this patient at this time.  All issues noted in this document were discussed and addressed.     Virtual Visit via Telephone Note     I connected with patient on 11/20/2018   by telephone and verified that I am speaking with the correct person using two identifiers.   I discussed the limitations, risks, security and privacy concerns of performing an evaluation and management service by telephone and the availability of in person appointments. I also discussed with the patient that there may be a patient responsible charge related to this service. The patient expressed understanding and agreed to proceed.   Location of the patient: Home Location of provider: Office Participating persons: Patient and provider only    CHIEF COMPLAINT:   Follow-up asthma Follow up Nodules   HISTORY [place]   Patient continues to be on Spiriva Respimat She is doing well with this regimen She is happy with this regimen Albuterol use is less frequent  No signs of infection at this time No signs of exacerbation at  this time  CT of the chest Nov 18, 2018 I personally reviewed the CT scans The right upper lobe and right lower lobe nodules have slightly increased in size since 2018  Seems that patient has lost weight and has decreased appetite   Images are noted below Right upper lobe nodule approximately 11 mm previously 10 mm   Left upper lobe nodule stable   Right lower lobe nodule 8.5 mm previously 7 mm   Findings relayed to patient and daughter  Current Medication:  Current Outpatient Medications:  .  albuterol (PROVENTIL HFA;VENTOLIN HFA) 108 (90 Base) MCG/ACT inhaler, INHALE 2 PUFFS INTO THE LUNGS EVERY 4 HOURS AS NEEDED FOR WHEEZING OR SHORTNESS OF BREATH, Disp: 25.5 g, Rfl: 0 .  aspirin EC 81 MG tablet, Take 81 mg by mouth daily., Disp: , Rfl:  .  Cholecalciferol 50 MCG (2000 UT) CAPS, Take 2,000 Units by mouth daily., Disp: , Rfl:  .  fluticasone (FLONASE) 50 MCG/ACT nasal spray, INSTILL 1 SPRAY IN EACH NOSTRIL ONCE DAILY, Disp: 48 g, Rfl: 3 .  magnesium oxide (MAG-OX) 400 MG tablet, Take 1 tablet (400 mg total) by mouth daily as needed., Disp: 30 tablet, Rfl: 1 .  raloxifene (EVISTA) 60 MG tablet, Take 1 tablet (60 mg total) by mouth daily., Disp: 90 tablet, Rfl: 0 .  telmisartan (MICARDIS) 40 MG tablet, Take 1 tablet (40 mg total) by  mouth daily., Disp: 30 tablet, Rfl: 2 .  Tiotropium Bromide Monohydrate (SPIRIVA RESPIMAT) 1.25 MCG/ACT AERS, Inhale 2 puffs into the lungs 2 (two) times daily., Disp: 1 Inhaler, Rfl: 5 .  valACYclovir (VALTREX) 500 MG tablet, take 1 tablet by mouth twice a day if needed, Disp: 60 tablet, Rfl: 1  Current Facility-Administered Medications:  .  cetirizine (ZYRTEC) tablet 10 mg, 10 mg, Oral, Daily, Flora Lipps, MD  MEDICATIONS: I have reviewed all medications and confirmed regimen as documented    ALLERGIES   Patient has no known allergies.     REVIEW OF SYSTEMS   Review of Systems  Constitutional: Negative for chills, diaphoresis, fever,  malaise/fatigue and weight loss.  HENT: Negative for congestion and hearing loss.   Respiratory: Positive for shortness of breath. Negative for cough, hemoptysis, sputum production and wheezing.   Cardiovascular: Negative for chest pain, palpitations, orthopnea and leg swelling.  Gastrointestinal: Negative for heartburn, nausea and vomiting.  Skin: Negative for rash.  Neurological: Negative for weakness and headaches.  All other systems reviewed and are negative.      ASSESSMENT/PLAN  85 year old pleasant white female for follow-up visit today for intermittent reactive airways disease related to her asthma which is well controlled at this time with current inhaler regimen and therapy in the setting of chronic pulmonary lung nodules   Nodules are listed below Nodule#1  LUL 10-12 mm stable after 1 year Nodule#2  LLL 7 mm stable after 1 year Nodule#3  RUL 11 mm -previous 10 mm Nodule#4  RML 8.5 mm previous 7 mm   The right upper lobe and right middle lobe lung nodules have slightly increased in size since the previous CT scan This has been relayed to the patient and the daughter  At this time if patient wishes to pursue definitive diagnosis she will need electromagnetic navigational bronchoscopy however in her advanced age and frail nature she is at high risk for postop pulmonary complications including pneumothorax stroke and respiratory failure and death.  I have relayed this to the patient and the daughter The patient will be moving to [place] to be closer to family and I have advised to follow-up with the pulmonologist in their area for further assessment and management    Asthma well-controlled with long-acting anticholinergic Continue albuterol as needed No signs of infection at this time No signs of exacerbation at this time No indication for steroids Patient is happy with current regimen     COVID-19 EDUCATION: The signs and symptoms of COVID-19 were  discussed with the patient and how to seek care for testing.  The importance of social distancing was discussed today. Hand Washing Techniques and avoid touching face was advised.  MEDICATION ADJUSTMENTS/LABS AND TESTS ORDERED: If patient wishes to pursue diagnostic assessment she will need electromagnetic navigational bronchoscopy Alternative plan is to follow-up with CT chest in 6 to 12 months This plan has been relayed to the patient and the daughter  [place]   Patient/famioy are  satisfied with Plan of action and management. All questions answered  Total time spent 42 minutes  Corrin Parker, M.D.  [facility]  Medical Director [place] Director [facility]

## 2018-12-04 ENCOUNTER — Ambulatory Visit (INDEPENDENT_AMBULATORY_CARE_PROVIDER_SITE_OTHER): Payer: Medicare Other | Admitting: Internal Medicine

## 2018-12-04 ENCOUNTER — Other Ambulatory Visit: Payer: Self-pay

## 2018-12-04 ENCOUNTER — Encounter: Payer: Self-pay | Admitting: Internal Medicine

## 2018-12-04 VITALS — BP 148/88 | HR 96 | Temp 97.4°F | Resp 16 | Wt 113.6 lb

## 2018-12-04 DIAGNOSIS — R634 Abnormal weight loss: Secondary | ICD-10-CM

## 2018-12-04 DIAGNOSIS — I10 Essential (primary) hypertension: Secondary | ICD-10-CM | POA: Diagnosis not present

## 2018-12-04 DIAGNOSIS — F439 Reaction to severe stress, unspecified: Secondary | ICD-10-CM | POA: Diagnosis not present

## 2018-12-04 DIAGNOSIS — Z0000 Encounter for general adult medical examination without abnormal findings: Secondary | ICD-10-CM

## 2018-12-04 DIAGNOSIS — Z1231 Encounter for screening mammogram for malignant neoplasm of breast: Secondary | ICD-10-CM

## 2018-12-04 DIAGNOSIS — M810 Age-related osteoporosis without current pathological fracture: Secondary | ICD-10-CM | POA: Diagnosis not present

## 2018-12-04 DIAGNOSIS — Z1239 Encounter for other screening for malignant neoplasm of breast: Secondary | ICD-10-CM

## 2018-12-04 DIAGNOSIS — R918 Other nonspecific abnormal finding of lung field: Secondary | ICD-10-CM

## 2018-12-04 LAB — CBC WITH DIFFERENTIAL/PLATELET
Basophils Absolute: 0 10*3/uL (ref 0.0–0.1)
Basophils Relative: 0.7 % (ref 0.0–3.0)
Eosinophils Absolute: 0 10*3/uL (ref 0.0–0.7)
Eosinophils Relative: 0.4 % (ref 0.0–5.0)
HCT: 39.7 % (ref 36.0–46.0)
Hemoglobin: 13.1 g/dL (ref 12.0–15.0)
Lymphocytes Relative: 21.2 % (ref 12.0–46.0)
Lymphs Abs: 1.1 10*3/uL (ref 0.7–4.0)
MCHC: 32.9 g/dL (ref 30.0–36.0)
MCV: 91.5 fl (ref 78.0–100.0)
Monocytes Absolute: 0.4 10*3/uL (ref 0.1–1.0)
Monocytes Relative: 7.6 % (ref 3.0–12.0)
Neutro Abs: 3.7 10*3/uL (ref 1.4–7.7)
Neutrophils Relative %: 70.1 % (ref 43.0–77.0)
Platelets: 181 10*3/uL (ref 150.0–400.0)
RBC: 4.34 Mil/uL (ref 3.87–5.11)
RDW: 13.9 % (ref 11.5–15.5)
WBC: 5.2 10*3/uL (ref 4.0–10.5)

## 2018-12-04 LAB — COMPREHENSIVE METABOLIC PANEL
ALT: 10 U/L (ref 0–35)
AST: 17 U/L (ref 0–37)
Albumin: 3.8 g/dL (ref 3.5–5.2)
Alkaline Phosphatase: 85 U/L (ref 39–117)
BUN: 25 mg/dL — ABNORMAL HIGH (ref 6–23)
CO2: 29 mEq/L (ref 19–32)
Calcium: 9.2 mg/dL (ref 8.4–10.5)
Chloride: 107 mEq/L (ref 96–112)
Creatinine, Ser: 0.88 mg/dL (ref 0.40–1.20)
GFR: 60.21 mL/min (ref >60.00)
Glucose, Bld: 86 mg/dL (ref 70–99)
Potassium: 4.2 mEq/L (ref 3.5–5.1)
Sodium: 143 mEq/L (ref 135–145)
Total Bilirubin: 0.5 mg/dL (ref 0.2–1.2)
Total Protein: 6.1 g/dL (ref 6.0–8.3)

## 2018-12-04 LAB — TSH: TSH: 1.76 u[IU]/mL (ref 0.35–4.50)

## 2018-12-04 NOTE — Progress Notes (Signed)
Patient ID: Lisa Blackwell, female   DOB: 1926-01-10, 85 y.o.   MRN: [id number]   Subjective:    Patient ID: Lisa Blackwell, female    DOB: 1925/11/03, 85 y.o.   MRN: [id number]  HPI  Patient here for her physical exam.  She reports she is doing well. Has been seeing Dr Mortimer Fries.  On spiriva.  Breathing stable.  No increased cough and congestion.  No chest pain.  Staying in due to covid restrictions.  No fever.  No sob.  No acid reflux.  No abdominal pain.  Bowels moving.  Husband recently passed away.  She is trying to cop with this stress.  Has good support. Overall she feels she is doing relatively well.  Planning to move close to her family.       Past Medical History:  Diagnosis Date   Compression fracture    Spine   Heart murmur    History of chicken pox    Hx of colonic polyp    Hypertension    Internal hemorrhoid    Past Surgical History:  Procedure Laterality Date   APPENDECTOMY  1957   HEMORRHOID BANDING  March 2015   interal rubber band ligation   Family History  Problem Relation Age of Onset   Breast cancer Mother        37's   Arthritis Mother    Colon cancer Father    Stroke Father    Heart disease Maternal Grandmother    Heart disease Paternal Grandmother    Breast cancer Maternal Aunt        70's   Social History   Socioeconomic History   Marital status: Married    Spouse name: Not on file   Number of children: 1   Years of education: Not on file   Highest education level: Not on file  Occupational History   Not on file  Social Needs   Financial resource strain: Not on file   Food insecurity    Worry: Not on file    Inability: Not on file   Transportation needs    Medical: Not on file    Non-medical: Not on file  Tobacco Use   Smoking status: Never Smoker   Smokeless tobacco: Never Used  Substance and Sexual Activity   Alcohol use: No    Alcohol/week: 0.0 standard drinks   Drug use: No   Sexual activity: Not on  file  Lifestyle   Physical activity    Days per week: Not on file    Minutes per session: Not on file   Stress: Not on file  Relationships   Social connections    Talks on phone: Not on file    Gets together: Not on file    Attends religious service: Not on file    Active member of club or organization: Not on file    Attends meetings of clubs or organizations: Not on file    Relationship status: Not on file  Other Topics Concern   Not on file  Social History Narrative   Not on file    Outpatient Encounter Medications as of 12/04/2018  Medication Sig   albuterol (PROVENTIL HFA;VENTOLIN HFA) 108 (90 Base) MCG/ACT inhaler INHALE 2 PUFFS INTO THE LUNGS EVERY 4 HOURS AS NEEDED FOR WHEEZING OR SHORTNESS OF BREATH   aspirin EC 81 MG tablet Take 81 mg by mouth daily.   Cholecalciferol 50 MCG (2000 UT) CAPS Take 2,000 Units by mouth daily.  fluticasone (FLONASE) 50 MCG/ACT nasal spray INSTILL 1 SPRAY IN EACH NOSTRIL ONCE DAILY   magnesium oxide (MAG-OX) 400 MG tablet Take 1 tablet (400 mg total) by mouth daily as needed.   raloxifene (EVISTA) 60 MG tablet Take 1 tablet (60 mg total) by mouth daily.   telmisartan (MICARDIS) 40 MG tablet Take 1 tablet (40 mg total) by mouth daily.   Tiotropium Bromide Monohydrate (SPIRIVA RESPIMAT) 1.25 MCG/ACT AERS Inhale 2 puffs into the lungs 2 (two) times daily.   valACYclovir (VALTREX) 500 MG tablet take 1 tablet by mouth twice a day if needed   Facility-Administered Encounter Medications as of 12/04/2018  Medication   cetirizine (ZYRTEC) tablet 10 mg    Review of Systems  Constitutional: Negative for appetite change and unexpected weight change.  HENT: Negative for congestion and sinus pressure.   Respiratory: Negative for chest tightness and shortness of breath.        No significant cough.   Cardiovascular: Negative for chest pain, palpitations and leg swelling.  Gastrointestinal: Negative for abdominal pain, diarrhea, nausea  and vomiting.  Genitourinary: Negative for difficulty urinating and dysuria.  Musculoskeletal: Negative for joint swelling.  Skin: Negative for color change and rash.  Neurological: Negative for dizziness, light-headedness and headaches.  Psychiatric/Behavioral: Negative for agitation and dysphoric mood.       Objective:    Physical Exam Constitutional:      General: She is not in acute distress.    Appearance: Normal appearance.  HENT:     Right Ear: External ear normal.     Left Ear: External ear normal.  Eyes:     General:        Right eye: No discharge.        Left eye: No discharge.     Conjunctiva/sclera: Conjunctivae normal.  Neck:     Musculoskeletal: Neck supple. No muscular tenderness.     Thyroid: No thyromegaly.  Cardiovascular:     Rate and Rhythm: Normal rate and regular rhythm.  Pulmonary:     Effort: No respiratory distress.     Breath sounds: Normal breath sounds. No wheezing.  Abdominal:     General: Bowel sounds are normal.     Palpations: Abdomen is soft.     Tenderness: There is no abdominal tenderness.  Musculoskeletal:        General: No swelling or tenderness.  Lymphadenopathy:     Cervical: No cervical adenopathy.  Skin:    Findings: No erythema or rash.  Neurological:     Mental Status: She is alert.  Psychiatric:        Mood and Affect: Mood normal.        Behavior: Behavior normal.     BP (!) 148/88    Pulse 96    Temp (!) 97.4 F (36.3 C) (Oral)    Resp 16    Wt 113 lb 9.6 oz (51.5 kg)    SpO2 97%    BMI 20.78 kg/m  Wt Readings from Last 3 Encounters:  12/04/18 113 lb 9.6 oz (51.5 kg)  08/03/18 113 lb 9.6 oz (51.5 kg)  05/07/18 112 lb (50.8 kg)     Lab Results  Component Value Date   WBC 5.2 12/04/2018   HGB 13.1 12/04/2018   HCT 39.7 12/04/2018   PLT 181.0 12/04/2018   GLUCOSE 86 12/04/2018   CHOL 177 08/12/2018   TRIG 90.0 08/12/2018   HDL 72.90 08/12/2018   LDLCALC 87 08/12/2018   ALT 10 12/04/2018  AST 17  12/04/2018   NA 143 12/04/2018   K 4.2 12/04/2018   CL 107 12/04/2018   CREATININE 0.88 12/04/2018   BUN 25 (H) 12/04/2018   CO2 29 12/04/2018   TSH 1.76 12/04/2018    Ct Chest Wo Contrast  Result Date: 11/18/2018 CLINICAL DATA:  Lung nodule follow-up. EXAM: CT CHEST WITHOUT CONTRAST TECHNIQUE: Multidetector CT imaging of the chest was performed following the standard protocol without IV contrast. COMPARISON:  CT chest dated 04/21/2017. FINDINGS: Cardiovascular: Coronary artery calcifications are noted. There is mild atherosclerotic disease involving the thoracic aorta. The heart size is normal. Mediastinum/Nodes: No enlarged mediastinal or axillary lymph nodes. Thyroid gland, trachea, and esophagus demonstrate no significant findings. Lungs/Pleura: There is a stable 1 cm pulmonary nodule in the left upper lobe axial series 3, image 31). There is a stable left lower lobe pulmonary nodule measuring approximately 7 mm (axial series 3, image 106). There is stable scarring in the right middle lobe. There is interval growth of a right middle lobe pulmonary nodule currently measuring 8 mm (previously measuring approximately 7 mm. There is an 11 mm pulmonary nodule in the right upper lobe, slightly increased in size from prior study when it measured 10 mm there is no pleural effusion or pneumothorax. The trachea is unremarkable. Upper Abdomen: Multiple small benign appearing liver cysts are noted. Otherwise, the upper abdomen is unremarkable. Musculoskeletal: Old compression fractures are again noted of the thoracic spine. There is no new acute displaced fracture or dislocation. There are old healed left-sided rib fractures. IMPRESSION: 1. Interval increase in size of two pulmonary nodules as detailed above, one in the right middle lobe and one in the right upper lobe. The largest now measures approximately 11 mm. The remaining pulmonary nodules are stable from prior study. Consider short interval follow-up  versus PET scan for further evaluation. 2. Stable chronic findings as above. Aortic Atherosclerosis (ICD10-I70.0). Electronically Signed   By: Constance Holster M.D.   On: 11/18/2018 20:23       Assessment & Plan:   Problem List Items Addressed This Visit    Essential hypertension, benign    Blood pressure doing well.  Continue current medication regimen.  Follow pressures.  Follow metabolic panel.        Relevant Orders   CBC with Differential/Platelet (Completed)   TSH (Completed)   Comprehensive metabolic panel (Completed)   Health care maintenance    Physical today 12/04/18.  Mammogram 12/11/17 - [facility] I.  Schedule f/u mammogram.   Colonoscopy 06/2012.        Loss of weight    Weight stable.  Follow.        Lung nodules    Followed by Dr Mortimer Fries.  F/u CT 11/18/18 - slight increased nodules - per report.  Will need f/u.  Pt moving. Discussed with her today.        Osteoporosis    Received reclast 01/2018.  Due f/u 01/2019.        Stress    Increased stress as outlined.  Still trying to cope with her husband's death.  Has good support.  Planning to move closer to her family.  Follow.         Other Visit Diagnoses    Breast cancer screening    -  Primary   Visit for screening mammogram       Relevant Orders   MM 3D SCREEN BREAST BILATERAL       Einar Pheasant, MD

## 2018-12-04 NOTE — Assessment & Plan Note (Addendum)
Physical today 12/04/18.  Mammogram 12/11/17 - [facility] I.  Schedule f/u mammogram.   Colonoscopy 06/2012.

## 2018-12-05 ENCOUNTER — Encounter: Payer: Self-pay | Admitting: Internal Medicine

## 2018-12-07 ENCOUNTER — Encounter: Payer: Self-pay | Admitting: Internal Medicine

## 2018-12-07 NOTE — Assessment & Plan Note (Signed)
Increased stress as outlined.  Still trying to cope with her husband's death.  Has good support.  Planning to move closer to her family.  Follow.

## 2018-12-07 NOTE — Assessment & Plan Note (Signed)
Blood pressure doing well.  Continue current medication regimen.  Follow pressures.  Follow metabolic panel.  

## 2018-12-07 NOTE — Assessment & Plan Note (Signed)
Followed by Dr Mortimer Fries.  F/u CT 11/18/18 - slight increased nodules - per report.  Will need f/u.  Pt moving. Discussed with her today.

## 2018-12-07 NOTE — Assessment & Plan Note (Signed)
Received reclast 01/2018.  Due f/u 01/2019.

## 2018-12-07 NOTE — Assessment & Plan Note (Signed)
Weight stable.  Follow.  

## 2018-12-15 ENCOUNTER — Encounter: Payer: Self-pay | Admitting: Internal Medicine

## 2018-12-15 ENCOUNTER — Telehealth: Payer: Self-pay

## 2018-12-15 NOTE — Telephone Encounter (Signed)
Patient's daughter, Lisa Blackwell called and said that patient would be moving to [place] the week of July 14.  Patient's daughter requested a paper copy of prescription for Evista and telmisartan.   Lisa Blackwell can be reached at [phone] when prescriptions are ready for pick up.

## 2018-12-16 ENCOUNTER — Other Ambulatory Visit: Payer: Self-pay

## 2018-12-16 MED ORDER — RALOXIFENE HCL 60 MG PO TABS: 60.0000 mg | ORAL_TABLET | Freq: Every day | ORAL | 1 refills | Status: AC

## 2018-12-16 MED ORDER — TELMISARTAN 40 MG PO TABS: 40.0000 mg | ORAL_TABLET | Freq: Every day | ORAL | 1 refills | Status: AC

## 2018-12-16 NOTE — Telephone Encounter (Signed)
Left message for daughter to call back.  

## 2018-12-16 NOTE — Telephone Encounter (Signed)
Printed rx for you to sign

## 2018-12-16 NOTE — Telephone Encounter (Signed)
rx's signed and placed in box.

## 2018-12-18 NOTE — Telephone Encounter (Signed)
Patients daughter aware they are ready. They will pick up on July 6 or 7

## 2018-12-24 ENCOUNTER — Other Ambulatory Visit: Payer: Self-pay | Admitting: Internal Medicine

## 2018-12-24 DIAGNOSIS — J4520 Mild intermittent asthma, uncomplicated: Secondary | ICD-10-CM

## 2018-12-24 NOTE — Telephone Encounter (Signed)
I spoke to patient's daughter, [name]. She would like 6 month of refills of Spiriva and Albuterol sent to [facility] in [place]. They will be leaving July 14th to take Ms. Gorniak to [place].   DK okay to send refills?

## 2018-12-28 MED ORDER — ALBUTEROL SULFATE HFA 108 (90 BASE) MCG/ACT IN AERS: INHALATION_SPRAY | RESPIRATORY_TRACT | 0 refills | Status: AC

## 2018-12-28 MED ORDER — SPIRIVA RESPIMAT 1.25 MCG/ACT IN AERS: 2.0000 | INHALATION_SPRAY | Freq: Two times a day (BID) | RESPIRATORY_TRACT | 0 refills | Status: AC

## 2018-12-28 NOTE — Telephone Encounter (Signed)
Refills sent to [facility] in [place] for 6 month supply of Spiriva and Albuterol.

## 2018-12-28 NOTE — Telephone Encounter (Signed)
Yes please

## 2019-01-08 ENCOUNTER — Telehealth: Payer: Self-pay

## 2019-01-08 NOTE — Telephone Encounter (Signed)
Copied from [place]. Topic: General - Inquiry >> Jan 08, 2019  1:33 PM Richardo Priest, [place] wrote: Reason for CRM: Patient's daughter is calling in asking for a PT evaluation be sent to the [facility]. Fax: [phone]. Call back is [phone].

## 2019-01-13 NOTE — Telephone Encounter (Signed)
Lisa Blackwell pt daughter is calling checking on the status of PT referral

## 2019-01-13 NOTE — Telephone Encounter (Signed)
Left message for [facility] to call me back

## 2019-01-13 NOTE — Telephone Encounter (Signed)
Spoke with Elta Guadeloupe at [facility]. He was under impression that pt was a longterm pt at the center. Advised that we were told that Dr Nicki Reaper will not be her MD anymore. If that is the case, orders for PT does not need to be given by our office. Elta Guadeloupe is going to reach out to daughter for clarification and call me back

## 2019-01-25 ENCOUNTER — Other Ambulatory Visit: Payer: Self-pay

## 2019-01-25 NOTE — Telephone Encounter (Signed)
Can you call and clarify.  According to the phone messages, she has moved and question seeing a new MD.  I do not mind ordering PT, but need to clarify if she is still a pt of mine.  Please call daughter and clarify.  I am just now seeing message.

## 2019-01-25 NOTE — Telephone Encounter (Signed)
Pt's daughter called requesting update on fax referral for PT eval. Please advise.  Best contact: Al Pimple [phone]

## 2019-01-26 NOTE — Telephone Encounter (Signed)
Letter for PT order printed and placed in box at [facility].

## 2019-01-26 NOTE — Telephone Encounter (Signed)
Family would like to know if would send order for PT until patient can be established with new provider at facility where family is moving, patient also due for Reclast infusion patient family will hold until, records can be attained and new provider established.

## 2019-01-27 NOTE — Telephone Encounter (Signed)
Letter for pt faxed to facility

## 2019-02-24 ENCOUNTER — Telehealth: Payer: Self-pay | Admitting: *Deleted

## 2019-02-24 DIAGNOSIS — E785 Hyperlipidemia, unspecified: Secondary | ICD-10-CM | POA: Diagnosis not present

## 2019-02-24 DIAGNOSIS — I10 Essential (primary) hypertension: Secondary | ICD-10-CM | POA: Diagnosis not present

## 2019-02-24 DIAGNOSIS — R2681 Unsteadiness on feet: Secondary | ICD-10-CM | POA: Diagnosis not present

## 2019-02-24 NOTE — Telephone Encounter (Signed)
Copied from [place] [id number]. Topic: General - Inquiry >> Feb 24, 2019 11:22 AM Richardo Priest, [place] wrote: Reason for CRM: Patient's daughter called in stating her mother is being seen at a new practice now, due to recently moving. New practitioner is requesting last office visit notes as well as the last labs that were done on the patient. Patient's new provider is Dr.Annie T. Thomas. Fax number is [phone], attention Dr.Thomas. Please advise.

## 2019-02-25 NOTE — Telephone Encounter (Signed)
Faxed to Dr Marcello Moores as requested

## 2019-03-03 DIAGNOSIS — R2681 Unsteadiness on feet: Secondary | ICD-10-CM | POA: Diagnosis not present

## 2019-03-03 DIAGNOSIS — R2689 Other abnormalities of gait and mobility: Secondary | ICD-10-CM | POA: Diagnosis not present

## 2019-03-16 DIAGNOSIS — Z23 Encounter for immunization: Secondary | ICD-10-CM | POA: Diagnosis not present

## 2019-03-19 DIAGNOSIS — R2689 Other abnormalities of gait and mobility: Secondary | ICD-10-CM | POA: Diagnosis not present

## 2019-03-19 DIAGNOSIS — R2681 Unsteadiness on feet: Secondary | ICD-10-CM | POA: Diagnosis not present

## 2019-03-31 DIAGNOSIS — H6123 Impacted cerumen, bilateral: Secondary | ICD-10-CM | POA: Diagnosis not present

## 2019-06-30 DIAGNOSIS — E785 Hyperlipidemia, unspecified: Secondary | ICD-10-CM | POA: Diagnosis not present

## 2019-06-30 DIAGNOSIS — H9190 Unspecified hearing loss, unspecified ear: Secondary | ICD-10-CM | POA: Diagnosis not present

## 2019-06-30 DIAGNOSIS — R5383 Other fatigue: Secondary | ICD-10-CM | POA: Diagnosis not present

## 2019-06-30 DIAGNOSIS — R918 Other nonspecific abnormal finding of lung field: Secondary | ICD-10-CM | POA: Diagnosis not present

## 2019-06-30 DIAGNOSIS — I10 Essential (primary) hypertension: Secondary | ICD-10-CM | POA: Diagnosis not present

## 2019-07-01 DIAGNOSIS — Z7189 Other specified counseling: Secondary | ICD-10-CM | POA: Diagnosis not present

## 2019-07-01 DIAGNOSIS — H903 Sensorineural hearing loss, bilateral: Secondary | ICD-10-CM | POA: Diagnosis not present

## 2019-07-24 DIAGNOSIS — Z23 Encounter for immunization: Secondary | ICD-10-CM | POA: Diagnosis not present

## 2019-08-19 DIAGNOSIS — E785 Hyperlipidemia, unspecified: Secondary | ICD-10-CM | POA: Diagnosis not present

## 2019-08-19 DIAGNOSIS — Z0000 Encounter for general adult medical examination without abnormal findings: Secondary | ICD-10-CM | POA: Diagnosis not present

## 2019-08-19 DIAGNOSIS — I10 Essential (primary) hypertension: Secondary | ICD-10-CM | POA: Diagnosis not present

## 2022-07-22 ENCOUNTER — Other Ambulatory Visit: Payer: Self-pay | Admitting: Physical Medicine & Rehabilitation

## 2022-07-22 DIAGNOSIS — S32000A Wedge compression fracture of unspecified lumbar vertebra, initial encounter for closed fracture: Secondary | ICD-10-CM

## 2022-07-24 ENCOUNTER — Ambulatory Visit
Admission: RE | Admit: 2022-07-24 | Discharge: 2022-07-24 | Disposition: A | Discharge status: Home / Self Care | Payer: Medicare Other | Source: Ambulatory Visit | Attending: Physical Medicine & Rehabilitation | Admitting: Physical Medicine & Rehabilitation

## 2022-07-24 DIAGNOSIS — S32000A Wedge compression fracture of unspecified lumbar vertebra, initial encounter for closed fracture: Secondary | ICD-10-CM

## 2022-08-04 ENCOUNTER — Emergency Department
Admission: EM | Admit: 2022-08-04 | Discharge: 2022-08-04 | Disposition: A | Discharge status: Home / Self Care | Payer: Medicare Other | Attending: Emergency Medicine | Admitting: Emergency Medicine

## 2022-08-04 DIAGNOSIS — K5903 Drug induced constipation: Secondary | ICD-10-CM

## 2022-08-04 DIAGNOSIS — K5641 Fecal impaction: Secondary | ICD-10-CM

## 2022-08-04 HISTORY — DX: Unspecified hearing loss, unspecified ear: H91.90

## 2022-08-04 HISTORY — DX: Essential (primary) hypertension: I10

## 2022-08-04 HISTORY — DX: Age-related osteoporosis without current pathological fracture: M81.0

## 2022-08-04 MED ORDER — FLEET ENEMA 7-19 GM/118ML RE ENEM
1.0000 | ENEMA | Freq: Once | RECTAL | Status: AC
Start: 2022-08-04 — End: 2022-08-04
  Administered 2022-08-04: 1 via RECTAL
  Filled 2022-08-04: qty 1

## 2022-08-04 MED ORDER — GLYCERIN 2 G RE SUPP (ADULT)
1.0000 | Freq: Every day | RECTAL | 0 refills | Status: AC | PRN
Start: 2022-08-04 — End: ?

## 2022-08-04 NOTE — ED Provider Notes (Addendum)
EMERGENCY DEPARTMENT HISTORY AND PHYSICAL EXAM     None      Date: 08/04/2022  Patient Name: [name]    History of Presenting Illness     History Provided By: Patient    Additional History: [name] is a 89 y.o. female presenting to the ED with concern for constipation x3 weeks. Has been on tyl 3 for chronic pain, recently stopped it a few days ago. Pt presenting with her daughter. Has been having liquid stool and has been straining but feels there may be something stuck in there. No abd pain or pelvic pain. No urinary sxs. No bloating/distension.     Review of Systems (see HPI above).    PCP: Melonie Florida, MD  SPECIALISTS:  Past History     Past Medical History:  Past Medical History:   Diagnosis Date    HOH (hard of hearing)     Hypertension     Osteoporosis        Social History:  Social History     Tobacco Use    Smoking status: Unknown   Substance Use Topics    Alcohol use: Never    Drug use: Never       Social Determinants of Health     Tobacco Use: Not on file   Alcohol Use: Not on file   Financial Resource Strain: Not on file   Food Insecurity: Not on file   Transportation Needs: Not on file   Physical Activity: Not on file   Stress: Not on file   Social Connections: Not on file   Intimate Partner Violence: Not At Risk (08/04/2022)    Humiliation, Afraid, Rape, and Kick questionnaire     Fear of Current or Ex-Partner: No     Emotionally Abused: No     Physically Abused: No     Sexually Abused: No   Depression: Not on file   Housing Stability: Not on file   Utilities: Not on file       No current facility-administered medications for this encounter.     Current Outpatient Medications   Medication Sig Dispense Refill    aspirin EC 81 MG EC tablet Take 1 tablet (81 mg) by mouth daily      raloxifene (EVISTA) 60 MG tablet Take 1 tablet (60 mg) by mouth daily      telmisartan (MICARDIS) 80 MG tablet Take 0.5 tablets (40 mg) by mouth daily      vitamin D (CHOLECALCIFEROL) 25 MCG (1000 UT)  tablet Take 1 tablet (1,000 Units) by mouth daily      Glycerin, Laxative, (glycerin, ADULT,) 2 g Suppos Place 1 suppository rectally daily as needed (constipation) 25 suppository 0       I have reviewed and confirmed Past Medical History, Surgical History, Family History and Social hx as documented. Also reviewed current medication history per nursing notes.     Physical Exam   Vital Signs-Reviewed the patient's vital signs.  Vitals:    08/04/22 0746   BP: 182/79   Pulse: 69   Resp: 18   Temp: 99.2 F (37.3 C)   TempSrc: Oral   SpO2: 96%   Weight: 47.6 kg   Height: '5\' 2"'$  (1.575 m)     Pulse Oximetry Analysis - SpO2: 96 % on RA    Constitutional: Alert, hard of hearing (baseline) elderly female  Head: Normocephalic and atraumatic.   Mouth/Throat: Oropharynx is clear and moist.   Eyes: Conjunctivae normal  and EOM are normal. Pupils are equal and round.   Neck: Normal range of motion. Trachea midline.  Cardiovascular: Normal rate, regular rhythm  Abdominal: Soft. Non distended. Non tender. No rebound or guarding  Neurological: Patient is alert and oriented   Skin: Skin is warm and dry. No rash  Rectal: hard impacted stool in rectal vault, no blood    Medical Decision Making       Procedures:  --------------------------- PROCEDURE: rectal disimpaction  -------------------------    Performed by the emergency provider  Time: 0830  Consent:  Informed consent, after discussion of the risks, benefits, and alternatives to the procedure was obtained.    Timeout: A timeout to verify the correct patient, procedure, and site was performed.   Procedure Site: rectum  Indication: constipation, impacted stool   Procedure: small hard stool manually removed from rectal vault, no blood; rest of stool lodged behind soft and able to be removed by pt by bearing down after enema  Post-procedure: The patient tolerated the procedure well with no immediate complications.      ED course/Patient Updates:       Medication given in the ED below  with improvement:   ED Medication Orders (From admission, onward)      Start Ordered     Status Ordering Provider    08/04/22 [phone] 08/04/22 0834  sodium phosphate (FLEET) enema 1 enema  Once        Route: Rectal  Ordered Dose: 1 enema       Last MAR action: Given EL-MAGBRI, [name]            Provider Notes:   Pt presenting with constipation x3 weeks in setting of recent opioids for chronic pain. These have been stopped a few days ago. Pt has been using oral ducolax but only has at 1-2 doses. Pt referred to the ED by physician for manual disimpaction/evaluation. Pt with benign abd exam, low concern for bowel obstruction, imaging and workup not indicated.  Procedure performed by myself, see note above. Pt tolerated well and successfully able to stool after disimpaction followed by enema. Advised oral hydration, prune/able juice, fiber diet, and given recommendations for laxatives/stool softeners. Will also prescribe rescue glycerin supp prn.   Will f/u with pcp as needed. Return precautions discussed and [place] instructions given.    Clinical Impression:     1. Impacted stool in rectum    2. Drug-induced constipation        ED Disposition       ED Disposition   Discharge from ED Observation    Condition   --    Date/Time   Sun Aug 04, 2022  9:58 AM    Comment   --                   _______________________________    CHART OWNERSHIP: Dr. Henderson Cloud is the primary emergency physician of record.    Attestations: This note is prepared by Henderson Cloud, MD.  This note was generated by the Epic EMR system/ Dragon speech recognition and may contain inherent errors or omissions not intended by the user. Grammatical errors, random word insertions, deletions and pronoun errors  are occasional consequences of this technology due to software limitations. Not all errors are caught or corrected. If there are questions or concerns about the content of this note or information contained within the body of this dictation they  should be addressed directly with the author for clarification  _______________________________  Delphina Cahill, MD  08/04/22 [id number]       Delphina Cahill, MD  08/04/22 [id number]       Delphina Cahill, MD  08/04/22 [id number]       Delphina Cahill, MD  08/04/22 [phone]

## 2022-08-04 NOTE — ED Triage Notes (Signed)
Pt having chronic pain in the back, given tylenol with codeine  but not stool softener; now has been constipated.  Yesterday pt's primary care instructed pt to take 1 dose of dulcolax, with minimal results.  When called back 12 hrs later, he said to take her to the ER, instead of giving a second dose.  Pt has been passing minimal gas.

## 2023-01-11 ENCOUNTER — Emergency Department: Payer: Medicare Other

## 2023-01-11 ENCOUNTER — Inpatient Hospital Stay: Payer: Medicare Other

## 2023-01-11 ENCOUNTER — Inpatient Hospital Stay
Admission: EM | Admit: 2023-01-11 | Discharge: 2023-01-14 | DRG: 481 | Disposition: A | Discharge status: Skilled Nursing Facility | Payer: Medicare Other | Attending: Orthopaedic Trauma | Admitting: Orthopaedic Trauma

## 2023-01-11 DIAGNOSIS — E222 Syndrome of inappropriate secretion of antidiuretic hormone: Secondary | ICD-10-CM | POA: Diagnosis not present

## 2023-01-11 DIAGNOSIS — R636 Underweight: Secondary | ICD-10-CM | POA: Diagnosis present

## 2023-01-11 DIAGNOSIS — M80051A Age-related osteoporosis with current pathological fracture, right femur, initial encounter for fracture: Principal | ICD-10-CM | POA: Diagnosis present

## 2023-01-11 DIAGNOSIS — Z681 Body mass index (BMI) 19 or less, adult: Secondary | ICD-10-CM

## 2023-01-11 DIAGNOSIS — F0390 Unspecified dementia without behavioral disturbance: Secondary | ICD-10-CM | POA: Diagnosis present

## 2023-01-11 DIAGNOSIS — S72044A Nondisplaced fracture of base of neck of right femur, initial encounter for closed fracture: Principal | ICD-10-CM | POA: Diagnosis present

## 2023-01-11 DIAGNOSIS — W010XXA Fall on same level from slipping, tripping and stumbling without subsequent striking against object, initial encounter: Secondary | ICD-10-CM | POA: Diagnosis present

## 2023-01-11 DIAGNOSIS — Z79899 Other long term (current) drug therapy: Secondary | ICD-10-CM

## 2023-01-11 DIAGNOSIS — I10 Essential (primary) hypertension: Secondary | ICD-10-CM | POA: Diagnosis present

## 2023-01-11 DIAGNOSIS — S72009A Fracture of unspecified part of neck of unspecified femur, initial encounter for closed fracture: Secondary | ICD-10-CM

## 2023-01-11 DIAGNOSIS — R079 Chest pain, unspecified: Secondary | ICD-10-CM

## 2023-01-11 DIAGNOSIS — D62 Acute posthemorrhagic anemia: Secondary | ICD-10-CM | POA: Diagnosis not present

## 2023-01-11 LAB — LAB USE ONLY - CBC WITH DIFFERENTIAL
Absolute Basophils: 0.02 10*3/uL (ref 0.00–0.08)
Absolute Eosinophils: 0.03 10*3/uL (ref 0.00–0.44)
Absolute Immature Granulocytes: 0.03 10*3/uL (ref 0.00–0.07)
Absolute Lymphocytes: 1.09 10*3/uL (ref 0.42–3.22)
Absolute Monocytes: 0.51 10*3/uL (ref 0.21–0.85)
Absolute Neutrophils: 4.57 10*3/uL (ref 1.10–6.33)
Absolute nRBC: 0 10*3/uL (ref <=0.00)
Basophils %: 0.3 %
Eosinophils %: 0.5 %
Hematocrit: 35 % (ref 34.7–43.7)
Immature Granulocytes %: 0.5 %
Lymphocytes %: 17.4 %
MCV: 97.9 fL — ABNORMAL HIGH (ref 78.0–96.0)
MPV: 10.8 fL (ref 8.9–12.5)
Monocytes %: 8.2 %
Neutrophils %: 73.1 %
Platelet Count: 172 10*3/uL (ref 142–346)
Preliminary Absolute Neutrophil Count: 4.57 10*3/uL (ref 1.10–6.33)
RBC: 2.83 10*6/uL — ABNORMAL LOW (ref 3.90–5.10)
RDW: 16 % — ABNORMAL HIGH (ref 11–15)
WBC: 6.25 10*3/uL (ref 3.10–9.50)
nRBC %: 0 /100 WBC (ref <=0.0)

## 2023-01-11 LAB — COMPREHENSIVE METABOLIC PANEL
ALT: 14 U/L (ref 0–55)
AST (SGOT): 21 U/L (ref 5–41)
Albumin/Globulin Ratio: 1.1 (ref 0.9–2.2)
Albumin: 3 g/dL — ABNORMAL LOW (ref 3.5–5.0)
Alkaline Phosphatase: 95 U/L (ref 37–117)
Anion Gap: 7 (ref 5.0–15.0)
BUN: 23 mg/dL — ABNORMAL HIGH (ref 7–21)
Bilirubin, Total: 0.7 mg/dL (ref 0.2–1.2)
CO2: 24 mEq/L (ref 17–29)
Calcium: 8.8 mg/dL (ref 7.9–10.2)
Chloride: 107 mEq/L (ref 99–111)
Creatinine: 0.7 mg/dL (ref 0.4–1.0)
GFR: >60 mL/min/{1.73_m2} (ref >=60.0)
Globulin: 2.7 g/dL (ref 2.0–3.6)
Glucose: 94 mg/dL (ref 70–100)
Potassium: 4 mEq/L (ref 3.5–5.3)
Protein, Total: 5.7 g/dL — ABNORMAL LOW (ref 6.0–8.3)
Sodium: 138 mEq/L (ref 135–145)

## 2023-01-11 LAB — PT/INR
INR: 1.1 (ref 0.9–1.1)
PT: 12 s (ref 10.1–12.9)

## 2023-01-11 LAB — ECG 12-LEAD
P-R Interval: 152 ms
R Axis: -31 degrees
T Axis: 18 degrees
Ventricular Rate: 77 {beats}/min

## 2023-01-11 MED ORDER — OXYCODONE HCL 5 MG PO TABS
2.5000 mg | ORAL_TABLET | ORAL | Status: DC | PRN
Start: 2023-01-11 — End: 2023-01-12

## 2023-01-11 MED ORDER — OXYCODONE HCL 5 MG PO TABS
2.5000 mg | ORAL_TABLET | ORAL | Status: DC | PRN
Start: 2023-01-11 — End: 2023-01-14
  Administered 2023-01-12 – 2023-01-14 (×2): 2.5 mg via ORAL
  Filled 2023-01-11 (×2): qty 1

## 2023-01-11 MED ORDER — VALSARTAN 160 MG PO TABS
160.0000 mg | ORAL_TABLET | Freq: Every day | ORAL | Status: DC
Start: 2023-01-11 — End: 2023-01-14
  Administered 2023-01-11 – 2023-01-14 (×4): 160 mg via ORAL
  Filled 2023-01-11 (×4): qty 1

## 2023-01-11 MED ORDER — ACETAMINOPHEN 500 MG PO TABS
1000.0000 mg | ORAL_TABLET | Freq: Three times a day (TID) | ORAL | Status: DC
Start: 2023-01-11 — End: 2023-01-12

## 2023-01-11 MED ORDER — OXYCODONE HCL 5 MG PO TABS
5.0000 mg | ORAL_TABLET | ORAL | Status: DC | PRN
Start: 2023-01-11 — End: 2023-01-12

## 2023-01-11 MED ORDER — STERILE WATER FOR INJECTION IJ/IV SOLN (WRAP)
2.0000 g | INTRAMUSCULAR | Status: AC
Start: 2023-01-11 — End: 2023-01-12
  Administered 2023-01-12: 2 g via INTRAVENOUS

## 2023-01-11 MED ORDER — KETOROLAC TROMETHAMINE 30 MG/ML IJ SOLN
15.0000 mg | Freq: Once | INTRAMUSCULAR | Status: AC
Start: 2023-01-11 — End: 2023-01-11
  Administered 2023-01-11: 15 mg via INTRAVENOUS
  Filled 2023-01-11: qty 1

## 2023-01-11 MED ORDER — HYDRALAZINE HCL 20 MG/ML IJ SOLN
5.0000 mg | Freq: Four times a day (QID) | INTRAMUSCULAR | Status: DC | PRN
Start: 2023-01-11 — End: 2023-01-14
  Administered 2023-01-12 – 2023-01-14 (×2): 5 mg via INTRAVENOUS
  Filled 2023-01-11 (×2): qty 1

## 2023-01-11 MED ORDER — PLASMA-LYTE A IV INFUSION
INTRAVENOUS | Status: DC
Start: 2023-01-11 — End: 2023-01-11

## 2023-01-11 MED ORDER — ENOXAPARIN SODIUM 40 MG/0.4ML IJ SOSY
40.0000 mg | PREFILLED_SYRINGE | INTRAMUSCULAR | Status: DC
Start: 2023-01-11 — End: 2023-01-14
  Administered 2023-01-13 – 2023-01-14 (×2): 40 mg via SUBCUTANEOUS
  Filled 2023-01-11 (×3): qty 0.4

## 2023-01-11 MED ORDER — POLYETHYLENE GLYCOL 3350 17 G PO PACK
17.0000 g | PACK | Freq: Every day | ORAL | Status: DC
Start: 2023-01-11 — End: 2023-01-12

## 2023-01-11 MED ORDER — ACETAMINOPHEN 325 MG PO TABS
650.0000 mg | ORAL_TABLET | Freq: Four times a day (QID) | ORAL | Status: DC
Start: 2023-01-11 — End: 2023-01-12
  Administered 2023-01-11 – 2023-01-12 (×4): 650 mg via ORAL
  Filled 2023-01-11 (×4): qty 2

## 2023-01-11 MED ORDER — PLASMA-LYTE A IV INFUSION
INTRAVENOUS | Status: DC
Start: 2023-01-11 — End: 2023-01-12

## 2023-01-11 NOTE — Plan of Care (Signed)
Problem: Pain interferes with ability to perform ADL  Goal: Pain at adequate level as identified by patient  Flowsheets (Taken 01/11/2023 [phone])  Pain at adequate level as identified by patient:   Identify patient comfort function goal   Reassess pain within 30-60 minutes of any procedure/intervention, per Pain Assessment, Intervention, Reassessment (AIR) Cycle   Evaluate if patient comfort function goal is met     Problem: Side Effects from Pain Analgesia  Goal: Patient will experience minimal side effects of analgesic therapy  Flowsheets (Taken 01/11/2023 [phone])  Patient will experience minimal side effects of analgesic therapy:   Monitor/assess patient's respiratory status (RR depth, effort, breath sounds)   Assess for changes in cognitive function   Evaluate for opioid-induced sedation with appropriate assessment tool (i.e. POSS)     Problem: Moderate/High Fall Risk Score >5  Goal: Patient will remain free of falls  Flowsheets (Taken 01/11/[phone])  High (Greater than 13):   HIGH-Visual cue at entrance to patient's room   HIGH-Consider use of low bed   HIGH-Bed alarm on at all times while patient in bed   HIGH-Apply yellow "Fall Risk" arm band   HIGH-Initiate use of floor mats as appropriate

## 2023-01-11 NOTE — ED Provider Notes (Addendum)
[name] EMERGENCY DEPARTMENT H&P      Visit date: 01/11/2023      CLINICAL SUMMARY          Diagnosis:    .     Final diagnoses:   Closed nondisplaced basicervical fracture of right femur, initial encounter         MDM Notes:      I am the first provider for this patient.  I reviewed the vital signs, nursing notes, past medical history, past surgical history, family history and social history.  I have reviewed the patient's previous charts.    Medical Decision Making  Amount and/or Complexity of Data Reviewed  Labs: ordered.  Radiology: ordered.  ECG/medicine tests: ordered.    Risk  Prescription drug management.  Decision regarding hospitalization.      PMP and available external data reviewed.          Disposition:         Inpatient Admit      ED Disposition       ED Disposition   Admit    Condition   --    Date/Time   Sat Jan 11, 2023  4:05 AM    Comment   Admitting Physician: Smith Mince [[id number]]   Service:: Ortho/ Spine [[id number]]   Estimated Length of Stay: > or = to 2 midnights   Tentative Discharge Plan?: Home or Self Care [1]   Does patient need telemetry?: No   Was admission to another facility considered?: No   Detail:: [facility] resource required                CASE ACUITY SUMMARY                        CLINICAL INFORMATION        HPI:      Chief Complaint: Fall and Hip Pain  .    [name] is a 89 y.o. female with hx of dementia, osteoporosis, and HTN presenting to the ED after an unwitnessed fall at an assisted living home today. Per EMS report, patient was going to the bathroom when she tripped. She was found complaining of R hip pain and is thus brought to the ED for evaluation.   Unable to obtain complete HPI, PMFSH, and ROS due to patient is a poor historian.    History obtained from: patient, EMS    Nursing (triage) note reviewed for the following pertinent information:         ROS:      Positive and Negative ROS elements as per HPI  All Other Systems Reviewed and Negative: Yes       Physical Exam:      Pulse 66  BP (!) 214/97  Resp 20  SpO2 96 %  Temp 97.6 F (36.4 C)    Physical Exam   Nursing note and vitals reviewed.  Constitutional: Pt is oriented only to name. Pt appears well-developed and well-nourished. No distress.   HEENT:   Head: Normocephalic and atraumatic.   Right Ear: External ear normal.   Left Ear: External ear normal.   Nose: Nose normal.   Mouth/Throat: Oropharynx is clear and moist. No oropharyngeal exudate.   Eyes: Conjunctivae and EOM are normal. Pupils are equal, round, and reactive to light. Right eye exhibits no discharge. Left eye exhibits no discharge. No scleral icterus.   Neck: Normal range of motion. Neck supple. No JVD present. No tracheal  deviation present.   Pulmonary/Chest: Effort normal and breath sounds normal. No stridor. No respiratory distress. Pt has no wheezes. Pt exhibits no tenderness. 1+ BL   Abdominal: There is no tenderness. Soft. Pt exhibits no distension and no mass.  There is no rebound and no guarding.   Musculoskeletal:  No calf tenderness or edema. Distal pulses intact. FROM hip. No pain  Neurological:  No cranial nerve deficit. Pt exhibits normal muscle tone. Coordination normal. MAE.  Skin: Skin is warm and dry. Pt is not diaphoretic.   Psychiatric: Pt has a normal mood and affect. Behavior is normal. Judgment and thought content normal            PAST HISTORY        Primary Care Provider: Hope Budds, MD        PMH/PSH:    .     Medical History[1]    She has a past surgical history that includes CLOSED REDUCTION, HIP, CANNULATED SCREWS (Right, 01/12/2023).      Social/Family History:      She reports that she does not drink alcohol and does not use drugs. No history on file for tobacco use.    History reviewed. No pertinent family history.      Listed Medications on Arrival:    .     Home Medications       Med List Status: In Progress Set By: Rosezetta Schlatter, RN at 01/11/2023  1:34 AM              Glycerin, Laxative, (glycerin,  ADULT,) 2 g Suppos     Place 1 suppository rectally daily as needed (constipation)     raloxifene (EVISTA) 60 MG tablet     Take 1 tablet (60 mg) by mouth daily     telmisartan (MICARDIS) 80 MG tablet     Take 0.5 tablets (40 mg) by mouth daily     vitamin D (CHOLECALCIFEROL) 25 MCG (1000 UT) tablet     Take 1 tablet (1,000 Units) by mouth daily               Allergies: She has No Known Allergies.            VISIT INFORMATION        Clinical Course in the ED:      ED Course as of 01/17/23 6213   Sat Jan 11, 2023   0258 Discussed with daughter  [MS]   0303 Discussed with ortho resident  [MS]      ED Course User Index  [MS] Esmond Harps   Discussed with hospitalist and they will consult      Medications Given in the ED:    .     ED Medication Orders (From admission, onward)      Start Ordered     Status Ordering Provider    01/11/23 0252 01/11/23 0251  ketorolac (TORADOL) injection 15 mg  Once        Route: Intravenous  Ordered Dose: 15 mg       Last MAR action: Given Shakeda Pearse A    Signed and Held Signed and Held    Every 4 hours PRN        Route: Oral  Ordered Dose: 5 mg        RAMIREZ, VALENTINA              Procedures:      Procedures      Interpretations:  Pulse ox reviewed by me. All ordered labs and images reviewed by me.     O2 sat-           saturation: 96 %; Oxygen use: room air; Interpretation: Normal     EKG independently interpreted by me: NSR @ 77    NL intervals. NO ischemia. No blocks. No STEMI                RESULTS        Lab Results:      Results       Procedure Component Value Units Date/Time    CBC with Differential (Order) [[id number]]  (Abnormal) Collected: 01/14/23 0357    Specimen: Blood, Venous Updated: 01/14/23 0829    Narrative:      The following orders were created for panel order CBC with Differential (Order).  Procedure                               Abnormality         Status                     ---------                               -----------         ------                      CBC with Differential (C.Marland KitchenMarland Kitchen[[id number]]  Abnormal            Final result                 Please view results for these tests on the individual orders.    CBC with Differential (Component) [[id number]]  (Abnormal) Collected: 01/14/23 0357    Specimen: Blood, Venous Updated: 01/14/23 0829     WBC 9.00 x10 3/uL      Hemoglobin 9.6 g/dL      Hematocrit 10.2 %      Platelet Count 208 x10 3/uL      MPV 10.2 fL      RBC 3.22 x10 6/uL      MCV 91.3 fL      MCH 29.8 pg      MCHC 32.7 g/dL      RDW 14 %      nRBC % 0.0 /100 WBC      Absolute nRBC 0.00 x10 3/uL      Preliminary Absolute Neutrophil Count 6.63 x10 3/uL      Neutrophils % 73.7 %      Lymphocytes % 16.0 %      Monocytes % 8.3 %      Eosinophils % 1.1 %      Basophils % 0.3 %      Immature Granulocytes % 0.6 %      Absolute Neutrophils 6.63 x10 3/uL      Absolute Lymphocytes 1.44 x10 3/uL      Absolute Monocytes 0.75 x10 3/uL      Absolute Eosinophils 0.10 x10 3/uL      Absolute Basophils 0.03 x10 3/uL      Absolute Immature Granulocytes 0.05 x10 3/uL     Basic Metabolic Panel [[id number]]  (Abnormal) Collected: 01/14/23 0357    Specimen: Blood, Venous Updated: 01/14/23 0543     Glucose 115 mg/dL  BUN 27 mg/dL      Creatinine 0.8 mg/dL      Calcium 9.1 mg/dL      Sodium 956 mEq/L      Potassium 4.2 mEq/L      Chloride 104 mEq/L      CO2 26 mEq/L      Anion Gap 7.0     GFR >60.0 mL/min/1.73 m2     Hemoglobin and Hematocrit [[id number]]  (Abnormal) Collected: 01/13/23 1720    Specimen: Blood, Venous Updated: 01/13/23 1801     Hemoglobin 10.1 g/dL      Hematocrit 95.1 %                 Radiology Results:      Fluoroscopy less than 1 hour   Final Result       Fluoroscopic guidance provided without the presence of a radiologist.      Gerlene Burdock, MD   01/12/2023 5:46 PM      US Venous Low Extrem Duplx Dopp Comp Bilat   Final Result       No sonographic evidence for right or left lower extremity deep venous   thrombosis.      Marilu Favre, MD   01/11/2023 1:08 PM       XR Hip right 2-3 vw with pelvis   Final Result       1.No acute fractures detected on radiographs. Diffuse osteopenia. Given the   CT report findings, recommend further evaluation with bony pelvic MRI.      Emeline Darling, MD   01/11/2023 12:48 PM      CT Pelvis without Contrast (Boney Pelvis)   Final Result         1.Osseous demineralization lowering sensitivity for evaluation of   nondisplaced fractures. Within confines of limitations, subtle lucency is   seen within the right femoral neck worrisome for a nondisplaced fracture.   MRI is recommended.   2.Limited evaluation of the left hip secondary to streak artifact and   motion. No obvious fractures are identified. However, an asymmetric soft   tissue contusion is seen in the left flank. If the patient has left hip   pain, consider MRI of the left hip as well.      Urgent results were discussed with and acknowledged by Idell Pickles,   MD on 01/11/2023 2:53 AM.      Campbell Stall   01/11/2023 2:54 AM      CT Head without Contrast   Final Result       No evidence of acute intracranial abnormality.      Susy Frizzle, MD   01/11/2023 2:53 AM                  Scribe Attestation:      I was acting as a Neurosurgeon for Idell Pickles, MD on Elbe,Montina R   Treatment Team: Scribe: Esmond Harps     I am the first provider for this patient and I personally performed the services documented. Treatment Team: Scribe: Esmond Harps is scribing for me on Rudzinski,Tarina R.This note and the patient instructions accurately reflect work and decisions made by me.  Idell Pickles, MD             Idell Pickles, MD  01/11/23 [phone]         [1]   Past Medical History:  Diagnosis Date    HOH (hard of hearing)     Hypertension  Osteoporosis         Idell Pickles, MD  01/17/23 [phone]

## 2023-01-11 NOTE — Plan of Care (Signed)
Problem: Pain interferes with ability to perform ADL  Goal: Pain at adequate level as identified by patient  Outcome: Progressing  Flowsheets (Taken 01/11/2023 0924)  Pain at adequate level as identified by patient:   Identify patient comfort function goal   Assess for risk of opioid induced respiratory depression, including snoring/sleep apnea. Alert healthcare team of risk factors identified.   Assess pain on admission, during daily assessment and/or before any "as needed" intervention(s)   Evaluate if patient comfort function goal is met   Offer non-pharmacological pain management interventions   Reassess pain within 30-60 minutes of any procedure/intervention, per Pain Assessment, Intervention, Reassessment (AIR) Cycle     Problem: Side Effects from Pain Analgesia  Goal: Patient will experience minimal side effects of analgesic therapy  Outcome: Progressing  Flowsheets (Taken 01/11/2023 0924)  Patient will experience minimal side effects of analgesic therapy: Assess for changes in cognitive function     Problem: Moderate/High Fall Risk Score >5  Goal: Patient will remain free of falls  Outcome: Progressing

## 2023-01-11 NOTE — Consults (Addendum)
MEDICINE NEW CONSULT    Date Time: 01/11/23 3:53 AM  Patient Name: [name]  Requesting Physician: Burman Foster, MD  Consulting Physician: Denice Paradise, MD    Primary Care Physician: Hope Budds, MD    Reason for Consultation: Preop clearance and medical comanagement      Assessment / Recommendations:     89 year old female with history of dementia, osteoporosis and hypertension admitted post unwitnessed fall from assisted living with nondisplaced right hip fracture.    # Unwitnessed fall.  Right hip fracture.  History of osteoporosis.  -N.p.o. after midnight.  Anticipate surgery planned in the morning.  DVT prophylaxis.  Pain control.  Postop PT OT per primary team.  Pain control    Preoperative stratification: No history of MI/CHF in the past 30 days.  Patient does not have a history of CAD/CHF/CKD/IDDM/CVA.  Has baseline dementia and hard on hearing.  METS ~1.  Uses walker at baseline and mostly sedentary.  Needs assistance with ADLs.  EKG reviewed with normal sinus rhythm.  Exam with euvolemic status, no significant murmur appreciated.  Can proceed with surgery.  Patient is moderate risk for low risk surgery.  Benefit of surgery outweighs the risk.    Delirium precaution.  Frequent orientation.    # Hypertension.  Monitor blood pressure.  Resume ACE postoperatively as appropriate.           thank you for this consultation.  We will follow the patient with you during this hospitalization.  Please contact me with any questions or issues.    Additional Diagnoses:     Patient has BMI=There is no height or weight on file to calculate BMI.  Diagnosis: Underweight                History of Presenting Illness:   [name] is a 89 y.o. female who presents to the hospital with unwitnessed fall with right-sided hip pain and not able to do weightbearing.  Patient has a past medical history of dementia, osteoporosis, hypertension who was brought in to the ED by EMS while going to the bathroom  tripped and fell on the right side.  Patient immediately complained of right-sided hip pain and was brought to the emergency department for evaluation.  Patient denied any loss of consciousness, hitting head numbness or tingling.    Review of the system is negative for high-grade fever, chills, shortness of breath, chest pain, nausea, vomiting, abdominal pain, dysuria or changes in bowel habit.    Lab work notable for normal kidney function with imaging showing right hip nondisplaced fracture.  Admitted to orthopedic service for surgical evaluation.    Past Medical History:     Past Medical History:   Diagnosis Date    HOH (hard of hearing)     Hypertension     Osteoporosis        Available old records reviewed, including:  EMR    Past Surgical History:   History reviewed. No pertinent surgical history.    Family History:   History reviewed. No pertinent family history.    Social History:   Tobacco Use History[1]  Social History     Substance and Sexual Activity   Alcohol Use Never     Social History     Substance and Sexual Activity   Drug Use Never       Allergies:   Allergies[2]    Medications:     Home Medications  Med List Status: In Progress Set By: Rosezetta Schlatter, RN at 01/11/2023  1:34 AM              aspirin EC 81 MG EC tablet     Take 1 tablet (81 mg) by mouth daily     Glycerin, Laxative, (glycerin, ADULT,) 2 g Suppos     Place 1 suppository rectally daily as needed (constipation)     raloxifene (EVISTA) 60 MG tablet     Take 1 tablet (60 mg) by mouth daily     telmisartan (MICARDIS) 80 MG tablet     Take 0.5 tablets (40 mg) by mouth daily     vitamin D (CHOLECALCIFEROL) 25 MCG (1000 UT) tablet     Take 1 tablet (1,000 Units) by mouth daily                     Review of Systems:   All other systems were reviewed and are negative except HPI    Physical Exam:   Patient Vitals for the past 24 hrs:   BP Temp Temp src Pulse Resp SpO2   01/11/23 0230 160/72 97.5 F (36.4 C) Oral 66 18 94 %   01/11/23 0135  151/76 -- -- 65 18 93 %   01/11/23 0030 187/77 -- -- 60 19 97 %   01/11/23 0020 (!) 214/97 97.6 F (36.4 C) Temporal 66 20 96 %     There is no height or weight on file to calculate BMI.  No intake or output data in the 24 hours ending 01/11/23 0353    General: awake, alert, oriented x 2; no acute distress.  HEENT: perrla, eomi, sclera anicteric  oropharynx clear without lesions, mucous membranes moist  Neck: supple, no lymphadenopathy, no thyromegaly, no JVD, no carotid bruits  Cardiovascular: regular rate and rhythm, no murmurs, rubs or gallops  Lungs: clear to auscultation bilaterally, without wheezing, rhonchi, or rales  Abdomen: soft, non-tender, non-distended; no palpable masses, no hepatosplenomegaly, normoactive bowel sounds, no rebound or guarding  Extremities: no clubbing, cyanosis, or edema  Neuro: A+O x 3, cranial nerves grossly intact,  sensation intact,   Skin: no rashes or lesions noted  Other:       Labs:   No results for input(s): "WBC", "HGB", "HCT", "PLT" in the last 72 hours.    No results for input(s): "NA", "K", "CL", "CO2", "BUN", "CREAT", "GLU", "CA", "MG", "PHOS" in the last 72 hours.    No results for input(s): "AST", "ALT", "ALKPHOS", "PROT", "ALB" in the last 72 hours.    No results for input(s): "PTT", "PT", "INR" in the last 72 hours.    Imaging personally reviewed, including: CT Pelvis without Contrast (Boney Pelvis)    Result Date: 01/11/2023  1.Osseous demineralization lowering sensitivity for evaluation of nondisplaced fractures. Within confines of limitations, subtle lucency is seen within the right femoral neck worrisome for a nondisplaced fracture. MRI is recommended. 2.Limited evaluation of the left hip secondary to streak artifact and motion. No obvious fractures are identified. However, an asymmetric soft tissue contusion is seen in the left flank. If the patient has left hip pain, consider MRI of the left hip as well. Urgent results were discussed with and acknowledged by  Idell Pickles, MD on 01/11/2023 2:53 AM. Campbell Stall 01/11/2023 2:54 AM    CT Head without Contrast    Result Date: 01/11/2023   No evidence of acute intracranial abnormality. Susy Frizzle, MD 01/11/2023 2:53 AM  Signed by: Denice Paradise, MD    cc: Burman Foster, MD  Hope Budds, MD         [1]   Social History  Tobacco Use   Smoking Status Unknown   Smokeless Tobacco Not on file   [2] No Known Allergies

## 2023-01-11 NOTE — Nursing Progress Note (Signed)
Medicine Shift Note    Braden Scale Score: 17 (01/11/23 0900)  Skin Assessment: Bruising, Blanchable Redness  Bruising Skin Location: Scattered  Patient Lines/Drains/Airways Status       Active Lines, Drains and Airways       Name Placement date Placement time Site Days    Peripheral IV 01/11/23 20 G Diffusion Left Antecubital 01/11/23  0257  Antecubital  less than 1                      Skin  4 eyes in 4 hours pressure injury assessment note:      Completed with:            Bony Prominences: Check appropriate box; if wound is present enter wound assessment in LDA      Occiput:                 [x] WNL  []  Wound present  Face:                     [x] WNL  []  Wound present  Ears:                      [x] WNL  []  Wound present  Spine:                    [x] WNL  []  Wound present  Shoulders:             [x] WNL  []  Wound present  Elbows:                  [x] WNL  []  Wound present  Sacrum/coccyx:     [x] WNL  []  Wound present  Ischial Tuberosity:  [x] WNL  []  Wound present  Trochanter/Hip:      [x] WNL  []  Wound present  Knees:                   [x] WNL  []  Wound present  Ankles:                   [x] WNL  []  Wound present  Heels:                    [x] WNL  []  Wound present  Other pressure areas:  []  Wound location    If wound is present in any of the following from above, please describe findings below:         Device related: []  Device name:         LDA completed if wound present: yes/no    Consult WOCN: yes/no    Other skin related issues, ie tears, rash, etc, document in Integumentary flowsheet      Last BM: Not for this shift     Pending Orders: Labs    Discharge Plan: TBD    POC: Patient and family    Tele: No    Activity: Bed mobility x 2    PT/OT: N/A    Interpreter Needs: N/A    Intake & Output for the shift: No intake/output data recorded.    Shift Note: Patient Aox3, disoriented to time. Patient complained pain at right legs, Tylenol given with a good relieved. All medications given per prescription and pt tolerated w/  crush w/ apple source. All safety in place as protocol, call bell within reach.

## 2023-01-11 NOTE — ED Provider Notes (Shared)
[facility] EMERGENCY DEPARTMENT H&P      Visit date: 01/11/2023      CLINICAL SUMMARY          Diagnosis:    .     Final diagnoses:   None         MDM Notes:      I am the first provider for this patient.  I reviewed the vital signs, nursing notes, past medical history, past surgical history, family history and social history.  I have reviewed the patient's previous charts.    MDM  PMP and available external data reviewed.          Disposition:      {~~Select at end of visit~~:37410}                CLINICAL INFORMATION        HPI:      Chief Complaint: No chief complaint on file.  [name] is a 89 y.o. female who presents with ***     {~~Source of history ~~:29379::"History obtained from: Patient"}          ROS:      Positive and Negative ROS elements as per HPI  All Other Systems Reviewed and Negative: Yes      Physical Exam:      Pulse    BP    Resp    SpO2    Temp      Physical Exam   Nursing note and vitals reviewed.  Constitutional:Pt is oriented to person, place, and time. Pt appears well-developed and well-nourished. No distress.   HEENT:   Head: Normocephalic and atraumatic.   Right Ear: External ear normal.   Left Ear: External ear normal.   Nose: Nose normal.   Mouth/Throat: Oropharynx is clear and moist. No oropharyngeal exudate.   Eyes: Conjunctivae and EOM are normal. Pupils are equal, round, and reactive to light. Right eye exhibits no discharge. Left eye exhibits no discharge. No scleral icterus.   Neck: Normal range of motion. Neck supple. No JVD present. No tracheal deviation present. No thyromegaly present.   Cardiovascular: Normal rate, regular rhythm, normal heart sounds and intact distal pulses.  Exam reveals no gallop and no friction rub.    No murmur heard.  Pulmonary/Chest: Effort normal and breath sounds normal. No stridor. No respiratory distress. Pt has no wheezes. Pt exhibits no tenderness.   Abdominal: There is no tenderness. Soft. Pt exhibits no distension and no  mass.  There is no rebound and no guarding.   Musculoskeletal:  No calf tenderness or edema. Distal pulses intact  Neurological:  No cranial nerve deficit. Pt exhibits normal muscle tone. Coordination normal. MAE.  Skin: Skin is warm and dry. Pt is not diaphoretic.   Psychiatric: Pt has a normal mood and affect. Behavior is normal. Judgment and thought content normal            PAST HISTORY        Primary Care Provider: Hope Budds, MD        PMH/PSH:    .     Medical History[1]    She has no past surgical history on file.      Social/Family History:      She reports that she does not drink alcohol and does not use drugs. No history on file for tobacco use.    No family history on file.      Listed Medications on  Arrival:    .     Home Medications               aspirin EC 81 MG EC tablet     Take 1 tablet (81 mg) by mouth daily     Glycerin, Laxative, (glycerin, ADULT,) 2 g Suppos     Place 1 suppository rectally daily as needed (constipation)     raloxifene (EVISTA) 60 MG tablet     Take 1 tablet (60 mg) by mouth daily     telmisartan (MICARDIS) 80 MG tablet     Take 0.5 tablets (40 mg) by mouth daily     vitamin D (CHOLECALCIFEROL) 25 MCG (1000 UT) tablet     Take 1 tablet (1,000 Units) by mouth daily           Allergies: She has No Known Allergies.            VISIT INFORMATION        Clinical Course in the ED:             Medications Given in the ED:    .     ED Medication Orders (From admission, onward)      None              Procedures:      Procedures      Interpretations:      Pulse ox reviewed by me. All ordered labs and images reviewed by me.     {~~Interpretations~~:28933}              RESULTS        Lab Results:      Results       ** No results found for the last 24 hours. **                Radiology Results:      No orders to display               Scribe Attestation:      I was acting as a scribe for Arlana Lindau, MD on [name],[name]     I am the first provider for this patient and I  personally performed the services documented.  is scribing for me on [name],[name]. This note accurately reflects work and decisions made by me.  Arlana Lindau, MD              [1]   Past Medical History:  Diagnosis Date    HOH (hard of hearing)     Hypertension     Osteoporosis

## 2023-01-11 NOTE — ED Notes (Signed)
Pt placed on fall pad and educated on staying in bed. Pt verbalized understanding.

## 2023-01-11 NOTE — ED to IP RN Note (Signed)
[facility] EMERGENCY DEPT  ED NURSING NOTE FOR THE RECEIVING INPATIENT NURSE   ED NURSE Karista Aispuro/Angelica   SPECTRALINK [id number]   ED CHARGE RN Cait   ADMISSION INFORMATION   [name] is a 89 y.o. female admitted with an ED diagnosis of:    1. Closed nondisplaced basicervical fracture of right femur, initial encounter         Isolation: None   Allergies: Patient has no known allergies.   Holding Orders confirmed? No   Belongings Documented? No   Home medications sent to pharmacy confirmed? No   NURSING CARE   Patient Comes From:   Mental Status: Assisted Living  alert and confused   ADL: Dependent with ADLs   Ambulation: 2 person assist   Pertinent Information  and Safety Concerns:     Broset Violence Risk Level: Moderate BIBA EMS reported that patient had a an unwitnessed fall from [facility] assisted living . Patient was trying to get out off bed when she slipped and fell. pt complaining of right hip pain and lower left flank pain. Denies any head strike of neck pain. Not on blood thinners. Patient was able to recall the fall. A and O x 2 .,+ pedal and distal tibia pulses bilateral present . No noted shortening or rotation on right leg ,capillary refill of less than 2 seconds right lower leg . Patient hard of hearing . PMH dementia     CT / NIH   CT Head ordered on this patient?  Yes   NIH/Dysphagia assessment done prior to admission? No   VITAL SIGNS (at the time of this note)      Vitals:    01/11/23 0230   BP: 160/72   Pulse: 66   Resp: 18   Temp: 97.5 F (36.4 C)   SpO2: 94%     Pain Score: 7-severe pain (01/11/23 0020)

## 2023-01-11 NOTE — Consults (Signed)
Orthopaedic History & Physical  V2.4  Date Time: 01/11/23 11:15 AM  Patient Name: [name]  Attending Physician: Smith Mince, MD       Time Consult called: 0400  Time first seen by orthopaedics: 0500          Assessment & Plan  Orthopaedic Assessment: Right subcapital femoral neck fracture    Reductions/Procedures/Splinting/Anesthesia performed:  Reductions: NO reduction performed  Splinting/Casting: N/A  Procedures: N/A  Anesthesia: N/A        Plan:    -N.p.o. for possible OR later today  -Lovenox 40 mg  -Nonweightbearing right lower extremity  -For consent for surgery       Medical Decision Making: (need 2 of 3)  Diagnosis Complexity:  (based on presenting problem and/or final diagnosis)    Acute Complicated          -  Is fracture pathologic (include osteoporosis or neoplastic lesions): yes, osteoporotic fracture        -  Has Medical Co-morbidities/Surgical Risk-factors: yes       Data (Level 4- need 1, Level 5- need 2): X-rays and CT scan  - Data/Report review (need 3 pts): Radiology reports reviewed: X-rays and CT scan, New Radiology ordered: X-rays, and External provider notes reviewed: Medical hospitalist team   - External Provider discussion : Medical hospitalist team     Risk of Management: High: Urgent/Emergent/Time-sensitive surgery: Risks/benefits of operative versus nonoperative treatment discussed and being weighed by consultants, patient, and family    Danella Maiers   Orthopaedic on-call pager [phone]  Attending Addendum/Attestation:  I have personally seen and examined this patient and have participated in their care. I agree with the clinical information, including the physical exam, radiological/x-ray interpretation. patient history, review of systems and plan as documented above. In addition, I have edited this note to reflect my findings and plan as well as to incorporate any new data.    Patient complains of right hip pain.  Family corroborates this.  Patient has  dementia.  CT scan consistent with right femoral neck nondisplaced fracture.  I do not feel she would cooperate with an MRI given the dementia.  Recommend operative stabilization with percutaneous screw fixation.  N.p.o. after midnight.  Possibly to the OR tomorrow after medical clearance obtained.    Genia Hotter, MD  Orthopaedic Trauma Surgery  Spectra -  [facility]) [phone]  Pager - [phone]  Email - [email or web address]       HPI     [name] is a elderly appearing adult female who presents to the emergency department as a transfer from nursing home after reportedly sustaining a ground-level fall.  On presentation, patient is denying any pain in either of her hips.  Patient does not know her baseline due to her status.  She does not know her past medical history.  Family is not available at bedside to corroborate further history.    Symptoms present since unknown.     Orthopaedic consultation has specifically been requested to address this patient's current musculoskeletal presentation.     Past Medical and Surgical History   Medical History[1]       Patient has BMI=There is no height or weight on file to calculate BMI.  Diagnosis: Underweight           History reviewed. No pertinent surgical history.    Labs     Results       Procedure Component Value Units Date/Time  CBC with Differential (Order) [[id number]]  (Abnormal) Collected: 01/11/23 0311    Specimen: Blood, Venous Updated: 01/11/23 0521    Narrative:      The following orders were created for panel order CBC with Differential (Order).  Procedure                               Abnormality         Status                     ---------                               -----------         ------                     CBC with Differential (C.Marland KitchenMarland Kitchen[[id number]]  Abnormal            Edited Result - FINAL        Please view results for these tests on the individual orders.    CBC with Differential (Component) [[id number]]   (Abnormal) Collected: 01/11/23 0311    Specimen: Blood, Venous Updated: 01/11/23 0521     WBC 6.25 x10 3/uL      Hemoglobin --     Hematocrit 35.0 %      Platelet Count 172 x10 3/uL      MPV 10.8 fL      RBC 2.83 x10 6/uL      MCV 97.9 fL      MCH --     MCHC --     RDW 16 %      nRBC % 0.0 /100 WBC      Absolute nRBC 0.00 x10 3/uL      Preliminary Absolute Neutrophil Count 4.57 x10 3/uL      Neutrophils % 73.1 %      Lymphocytes % 17.4 %      Monocytes % 8.2 %      Eosinophils % 0.5 %      Basophils % 0.3 %      Immature Granulocytes % 0.5 %      Absolute Neutrophils 4.57 x10 3/uL      Absolute Lymphocytes 1.09 x10 3/uL      Absolute Monocytes 0.51 x10 3/uL      Absolute Eosinophils 0.03 x10 3/uL      Absolute Basophils 0.02 x10 3/uL      Absolute Immature Granulocytes 0.03 x10 3/uL     Comprehensive Metabolic Panel [[id number]]  (Abnormal) Collected: 01/11/23 0311    Specimen: Blood, Venous Updated: 01/11/23 0402     Glucose 94 mg/dL      BUN 23 mg/dL      Creatinine 0.7 mg/dL      Sodium 419 mEq/L      Potassium 4.0 mEq/L      Chloride 107 mEq/L      CO2 24 mEq/L      Calcium 8.8 mg/dL      Anion Gap 7.0     GFR >60.0 mL/min/1.73 m2      AST (SGOT) 21 U/L      ALT 14 U/L      Alkaline Phosphatase 95 U/L      Albumin 3.0 g/dL      Protein, Total 5.7 g/dL      Globulin 2.7  g/dL      Albumin/Globulin Ratio 1.1     Bilirubin, Total 0.7 mg/dL     PT/INR [[id number]]  (Normal) Collected: 01/11/23 0311    Specimen: Blood, Venous Updated: 01/11/23 0400     PT 12.0 sec      INR 1.1         Home Medications     Prior to Admission medications    Medication Sig Start Date End Date Taking? Authorizing Provider   aspirin EC 81 MG EC tablet Take 1 tablet (81 mg) by mouth daily    [provider]   Glycerin, Laxative, (glycerin, ADULT,) 2 g Suppos Place 1 suppository rectally daily as needed (constipation) 08/04/22   El-Magbri, Irven Easterly, MD   raloxifene (EVISTA) 60 MG tablet Take 1 tablet (60 mg) by mouth daily     [provider]   telmisartan (MICARDIS) 80 MG tablet Take 0.5 tablets (40 mg) by mouth daily    [provider]   vitamin D (CHOLECALCIFEROL) 25 MCG (1000 UT) tablet Take 1 tablet (1,000 Units) by mouth daily    [provider]       Radiology Studies: (actual Orthopaedic relevant films interpreted & Radiology Reports reviewed by Orthopaedics)     (Personal Interpretation)  CT of the pelvis demonstrates a basicervical femoral neck fracture without evidence of displacement.    CT Pelvis without Contrast (Boney Pelvis)    Result Date: 01/11/2023  1.Osseous demineralization lowering sensitivity for evaluation of nondisplaced fractures. Within confines of limitations, subtle lucency is seen within the right femoral neck worrisome for a nondisplaced fracture. MRI is recommended. 2.Limited evaluation of the left hip secondary to streak artifact and motion. No obvious fractures are identified. However, an asymmetric soft tissue contusion is seen in the left flank. If the patient has left hip pain, consider MRI of the left hip as well. Urgent results were discussed with and acknowledged by Idell Pickles, MD on 01/11/2023 2:53 AM. Campbell Stall 01/11/2023 2:54 AM    CT Head without Contrast    Result Date: 01/11/2023   No evidence of acute intracranial abnormality. Susy Frizzle, MD 01/11/2023 2:53 AM          Physical Exam:   Patient is a 89 y.o. year old female who is alert, well appearing, and in no distress.  Orientation: Disoriented:   Time and Place    BP 160/61   Pulse 63   Temp 97.2 F (36.2 C) (Oral)   Resp 17   SpO2 92%            Heart: normal rate  Lungs: non-labored breathing  Abdomen: soft, non-tender      Right Upper Extremity:   Inspection:  No swelling, erythema, deformity, atrophy or hypertrophy noted  Palpation:  Tenderness-none  Skin: Fragile skin with superficial abrasions throughout   Peripheral Vascular: normal and radial pulse present  Motor: + EPL, FPL, DI.  Sensation  intact light touch radial, median, ulnar nerves    Right Lower Extremity:   Inspection:  No swelling, erythema, deformity, atrophy or hypertrophy noted  Palpation:  Tenderness-none  Skin: normal and bruising   Peripheral Vascular: normal, Tibialis posterior pulse: present, and Dorsalis pedis pulse: present  Motor: + TA, GS, EHL, FHL  Sensation intact light touch dorsal foot, plantar foot     Left Upper Extremity:   Inspection:  No swelling, erythema, deformity, atrophy or hypertrophy noted  Palpation:  Tenderness-none  Skin: Frail skin with multiple  superficial abrasions and flaking   Peripheral Vascular: normal and radial pulse present  Motor: + EPL, FPL, DI.  Sensation intact light touch radial, median, ulnar nerves    Left Lower Extremity:   Inspection:  No swelling, erythema, deformity, atrophy or hypertrophy noted  Palpation:  Tenderness-none  Skin: normal   Peripheral Vascular: normal, Tibialis posterior pulse: present, and Dorsalis pedis pulse: present  Motor: + TA, GS, EHL, FHL  Sensation intact light touch dorsal foot, plantar foot       Pelvis:   Skin: normal  Palpation: Tenderness- none  Stability: normal       N.B. The review of the patient's medications does not in any way constitute an endorsement, by this clinician, of their use, dosage, indications, route, efficacy, interactions, or other clinical parameters.This is also true of any test,lab,or other review that this clinician did not order.    This note was generated within the EP[employer] EMR using Dragon medical speech recognition software and may contain inherent errors or omissions not intended by the user. Grammatical and punctuation errors, random word insertions, deletions, pronoun errors and incomplete sentences are occasional consequences of this technology due to software limitations. Not all errors are caught or corrected.  Although every attempt is made to root out erroneus and incomplete transcription, the note may still not fully represent  the intent or opinion of the author. If there are questions or concerns about the content of this note or information contained within the body of this dictation they should be addressed directly with the author for clarification.         [1]   Past Medical History:  Diagnosis Date    HOH (hard of hearing)     Hypertension     Osteoporosis

## 2023-01-11 NOTE — Nursing Progress Note (Signed)
CMU ADMISSION    Patient admitted to the hospital on 01/11/2023     Admitted from: ED, via: stretcher, family: daughter and son in law    Originally stays at: assisted living    Orientation: room 945, Call bell: within reach    Telemetry: no    Belongings: footwear    Any home meds: no    Prior to admission, Have you had any loose BM within 48 hours?  unknown    4 eyes in 4 hours pressure injury assessment note:  scattered bruises, abrasions, bilateral lower extremity edema and redness, non blanchable redness on sacrum    Completed with: Harriett Sine, RN   Unit & Time admitted: NT9, 0540             Bony Prominences: Check appropriate box; if wound is present enter wound assessment in LDA     Occiput:                 [x] WNL  []  Wound present  Face:                     [x] WNL  []  Wound present  Ears:                      [x] WNL  []  Wound present  Spine:                    [x] WNL  []  Wound present  Shoulders:             [x] WNL  []  Wound present  Elbows:                  [x] WNL  []  Wound present  Sacrum/coccyx:     [x] WNL  []  Wound present  Ischial Tuberosity:  [x] WNL  []  Wound present  Trochanter/Hip:      [x] WNL  []  Wound present  Knees:                   [x] WNL  []  Wound present  Ankles:                   [x] WNL  []  Wound present  Heels:                    [x] WNL  []  Wound present  Other pressure areas:  []  Wound location       Device related: []  Device name:       If wound is present, please describe here:         LDA completed if wound present: yes/no  Consult WOCN if necessary    Other skin related issues, ie tears, rash, etc:      Wound care Consult: n/a    Review of Systems    Peripheral IV 01/11/23 20 G Diffusion Left Antecubital (Active)   Site Assessment Clean;Dry;Intact 01/11/23 0258   Line Status Saline Locked;Blood return noted;Flushed 01/11/23 0258   Dressing Status Clean;Dry;Intact 01/11/23 0258   Number of days: 0         @FOLEY @      Admission Note: Patient is alert, unable to determine orientation due to  [facility]. Keeps  asking, "what happened to me?". All safety precautions in place, call bell within reach. Family at bedside.

## 2023-01-11 NOTE — ED Notes (Signed)
Bed: S B1  Expected date:   Expected time:   Means of arrival:   Comments:  Trauma 4 here

## 2023-01-11 NOTE — UM Notes (Signed)
Adult Admit to Inpatient ([facility] Only) (Order [id number]) on 01/11/23       PATIENT NAME: Mann,Amy R   DOB: 1926/05/28     Today's date: 01/11/2023  MedSurg Tele Unit  Initial Review    01/11/2023:  89yo F with history of dementia, osteoporosis and hypertension adm post unwitnessed fall from assisted living with nondisplaced right hip fracture.     Adm for Unwitnessed fall, Right hip fracture.  History of osteoporosis.    P: N.p.o.after midnight.  Anticipate surgery planned in the morning.  DVT prophylaxis.  Pain control.  Postop PT OT per primary team.  Pain control    On RA:  BP 175/61   Pulse 66   Temp 97.2 F (36.2 C) (Oral)   Resp 17   SpO2 95%     CT Pelvis: osseous demineralization lowering sensitivity for evaluation of nondisplaced fractures. Within confines of limitations, subtle lucency is seen within the right femoral neck worrisome for a nondisplaced fracture.MRI is recommended. Limited evaluation of the left hip secondary to streak artifact and motion. No obvious fractures are identified. However, an asymmetric soft tissue contusion is seen in the left flank. If the patient has left hip pain, consider MRI of the left hip as well.    01/12/2023:  Per Ortho: right subcapital femoral neck fracture. Pending OR on 7/21     Per Medicine: BP remains elevated despite continuing home ARB (valsartan substituted for home irbesartan). Early labs showed K 5.9, repeated normal, c/w lab error      01/11/23 03:11   Platelet Count 172   RBC 2.83 (L)   MCV 97.9 (H)   RDW 16 (H)      01/11/23 03:11   BUN 23 (H)      01/11/23 03:11   Albumin 3.0 (L)   Protein Total 5.7 (L)     Current Facility-Administered Medications   Medication Dose Route Frequency    acetaminophen  1,000 mg Oral Q8H SCH    [Transfer Hold] acetaminophen  650 mg Oral 4 times per day    [Transfer Hold] enoxaparin  40 mg Subcutaneous Q24H SCH    polyethylene glycol  17 g Oral Daily    [Transfer Hold] valsartan  160 mg Oral Daily     Continuous  Infusions:   sodium chloride 75 mL/hr at 01/12/23 1103       Lafayette Dragon, RN, BSN, [employer]  Utilization Review CM II  [facility]  [address]  [place], [place] [address]  Phone: [phone] (M-F 8:00-430 pm EST)  UR Main no: [phone]  Fax: [phone]  [email or web address]

## 2023-01-11 NOTE — ED Notes (Signed)
Bed: S 04  Expected date:   Expected time:   Means of arrival:   Comments:  Trauma 4

## 2023-01-11 NOTE — Plan of Care (Signed)
Patient admitted earlier this morning to orthopedic service for nondisplaced right hip fracture.  She has history of dementia and hypertension.  Preop evaluation was done by hospitalist nocturnist, determined no additional testing.  Patient is now awaiting orthopedic surgical plan.  Blood pressure is moderately elevated, possibly due to pain. We have reordered home BP meds, started scheduled Tylenol and IVF while NPO. Will keep opiates at very low doses as needed for pain, which can escalate as needed later, to minimize risk of delirium in an elderly patient with dementia    Burman Freestone, MD, FACP  [facility] Adult Hospitalist  Pager [phone]  Office: [phone]  Spectralink [phone]  [email or web address]

## 2023-01-12 ENCOUNTER — Inpatient Hospital Stay: Payer: Medicare Other

## 2023-01-12 ENCOUNTER — Inpatient Hospital Stay: Payer: Medicare Other | Admitting: Anesthesiology

## 2023-01-12 ENCOUNTER — Encounter
Admission: EM | Disposition: A | Discharge status: Skilled Nursing Facility | Payer: Self-pay | Source: Home / Self Care | Attending: Orthopaedic Trauma

## 2023-01-12 DIAGNOSIS — W1830XA Fall on same level, unspecified, initial encounter: Secondary | ICD-10-CM

## 2023-01-12 DIAGNOSIS — M80051A Age-related osteoporosis with current pathological fracture, right femur, initial encounter for fracture: Secondary | ICD-10-CM

## 2023-01-12 HISTORY — PX: CLOSED REDUCTION, HIP, CANNULATED SCREWS: SHX3403

## 2023-01-12 LAB — COMPREHENSIVE METABOLIC PANEL
ALT: 16 U/L (ref 0–55)
AST (SGOT): 33 U/L (ref 5–41)
Albumin/Globulin Ratio: 1 (ref 0.9–2.2)
Albumin: 3 g/dL — ABNORMAL LOW (ref 3.5–5.0)
Alkaline Phosphatase: 99 U/L (ref 37–117)
Anion Gap: 7 (ref 5.0–15.0)
BUN: 18 mg/dL (ref 7–21)
Bilirubin, Total: 0.7 mg/dL (ref 0.2–1.2)
CO2: 24 mEq/L (ref 17–29)
Calcium: 8.6 mg/dL (ref 7.9–10.2)
Chloride: 104 mEq/L (ref 99–111)
Creatinine: 0.7 mg/dL (ref 0.4–1.0)
GFR: >60 mL/min/{1.73_m2} (ref >=60.0)
Globulin: 3.1 g/dL (ref 2.0–3.6)
Glucose: 89 mg/dL (ref 70–100)
Potassium: 5.9 mEq/L — ABNORMAL HIGH (ref 3.5–5.3)
Protein, Total: 6.1 g/dL (ref 6.0–8.3)
Sodium: 135 mEq/L (ref 135–145)

## 2023-01-12 LAB — ECG 12-LEAD
Atrial Rate: 77 {beats}/min
IHS MUSE NARRATIVE AND IMPRESSION: NORMAL
P Axis: -1 degrees
Q-T Interval: 386 ms
QRS Duration: 70 ms
QTC Calculation (Bezet): 436 ms

## 2023-01-12 LAB — BASIC METABOLIC PANEL
Anion Gap: 7 (ref 5.0–15.0)
BUN: 19 mg/dL (ref 7–21)
CO2: 23 mEq/L (ref 17–29)
Calcium: 8.7 mg/dL (ref 7.9–10.2)
Chloride: 106 mEq/L (ref 99–111)
Creatinine: 0.7 mg/dL (ref 0.4–1.0)
GFR: >60 mL/min/{1.73_m2} (ref >=60.0)
Glucose: 95 mg/dL (ref 70–100)
Potassium: 4.3 mEq/L (ref 3.5–5.3)
Sodium: 136 mEq/L (ref 135–145)

## 2023-01-12 SURGERY — CLOSED REDUCTION, HIP, CANNULATED SCREWS
Anesthesia: Anesthesia General | Site: Hip | Laterality: Right | Wound class: Clean

## 2023-01-12 MED ORDER — ONDANSETRON HCL 4 MG/2ML IJ SOLN
INTRAMUSCULAR | Status: DC | PRN
Start: 2023-01-12 — End: 2023-01-12
  Administered 2023-01-12: 4 mg via INTRAVENOUS

## 2023-01-12 MED ORDER — SODIUM CHLORIDE 0.9 % IR SOLN
Status: DC | PRN
Start: 2023-01-12 — End: 2023-01-12
  Administered 2023-01-12: 1000 mL

## 2023-01-12 MED ORDER — LACTATED RINGERS IV SOLN
INTRAVENOUS | Status: DC | PRN
Start: 2023-01-12 — End: 2023-01-12

## 2023-01-12 MED ORDER — POLYETHYLENE GLYCOL 3350 17 G PO PACK
17.0000 g | PACK | Freq: Every day | ORAL | Status: DC
Start: 2023-01-12 — End: 2023-01-14
  Administered 2023-01-12 – 2023-01-14 (×3): 17 g via ORAL
  Filled 2023-01-12 (×3): qty 1

## 2023-01-12 MED ORDER — DEXAMETHASONE SODIUM PHOSPHATE 4 MG/ML IJ SOLN (WRAP)
INTRAMUSCULAR | Status: DC | PRN
Start: 2023-01-12 — End: 2023-01-12
  Administered 2023-01-12: 4 mg via INTRAVENOUS

## 2023-01-12 MED ORDER — SODIUM CHLORIDE 0.9 % IV SOLN
INTRAVENOUS | Status: DC
Start: 2023-01-12 — End: 2023-01-12

## 2023-01-12 MED ORDER — STERILE WATER FOR INJECTION IJ/IV SOLN (WRAP)
2.0000 g | Freq: Three times a day (TID) | INTRAMUSCULAR | Status: DC
Start: 2023-01-12 — End: 2023-01-14
  Administered 2023-01-12 – 2023-01-13 (×4): 2 g via INTRAVENOUS
  Filled 2023-01-12: qty 2000
  Filled 2023-01-12: qty 20
  Filled 2023-01-12 (×3): qty 2000

## 2023-01-12 MED ORDER — PHENYLEPHRINE 100 MCG/ML IV BOLUS (ANESTHESIA)
PREFILLED_SYRINGE | INTRAVENOUS | Status: DC | PRN
Start: 2023-01-12 — End: 2023-01-12
  Administered 2023-01-12: 100 ug via INTRAVENOUS

## 2023-01-12 MED ORDER — KETOROLAC TROMETHAMINE 30 MG/ML IJ SOLN
INTRAMUSCULAR | Status: DC | PRN
Start: 2023-01-12 — End: 2023-01-12
  Administered 2023-01-12: 15 mg via INTRAVENOUS

## 2023-01-12 MED ORDER — PROPOFOL 10 MG/ML IV EMUL (WRAP)
INTRAVENOUS | Status: DC | PRN
Start: 2023-01-12 — End: 2023-01-12
  Administered 2023-01-12: 100 mg via INTRAVENOUS

## 2023-01-12 MED ORDER — FENTANYL CITRATE (PF) 50 MCG/ML IJ SOLN (WRAP)
INTRAMUSCULAR | Status: DC | PRN
Start: 2023-01-12 — End: 2023-01-12
  Administered 2023-01-12 (×4): 25 ug via INTRAVENOUS

## 2023-01-12 MED ORDER — EPHEDRINE SULFATE 50 MG/ML IJ/IV SOLN (WRAP)
Status: DC | PRN
Start: 2023-01-12 — End: 2023-01-12
  Administered 2023-01-12: 15 mg via INTRAVENOUS

## 2023-01-12 MED ORDER — LIDOCAINE HCL 2 % IJ SOLN
INTRAMUSCULAR | Status: DC | PRN
Start: 2023-01-12 — End: 2023-01-12
  Administered 2023-01-12: 40 mg

## 2023-01-12 MED ORDER — ACETAMINOPHEN 500 MG PO TABS
1000.0000 mg | ORAL_TABLET | Freq: Four times a day (QID) | ORAL | Status: DC
Start: 2023-01-12 — End: 2023-01-14
  Administered 2023-01-12 – 2023-01-14 (×7): 1000 mg via ORAL
  Filled 2023-01-12 (×8): qty 2

## 2023-01-12 MED ORDER — GLYCOPYRROLATE 0.2 MG/ML IJ SOLN (WRAP)
INTRAMUSCULAR | Status: DC | PRN
Start: 2023-01-12 — End: 2023-01-12
  Administered 2023-01-12: .1 mg via INTRAVENOUS

## 2023-01-12 SURGICAL SUPPLY — 10 items
GUIDEWIRE ORTHOPEDIC L300 MM FLUTE DRILL POINT TIP NONTHREAD CALIBRATE (Guide Wire) ×3 IMPLANT
GUIDEWIRE ORTHOPEDIC SYNTHES OD2.8 MM (Guide Wire) ×3
SCREW L80 MM OD6.5 MM FULL THREAD (Screw) ×1 IMPLANT
SCREW L80 MM OD6.5 MM FULL THREAD REVERSE CUT FLUTE STAINLESS STEEL (Screw) ×1 IMPLANT
SCREW L85 MM OD6.5 MM FULL THREAD (Screw) ×1 IMPLANT
SCREW L85 MM OD6.5 MM FULL THREAD REVERSE CUT FLUTE STAINLESS STEEL (Screw) ×1 IMPLANT
SCREW L90 MM OD6.5 MM FULL THREAD (Screw) ×1 IMPLANT
SCREW L90 MM OD6.5 MM FULL THREAD REVERSE CUT FLUTE STAINLESS STEEL (Screw) ×1 IMPLANT
WASHER OD13 MM ORTHOPEDIC STAINLESS STEEL 6.5/7/7.3 MM CANNULATED (Washer) ×2 IMPLANT
WASHER OD13 MM SYNTHES ORTHOPEDIC (Washer) ×2 IMPLANT

## 2023-01-12 NOTE — Progress Notes (Signed)
Medicine Shift Note    Braden Scale Score: 18 (01/12/23 0740)  Skin Assessment: Blanchable Redness, Bruising, Surgical incision  Bruising Skin Location: scattered  Patient Lines/Drains/Airways Status       Active Lines, Drains and Airways       Name Placement date Placement time Site Days    Peripheral IV 01/12/23 22 G Standard Posterior;Right Wrist 01/12/23  0500  Wrist  less than 1                   Wound 01/12/23 Leg Right (Active)   Date First Assessed/Time First Assessed: 01/12/23 1533   Wound Type: Surgical Incision  Location: Leg  Wound Location Orientation: Right      Assessments 01/12/2023  3:30 PM 01/12/2023  4:10 PM   Wound Base Description Dressing Covering Site (UTA) Dressing Covering Site (UTA)   Drainage Amount None None   Dressing Gauze;Transparent Film Gauze;Transparent Film       No associated orders.       Skin  4 eyes in 4 hours pressure injury assessment note:      Completed with:             Bony Prominences: Check appropriate box; if wound is present enter wound assessment in LDA      Occiput:                 [x] WNL  []  Wound present  Face:                     [x] WNL  []  Wound present  Ears:                      [x] WNL  []  Wound present  Spine:                    [x] WNL  []  Wound present  Shoulders:             [x] WNL  []  Wound present  Elbows:                  [x] WNL  []  Wound present  Sacrum/coccyx:     [x] WNL  []  Wound present  Ischial Tuberosity:  [x] WNL  []  Wound present  Trochanter/Hip:      [x] WNL  []  Wound present  Knees:                   [x] WNL  []  Wound present  Ankles:                   [x] WNL  []  Wound present  Heels:                    [x] WNL  []  Wound present  Other pressure areas:  []  Wound location    If wound is present in any of the following from above, please describe findings below:         Device related: []  Device name:         LDA completed if wound present: yes/no    Consult WOCN: yes/no    Other skin related issues, ie tears, rash, etc, document in Integumentary  flowsheet      Last BM: PTA, start bowel regimen     Pending Orders: labs     Discharge Plan: TBD     POC: daughter at the bedside     Tele: N/A    Activity:x2 assist bed mobility and  BSC     PT/OT: ordered     Interpreter Needs: N/A    Intake & Output for the shift: No intake/output data recorded.    Shift Note: pt is A&Ox1, BP elevated despite scheduled and PRN anti-HTN meds given. All medication are given. Scheduled and PRN pain medication given with positive effect. All safety and fall precaution in place; purposely rounding maintain. Call light within reach.

## 2023-01-12 NOTE — Op Note (Signed)
FULL OPERATIVE NOTE    Date Time: 01/12/23 5:16 PM  Patient Name: [name], [name] (MRN: [id number])  Attending Physician: Smith Mince, MD      Date of Operation:   01/12/2023    Providers Performing:   Surgeons and Role:     Gregery Na, MD - Primary     * Danella Maiers, MD - Resident - Assisting    Surgical First Assistant(s):   None    Operative Procedure:   Right femoral neck fracture percutaneous fixation and screw placement  Preoperative Diagnosis:   Right pathologic femoral neck fracture, secondary to osteoporosis    Postoperative Diagnosis:   Right pathologic femoral neck fracture, secondary to osteoporosis    Anesthesia:   general    Findings:   Right impacted femoral neck fracture.  Osteoporotic bone quality.    Estimated Blood Loss:   40 mL    Implants:     Implant Name Type Inv. Item Serial No. Model No. Manufacturer Lot No. LRB No. Used Action   WASHER OD13 MM [employer] - [id number] Washer WASHER OD13 MM [employer]  219.99 [employer]  Right 2 Implanted   SCREW L80 MM OD6.5 MM FULL THREAD - [id number] Screw SCREW L80 MM OD6.5 MM FULL THREAD  [id number] [employer]  Right 1 Implanted   SCREW L85 MM OD6.5 MM FULL THREAD - [id number] Screw SCREW L85 MM OD6.5 MM FULL THREAD  208.473 [employer]  Right 1 Implanted   SCREW L90 MM OD6.5 MM FULL THREAD - [id number] Screw SCREW L90 MM OD6.5 MM FULL THREAD  208.474 [employer]  Right 1 Implanted     Drains:   Drains: no    Specimens:   * No specimens in log *    Complications:   None    Operative Notes:   Indications for procedure: Please see my consultation/notes for details.  The patient had a fall and sustained the above hip fracture.  I discussed treatment options with patient / family including nonoperative management, operative repair, and arthroplasty. They wish to proceed with operative fixation. Risks, benefits and alternatives were discussed.  Consent was obtained.     Risks of surgery: Pain, infection, nerve  and artery injury, bleeding, hardware failure, malunion, nonunion, refracture, dislocation, leg length inequality, malalignment, complex regional pain syndrome, neuropathy, anemia, medical complications including but not limited to cardiovascular, respiratory, neurologic, blood clots, and death.     Description of procedure: The patient was identified in the preoperative holding area, the correct limb was marked with ink, and the consent was verified.  The patient was transferred to the operating room and administered anesthesia.  Both feet were padded with the cast padding and Coban and then placed in the fracture boots on the Hana table. The limb was prepped and draped in normal sterile fashion.  All bony prominences were well-padded. Prophylactic antibiotics were administered and an SCD was placed on the nonoperative leg.  A final timeout was taken identifying the patient and surgical location.  All parties were in agreement.     I obtained imaging and confirmed maintenance of appropriate reduction.  I made a 4 cm incision on the lateral proximal femur and incised the iliotibial band.  I inserted the inferior guidewire along the inferior femoral neck and head on the AP and centered on the lateral.  I inserted 2 additional guidewires the superior one third of the femoral neck and head on AP, and anterior and posterior respectively on the  lateral.  I measured, drilled the outer cortex, and inserted the appropriate length fully threaded Synthes 7.3 mm cannulated screws with washers on 2 of the screws, one without a washer. All had excellent purchase.      Final fluoroscopic imaging including but not limited to AP and lateral views of the hip demonstrated appropriate reduction and fixation of the fracture and appropriate hardware placement.  Wounds were irrigated and hemostasis was achieved.  Fascia was closed with #1 Vicryl, subcutaneous tissue with 2-0 Monocryl and skin with staples.  Sterile dressings were  applied.  Leg lengths were equal and rotation was symmetric.  Compartments were soft and pedal pulses were palpable.  The patient was extubated and transferred to the PACU in stable condition.  All counts were correct at the end of the procedure.     Postoperative plan:   Weight-bear as tolerated right lower extremity.    24 hours antibiotics.    DVT prophylaxis 6 weeks.  Ok to change dressing and shower in 3 days.    Follow up in office in 2 weeks for staple removal. No xrays at 2 weeks.      Signed by: Gregery Na, MD  [facility]

## 2023-01-12 NOTE — Nursing Progress Note (Signed)
Medicine Shift Note    Braden Scale Score: 17 (01/11/23 2300)  Skin Assessment: Blanchable Redness, Bruising  Bruising Skin Location: scattered  Patient Lines/Drains/Airways Status       Active Lines, Drains and Airways       Name Placement date Placement time Site Days    Peripheral IV 01/12/23 22 G Standard Posterior;Right Wrist 01/12/23  0500  Wrist  less than 1                      Skin  4 eyes in 4 hours pressure injury assessment note:      Completed with:             Bony Prominences: Check appropriate box; if wound is present enter wound assessment in LDA      Occiput:                 [x] WNL  []  Wound present  Face:                     [x] WNL  []  Wound present  Ears:                      [x] WNL  []  Wound present  Spine:                    [x] WNL  []  Wound present  Shoulders:             [x] WNL  []  Wound present  Elbows:                  [x] WNL  []  Wound present  Sacrum/coccyx:     [x] WNL  []  Wound present  Ischial Tuberosity:  [x] WNL  []  Wound present  Trochanter/Hip:      [x] WNL  []  Wound present  Knees:                   [x] WNL  []  Wound present  Ankles:                   [x] WNL  []  Wound present  Heels:                    [x] WNL  []  Wound present  Other pressure areas:  []  Wound location    If wound is present in any of the following from above, please describe findings below:         Device related: []  Device name:         LDA completed if wound present: yes/no    Consult WOCN: yes/no    Other skin related issues, ie tears, rash, etc, document in Integumentary flowsheet      Last BM: unknown    Pending Orders: labs    Discharge Plan: TBD    POC: Patient    Tele: no    Activity: Bed mobility x2    PT/OT: N/A    Interpreter Needs: n/a    Intake & Output for the shift: No intake/output data recorded.    Shift Note: Patient is alert and oriented x1, to self. Very forgetful and confused. Tried to get out of bed multiple times. PRN oxycodone given x1 for pain. NPO throughout night, awaiting surgery. All  safety precautions in place, call bell within reach.

## 2023-01-12 NOTE — Plan of Care (Signed)
Problem: Pain interferes with ability to perform ADL  Goal: Pain at adequate level as identified by patient  Outcome: Progressing  Flowsheets (Taken 01/12/2023 0754)  Pain at adequate level as identified by patient:   Identify patient comfort function goal   Assess for risk of opioid induced respiratory depression, including snoring/sleep apnea. Alert healthcare team of risk factors identified.   Reassess pain within 30-60 minutes of any procedure/intervention, per Pain Assessment, Intervention, Reassessment (AIR) Cycle   Offer non-pharmacological pain management interventions   Consult/collaborate with Physical Therapy, Occupational Therapy, and/or Speech Therapy   Include patient/patient care companion in decisions related to pain management as needed     Problem: Side Effects from Pain Analgesia  Goal: Patient will experience minimal side effects of analgesic therapy  Outcome: Progressing  Flowsheets (Taken 01/12/2023 0754)  Patient will experience minimal side effects of analgesic therapy:   Monitor/assess patient's respiratory status (RR depth, effort, breath sounds)   Assess for changes in cognitive function   Prevent/manage side effects per LIP orders (i.e. nausea, vomiting, pruritus, constipation, urinary retention, etc.)   Evaluate for opioid-induced sedation with appropriate assessment tool (i.e. POSS)     Problem: Moderate/High Fall Risk Score >5  Goal: Patient will remain free of falls  Outcome: Progressing  Flowsheets (Taken 01/12/2023 0754)  High (Greater than 13):   HIGH-Visual cue at entrance to patient's room   HIGH-Bed alarm on at all times while patient in bed   HIGH-Utilize chair pad alarm for patient while in the chair   HIGH-Apply yellow "Fall Risk" arm band   HIGH-Pharmacy to initiate evaluation and intervention per protocol   HIGH-Initiate use of floor mats as appropriate   HIGH-Consider use of low bed     Problem: Compromised Sensory Perception  Goal: Sensory Perception  Interventions  Outcome: Progressing  Flowsheets (Taken 01/12/2023 0740)  Sensory Perception Interventions: Offload heels, Pad bony prominences, Reposition q 2hrs/turn Clock, Q2 hour skin assessment under devices if present     Problem: Compromised Moisture  Goal: Moisture level Interventions  Outcome: Progressing  Flowsheets (Taken 01/12/2023 0740)  Moisture level Interventions: Moisture wicking products, Moisture barrier cream     Problem: Compromised Activity/Mobility  Goal: Activity/Mobility Interventions  Outcome: Progressing  Flowsheets (Taken 01/12/2023 0740)  Activity/Mobility Interventions: Pad bony prominences, TAP Seated positioning system when OOB, Promote PMP, Reposition q 2 hrs / turn clock, Offload heels     Problem: Compromised Nutrition  Goal: Nutrition Interventions  Outcome: Progressing  Flowsheets (Taken 01/12/2023 0740)  Nutrition Interventions: Discuss nutrition at Rounds, I&Os, Document % meal eaten, Daily weights     Problem: Compromised Friction/Shear  Goal: Friction and Shear Interventions  Outcome: Progressing  Flowsheets (Taken 01/12/2023 0740)  Friction and Shear Interventions: Pad bony prominences, Off load heels, HOB 30 degrees or less unless contraindicated, Consider: TAP seated positioning, Heel foams

## 2023-01-12 NOTE — Transfer of Care (Signed)
Anesthesia Transfer of Care Note    Patient: [name]    Procedures performed: Procedure(s):  CLOSED REDUCTION, HIP, CANNULATED SCREWS    Anesthesia type: General ETT    Patient location:Phase I PACU    Last vitals:   Vitals:    01/12/23 1625   BP: 151/66   Pulse: (!) 106   Resp: 20   Temp: 36.4 C (97.5 F)   SpO2: 90%       Post pain: Patient not complaining of pain, continue current therapy      Mental Status:awake and alert     Respiratory Function: tolerating face mask    Cardiovascular: stable    Nausea/Vomiting: patient not complaining of nausea or vomiting    Hydration Status: adequate    Post assessment: no apparent anesthetic complications and no reportable events    Signed by: Marlowe Aschoff, MD  01/12/23 5:14 PM

## 2023-01-12 NOTE — Progress Notes (Signed)
MEDICINE CONSULT FOLLOW-UP    Date Time: 01/12/23 1:05 PM  Patient Name: [name]  Attending Physician: Smith Mince, MD  Consulting Physician: Burman Freestone, MD, MD      Assessment:   Principal Problem:    Closed fracture of neck of femur, unspecified laterality, initial encounter  Active Problems:    Closed nondisplaced basicervical fracture of right femur, initial encounter  Resolved Problems:    * No resolved hospital problems. *      89 year old female with history of dementia, osteoporosis and hypertension admitted post unwitnessed fall from assisted living with nondisplaced right hip fracture.     INTERVAL:  7/21 - for OR today  BP remains elevated despite continuing home ARB (valsartan substituted for home irbesartan). Early labs showed K 5.9, repeated normal, c/w lab error    SUBJECTIVE: Patient reports "I feel fine"      Recommendations:   # Nondisplaced right hip fracture  -OR today, expecting percutaneous screw fixation    # Hypertension, persistently elevated blood pressure despite valsartan  -Possibly exacerbated by stress of acute injury and pain  -Continue valsartan and additional short acting hydralazine as needed    # Spurious hyperkalemia  - Normal on repeat    # Osteoporosis  -Already on outpatient medical treatment which will be continued post discharge    Updated family in room    Physical Exam:   Patient Vitals for the past 24 hrs:   BP Temp Temp src Pulse Resp SpO2   01/12/23 1249 185/45 -- -- -- -- --   01/12/23 1223 (!) 197/95 -- -- -- -- --   01/12/23 1112 181/70 97.5 F (36.4 C) Axillary 62 20 93 %   01/12/23 0859 178/72 -- -- -- -- --   01/12/23 0632 159/83 98.1 F (36.7 C) Oral 67 19 92 %   01/12/23 0356 186/65 97.9 F (36.6 C) Oral (!) 59 18 91 %   01/11/23 1835 161/68 97.5 F (36.4 C) Oral 74 17 91 %   01/11/23 1605 174/67 97.7 F (36.5 C) Oral (!) 54 -- 94 %     Body mass index is 19.2 kg/m.  No intake or output data in the 24 hours ending 01/12/23  1305    General: awake, alert, oriented x 3; no acute distress.  Cardiovascular: regular rate and rhythm  Lungs: clear to auscultation bilaterally, without wheezing, rhonchi, or rales  Abdomen: soft, non-tender, non-distended; no palpable masses, no hepatosplenomegaly, normoactive bowel sounds, no rebound or guarding  Extremities: no clubbing, cyanosis, or edema      Meds:     Medications were reviewed:  Current Facility-Administered Medications   Medication Dose Route Frequency    acetaminophen  1,000 mg Oral Q8H SCH    [Transfer Hold] acetaminophen  650 mg Oral 4 times per day    ceFAZolin  2 g Intravenous Pre-Op    [Transfer Hold] enoxaparin  40 mg Subcutaneous Q24H SCH    polyethylene glycol  17 g Oral Daily    [Transfer Hold] valsartan  160 mg Oral Daily     Infusion Meds[1]  PRN Medications[2]      Labs:     Recent Labs     01/11/23  0311   WBC 6.25   Hematocrit 35.0   Platelet Count 172       Recent Labs     01/12/23  0824 01/12/23  0504   Sodium 136 135   Potassium 4.3 5.9*   Chloride  106 104   CO2 23 24   BUN 19 18   Creatinine 0.7 0.7   Glucose 95 89   Calcium 8.7 8.6       Recent Labs     01/12/23  0504 01/11/23  0311   AST (SGOT) 33 21   ALT 16 14   Alkaline Phosphatase 99 95   Protein, Total 6.1 5.7*   Albumin 3.0* 3.0*       Recent Labs     01/11/23  0311   PT 12.0   INR 1.1       Imaging personally reviewed, including: NA          Case discussed with: family and pt        Signed by: Burman Freestone, MD, MD  Burman Freestone, MD, FACP  [facility]  Pager [phone]  Office: [phone]  Spectralink [phone]  [email or web address]                       [1]   Current Facility-Administered Medications   Medication Dose Route Frequency Last Rate    sodium chloride   Intravenous Continuous 75 mL/hr at 01/12/23 1103   [2]   Current Facility-Administered Medications   Medication Dose Route    [Transfer Hold] hydrALAZINE  5 mg Intravenous    oxyCODONE  2.5 mg Oral    [Transfer Hold] oxyCODONE  2.5 mg Oral    oxyCODONE   5 mg Oral

## 2023-01-12 NOTE — Progress Notes (Addendum)
Orthopedic Trauma Daily Progress Note    01/12/2023 9:12 AM    [name] is a 89 y.o. female with right subcapital femoral neck fracture.    Subjective: NAEON. Patient Denies nausea, vomiting, or fevers.     Physical Exam:  Vitals:    01/12/23 0859   BP: 178/72   Pulse:    Resp:    Temp:    SpO2:       No intake or output data in the 24 hours ending 01/12/23 0912  Lab Results   Component Value Date    INR 1.1 01/11/2023       RLE:  Dementia, AAOx1   Firing GSC, TA, EHL.  SILT in Su/Sa/SPN/DPN/Tib distribution.  Foot WWP.    Lab Results   Component Value Date    HGB  01/11/2023      Comment:      Not measured. Unable to report due to interfering substance.  This is a corrected result. Previous result was 10.9 g/dL on 0/96/0454 at 0981 EDT      Lab Results   Component Value Date    HCT 35.0 01/11/2023    Review of pertinent labs demonstrate:   - Hemodynamic stability with no significant changes.            Assessment: 89 y.o. female with right femoral neck fracture.    Plan:   - Mobility: Out of bed as tolerated with PT/OT after OR   - Pain control: Continue to wean/titrate to appropriate oral regimen   - DVT Prophylaxis (per Ortho Trauma service Protocol): - Lovenox 40 mg SQ daily x 6 weeks (from pelvis to subtrochanteric femur)   - Foley catheter status: Does not have Foley   - Further surgical plans: Pending OR on 7/21    - RUE: WBAT   - LUE:  WBAT   - RLE:  NWB   - LLE:  WBAT   - No transfusion indicated, but will continue to follow serial labs and titrate IV fluids & volume expanders as indicated to optimize hemodynamics/volume status.   - Disposition: Pending OR    Betha Loa, MD     I have personally interviewed and examined the patient.  I have reviewed the provider's history, exam, assessment and management plans. I concur with or have edited all elements of the provider's note.      I had an extensive discussion with the patient's family regarding her injury, diagnosis, treatment options and  alternatives as well as risks and benefits.  Please see my partners in previous notes for further details regarding this patient's treatment plan and care.  As per previous discussions she was noted to have an apparent right impacted femoral neck fracture on CT scan.  Although the CT scan is somewhat equivocal she appears to have right hip pain which was corroborated by her family who reports that she will walk on her right hip and previously was ambulatory with a rollator before recent fall.  Given this, I have recommended surgical stabilization with percutaneous screw fixation.  The family is in agreement and would like to proceed with surgery.  Consent was obtained.  All questions were answered.    Gregery Na, MD

## 2023-01-12 NOTE — Brief Op Note (Signed)
.  Date/Time: 3:37 PM, 01/12/2023    Patient Name:     [name]     Date of Operation:     01/12/2023     Providers Performing:     Surgeons and Role:     Gregery Na, MD - Primary     * Danella Maiers, MD - Resident - Assisting      Diagnosis:     Right pathologic femoral neck fracture, secondary to osteoporosis    Procedure:     Procedure(s) (LRB):  CLOSED REDUCTION, HIP, CANNULATED SCREWS (Right)         Anesthesia:      General  No responsible provider has been recorded for the case.  Anesthesiologist: Marlowe Aschoff, MD  CRNA: Berline Lopes, CRNA; Newt Minion, CRNA        Estimated Blood Loss:     40 mL     Implants:     Implant Name Type Inv. Item Serial No. Manufacturer Lot No. LRB No. Used Action   WASHER OD13 MM [employer] - [id number] Washer WASHER OD13 MM [employer]  [employer]  Right 2 Implanted   SCREW L80 MM OD6.5 MM FULL THREAD - [id number] Screw SCREW L80 MM OD6.5 MM FULL THREAD  [employer]  Right 1 Implanted   SCREW L85 MM OD6.5 MM FULL THREAD - [id number] Screw SCREW L85 MM OD6.5 MM FULL THREAD  [employer]  Right 1 Implanted   SCREW L90 MM OD6.5 MM FULL THREAD - [id number] Screw SCREW L90 MM OD6.5 MM FULL THREAD  [employer]  Right 1 Implanted        Drains:     None     Complications:     None     Weightbearing Status:     WBAT Right lower extremity     WB Status of other injured extremities: N/A        Postop Plan (Wound mgt, Return to OR, etc):     Wound Management:  - Definitively Closed    DVT Prophylaxis:   - Lovenox 40 mg SQ daily x 6 weeks (from pelvis to subtrochanteric femur)    Foley catheter status: Does not have Foley    Dressings:  - Soft Dressings (no splint or cast): Dressing change on post-op day 2, then nursing/patient may change dressings as needed with clean gauze and tape or ACE bandages, unless otherwise instructed.      Antibiotics:  - Will continue standard prophylaxis x24 hours    Plan:  - No further orthopedic surgical  interventions planned           Danella Maiers, MD

## 2023-01-12 NOTE — Plan of Care (Signed)
Problem: Pain interferes with ability to perform ADL  Goal: Pain at adequate level as identified by patient  Flowsheets (Taken 01/12/2023 0040)  Pain at adequate level as identified by patient:   Identify patient comfort function goal   Assess for risk of opioid induced respiratory depression, including snoring/sleep apnea. Alert healthcare team of risk factors identified.   Assess pain on admission, during daily assessment and/or before any "as needed" intervention(s)   Reassess pain within 30-60 minutes of any procedure/intervention, per Pain Assessment, Intervention, Reassessment (AIR) Cycle   Evaluate if patient comfort function goal is met   Evaluate patient's satisfaction with pain management progress   Offer non-pharmacological pain management interventions   Consult/collaborate with Pain Service   Consult/collaborate with Physical Therapy, Occupational Therapy, and/or Speech Therapy   Include patient/patient care companion in decisions related to pain management as needed     Problem: Side Effects from Pain Analgesia  Goal: Patient will experience minimal side effects of analgesic therapy  Flowsheets (Taken 01/12/2023 0040)  Patient will experience minimal side effects of analgesic therapy:   Monitor/assess patient's respiratory status (RR depth, effort, breath sounds)   Assess for changes in cognitive function   Prevent/manage side effects per LIP orders (i.e. nausea, vomiting, pruritus, constipation, urinary retention, etc.)   Evaluate for opioid-induced sedation with appropriate assessment tool (i.e. POSS)     Problem: Moderate/High Fall Risk Score >5  Goal: Patient will remain free of falls  Flowsheets (Taken 01/12/2023 0040)  High (Greater than 13):   HIGH-Visual cue at entrance to patient's room   HIGH-Utilize chair pad alarm for patient while in the chair   HIGH-Pharmacy to initiate evaluation and intervention per protocol   HIGH-Consider use of low bed   HIGH-Bed alarm on at all times while patient in  bed   HIGH-Apply yellow "Fall Risk" arm band   HIGH-Initiate use of floor mats as appropriate     Problem: Compromised Sensory Perception  Goal: Sensory Perception Interventions  Flowsheets (Taken 01/12/2023 0040)  Sensory Perception Interventions: Offload heels, Pad bony prominences, Reposition q 2hrs/turn Clock, Q2 hour skin assessment under devices if present     Problem: Compromised Moisture  Goal: Moisture level Interventions  Flowsheets (Taken 01/12/2023 0040)  Moisture level Interventions: Moisture wicking products, Moisture barrier cream     Problem: Compromised Activity/Mobility  Goal: Activity/Mobility Interventions  Flowsheets (Taken 01/12/2023 0040)  Activity/Mobility Interventions: Pad bony prominences, TAP Seated positioning system when OOB, Promote PMP, Reposition q 2 hrs / turn clock, Offload heels     Problem: Compromised Nutrition  Goal: Nutrition Interventions  Flowsheets (Taken 01/12/2023 0040)  Nutrition Interventions: Discuss nutrition at Rounds, I&Os, Document % meal eaten, Daily weights     Problem: Compromised Friction/Shear  Goal: Friction and Shear Interventions  Flowsheets (Taken 01/12/2023 0040)  Friction and Shear Interventions: Pad bony prominences, Off load heels, HOB 30 degrees or less unless contraindicated, Consider: TAP seated positioning, Heel foams

## 2023-01-12 NOTE — Anesthesia Preprocedure Evaluation (Addendum)
Anesthesia Evaluation    AIRWAY    Mallampati: II    TM distance: >3 FB  Neck ROM: full  Mouth Opening:full  Planned to use difficult airway equipment: No CARDIOVASCULAR    cardiovascular exam normal       DENTAL    no notable dental hx               PULMONARY    pulmonary exam normal     OTHER FINDINGS    NPO  Osteoperotic femoral neck fracture presents for closed reduction.   HTN                                      Relevant Problems   No relevant active problems               Anesthesia Plan    ASA 2     general                     intravenous induction   Detailed anesthesia plan: general endotracheal        Post op pain management: per surgeon and PNB single shot    informed consent obtained    Plan discussed with CRNA.                     Signed by: Renaldo Fiddler, MD 01/12/23 12:53 PM

## 2023-01-12 NOTE — Anesthesia Postprocedure Evaluation (Signed)
Anesthesia Post Evaluation    Patient: [name]    Procedure(s):  CLOSED REDUCTION, HIP, CANNULATED SCREWS    Anesthesia type: general    Last Vitals:   Vitals Value Taken Time   BP 160/77 01/12/23 1610   Temp 36.1 C (97 F) 01/12/23 1610   Pulse 64 01/12/23 1610   Resp 13 01/12/23 1610   SpO2 98 % 01/12/23 1610                 Anesthesia Post Evaluation:     Patient Evaluated: PACU  Patient Participation: complete - patient participated  Level of Consciousness: awake and alert  Pain Score: 0  Pain Management: adequate  Multimodal analgesia pain management approach  Strategies: NSAIDs  Airway Patency: patent  Two or more mitigation strategies used for obstructive sleep apnea.  Strategies: multimodal analgesia, awake extubation and verification of full NMB reversal    Anesthetic complications: No      PONV Status: none    Cardiovascular status: acceptable  Respiratory status: acceptable  Hydration status: acceptable          Signed by: Marlowe Aschoff, MD, 01/12/2023 5:13 PM

## 2023-01-13 ENCOUNTER — Encounter: Payer: Self-pay | Admitting: Student in an Organized Health Care Education/Training Program

## 2023-01-13 DIAGNOSIS — I10 Essential (primary) hypertension: Secondary | ICD-10-CM

## 2023-01-13 DIAGNOSIS — R030 Elevated blood-pressure reading, without diagnosis of hypertension: Secondary | ICD-10-CM

## 2023-01-13 LAB — BASIC METABOLIC PANEL
Anion Gap: 4 — ABNORMAL LOW (ref 5.0–15.0)
BUN: 23 mg/dL — ABNORMAL HIGH (ref 7–21)
CO2: 23 mEq/L (ref 17–29)
Calcium: 8.2 mg/dL (ref 7.9–10.2)
Chloride: 106 mEq/L (ref 99–111)
Creatinine: 0.9 mg/dL (ref 0.4–1.0)
GFR: 58.2 mL/min/{1.73_m2} — ABNORMAL LOW (ref >=60.0)
Glucose: 124 mg/dL — ABNORMAL HIGH (ref 70–100)
Potassium: 4.9 mEq/L (ref 3.5–5.3)
Sodium: 133 mEq/L — ABNORMAL LOW (ref 135–145)

## 2023-01-13 LAB — HEMOGLOBIN AND HEMATOCRIT
Hematocrit: 27.2 % — ABNORMAL LOW (ref 34.7–43.7)
Hemoglobin: 10.1 g/dL — ABNORMAL LOW (ref 11.4–14.8)

## 2023-01-13 MED ORDER — NALOXONE HCL 0.4 MG/ML IJ SOLN (WRAP)
0.4000 mg | INTRAMUSCULAR | Status: DC | PRN
Start: 2023-01-13 — End: 2023-01-14

## 2023-01-13 MED ORDER — CALCIUM CITRATE-VITAMIN D 315-6.25 MG-MCG PO TABS
2.0000 | ORAL_TABLET | Freq: Every day | ORAL | Status: DC
Start: 2023-01-13 — End: 2023-01-14
  Administered 2023-01-13 – 2023-01-14 (×2): 2 via ORAL
  Filled 2023-01-13 (×2): qty 2

## 2023-01-13 MED ORDER — AMLODIPINE BESYLATE 5 MG PO TABS
2.5000 mg | ORAL_TABLET | Freq: Every day | ORAL | Status: DC
Start: 2023-01-13 — End: 2023-01-14
  Administered 2023-01-13 – 2023-01-14 (×2): 2.5 mg via ORAL
  Filled 2023-01-13 (×2): qty 1

## 2023-01-13 MED ORDER — ONDANSETRON 4 MG PO TBDP
4.0000 mg | ORAL_TABLET | Freq: Three times a day (TID) | ORAL | Status: DC | PRN
Start: 2023-01-13 — End: 2023-01-14

## 2023-01-13 MED ORDER — ONDANSETRON HCL 4 MG/2ML IJ SOLN
4.0000 mg | Freq: Three times a day (TID) | INTRAMUSCULAR | Status: DC | PRN
Start: 2023-01-13 — End: 2023-01-14

## 2023-01-13 MED ORDER — BISACODYL 10 MG RE SUPP
10.0000 mg | Freq: Every day | RECTAL | Status: DC | PRN
Start: 2023-01-13 — End: 2023-01-14

## 2023-01-13 MED ORDER — VITAMINS/MINERALS PO TABS
1.0000 | ORAL_TABLET | Freq: Every day | ORAL | Status: DC
Start: 2023-01-13 — End: 2023-01-14
  Administered 2023-01-13 – 2023-01-14 (×2): 1 via ORAL
  Filled 2023-01-13 (×2): qty 1

## 2023-01-13 MED ORDER — MAGNESIUM HYDROXIDE 400 MG/5ML PO SUSP
10.0000 mL | Freq: Every day | ORAL | Status: DC | PRN
Start: 2023-01-13 — End: 2023-01-14

## 2023-01-13 MED ORDER — ACETAMINOPHEN 325 MG PO TABS
650.0000 mg | ORAL_TABLET | Freq: Four times a day (QID) | ORAL | Status: DC | PRN
Start: 2023-01-13 — End: 2023-01-13

## 2023-01-13 NOTE — Plan of Care (Signed)
Problem: Pain interferes with ability to perform ADL  Goal: Pain at adequate level as identified by patient  Flowsheets (Taken 01/13/2023 0028)  Pain at adequate level as identified by patient:   Identify patient comfort function goal   Assess for risk of opioid induced respiratory depression, including snoring/sleep apnea. Alert healthcare team of risk factors identified.   Assess pain on admission, during daily assessment and/or before any "as needed" intervention(s)   Reassess pain within 30-60 minutes of any procedure/intervention, per Pain Assessment, Intervention, Reassessment (AIR) Cycle   Evaluate if patient comfort function goal is met   Evaluate patient's satisfaction with pain management progress   Offer non-pharmacological pain management interventions   Consult/collaborate with Pain Service   Consult/collaborate with Physical Therapy, Occupational Therapy, and/or Speech Therapy   Include patient/patient care companion in decisions related to pain management as needed     Problem: Side Effects from Pain Analgesia  Goal: Patient will experience minimal side effects of analgesic therapy  Flowsheets (Taken 01/13/2023 0028)  Patient will experience minimal side effects of analgesic therapy:   Monitor/assess patient's respiratory status (RR depth, effort, breath sounds)   Assess for changes in cognitive function   Evaluate for opioid-induced sedation with appropriate assessment tool (i.e. POSS)   Prevent/manage side effects per LIP orders (i.e. nausea, vomiting, pruritus, constipation, urinary retention, etc.)     Problem: Moderate/High Fall Risk Score >5  Goal: Patient will remain free of falls  Flowsheets (Taken 01/13/2023 0028)  High (Greater than 13):   HIGH-Consider use of low bed   HIGH-Pharmacy to initiate evaluation and intervention per protocol   HIGH-Utilize chair pad alarm for patient while in the chair   HIGH-Visual cue at entrance to patient's room   HIGH-Bed alarm on at all times while patient in  bed   HIGH-Apply yellow "Fall Risk" arm band   HIGH-Initiate use of floor mats as appropriate     Problem: Compromised Sensory Perception  Goal: Sensory Perception Interventions  Flowsheets (Taken 01/13/2023 0028)  Sensory Perception Interventions: Offload heels, Pad bony prominences, Reposition q 2hrs/turn Clock, Q2 hour skin assessment under devices if present     Problem: Compromised Moisture  Goal: Moisture level Interventions  Flowsheets (Taken 01/13/2023 0028)  Moisture level Interventions: Moisture wicking products, Moisture barrier cream     Problem: Compromised Activity/Mobility  Goal: Activity/Mobility Interventions  Flowsheets (Taken 01/13/2023 0028)  Activity/Mobility Interventions: Pad bony prominences, TAP Seated positioning system when OOB, Promote PMP, Reposition q 2 hrs / turn clock, Offload heels     Problem: Compromised Nutrition  Goal: Nutrition Interventions  Flowsheets (Taken 01/13/2023 0028)  Nutrition Interventions: Discuss nutrition at Rounds, I&Os, Document % meal eaten, Daily weights     Problem: Compromised Friction/Shear  Goal: Friction and Shear Interventions  Flowsheets (Taken 01/13/2023 0028)  Friction and Shear Interventions: Pad bony prominences, Off load heels, HOB 30 degrees or less unless contraindicated, Consider: TAP seated positioning, Heel foams

## 2023-01-13 NOTE — Progress Notes (Signed)
Initial Case Management Assessment and Discharge Planning [facility]   Patient Name: EILIANA, DRONE   Date of Birth 1925/09/18   Attending Physician: Smith Mince, MD   Primary Care Physician: Hope Budds, MD   Length of Stay 2   Reason for Consult / Chief Complaint Closed fracture of neck of femur, unspecified laterality        Situation   Admission DX:   1. Closed nondisplaced basicervical fracture of right femur, initial encounter        A/O Status: Not Oriented    LACE Score: 7    Patient admitted from: ER  Admission Status: inpatient    Health Care Agent: Child  Name: Cy Blamer  Phone number: [phone]       Background     Advanced directive:   <no information>    has NO advance directive - not interested in additional information    Code Status:   NO CPR  -  ALLOW NATURAL DEATH     Residence: Assisted living    PCP: Hope Budds, MD  Patient Contact:   [phone] (home)     [phone] (mobile)     Emergency contact:   Extended Emergency Contact Information  Primary Emergency Contact: Phineas Real  Address: [address]           [place], [place] [address] [place]  Home Phone: [phone]  Mobile Phone: [phone]  Relation: Daughter  Interpreter needed? No      ADL/IADL's: Needs Assist  Previous Level of function: 3 Moderate Assist    DME: Rollator (4 wheeled walker)    Pharmacy:     [facility] [phone] [place], [place] - [address]  [address]  [place] [address]  Phone: [phone] Fax: [phone]      Prescription Coverage: Yes    Home Health: The patient is not currently receiving home health services.    Previous SNF/AR: No    COVID Vaccine Status: Vaccinated    Date First IMM given:   UAI on file?: No  Transport for discharge? Mode of transportation: Ambuance/Ambulet/Van  Agreeable to SNF: [facility]  post-discharge:  Yes     Assessment   CM spoke to patient's daughter, Cy Blamer, and explained CM  role.   Patient is not alert and oriented.   Patient's daughter confirmed patient's demographics.  Patient resides in ALF, [facility].   Patient's daughter informed CM that patient gets assistance with their ADLS/IADLs at [facility].   Patient's daughter is interested in SNF and would like for patient to transition to [facility] SNF. CM provided patient with a list of SNF referral that included [facility]. CM will follow up with patient's family on [facility] response.   Patient's daughter reported that they will need assistance with transport for patient once discharged.   Patient's daughter does not have any questions or concerns for this CM.   BARRIERS TO DISCHARGE: None     Recommendation   D/C Plan A: SNF    D/C Plan B: Assisted Living Facility       Eldridge Abrahams, MSW   Social Work Case Therapist, art [employer]  [phone]

## 2023-01-13 NOTE — Progress Notes (Signed)
NUTRITION:  Reason for Assessment: low BMI    Assessment / Recs:  Continue regular diet as tolerated.   Add Magic Cup BID with lunch and dinner.   Monitor weight trends and intakes daily.   Assist with meals as needed.     Clinical / Nutr-Related History:  [name] is a(n) 89 y.o. female admitted post unwitnessed fall from assisted living with nondisplaced R hip fx. Hx of dementia, osteoporosis, HTN. POD#1 percutaneous screw fixation. Patient seen at bedside with daughter and son in law. Patient was not able to answer questions due to HOH/dementia and then fell asleep. Per family, patient usually has good appetite since moving from apartment to assisted living. She has not eaten much since admission due to being NPO and surgery. Patient likes sweets so will order Magic Cup since patient does not like Ensure. Ate about 50% of breakfast with assistance per nursing. Unable to perform NFPE due to patient sleeping.     Labs:   Recent Labs   Lab 01/13/23  1135 01/12/23  0824 01/12/23  0504   Sodium 133*  More results in Results Review 135   Potassium 4.9  More results in Results Review 5.9*   Chloride 106  More results in Results Review 104   CO2 23  More results in Results Review 24   BUN 23*  More results in Results Review 18   Creatinine 0.9  More results in Results Review 0.7   Calcium 8.2  More results in Results Review 8.6   Albumin  --   --  3.0*   Protein, Total  --   --  6.1   Bilirubin, Total  --   --  0.7   Alkaline Phosphatase  --   --  99   ALT  --   --  16   AST (SGOT)  --   --  33   Glucose 124*  More results in Results Review 89   More results in Results Review = values in this interval not displayed.     No results found for: "HGBA1C"  Meds: Scheduled Medications[1]  Skin: intact   GI: No data recorded  Physical Assessment: unable to assess due to patient sleeping   I/O last 3 completed shifts:  In: 500 [I.V.:500]  Out: 40 [Blood:40]  I/O this shift:  In: -   Out: 300 [Urine:300]  Edema: BLE +1      Anthropometrics:  Height: 157.5 cm (5' 2.01")  Weight: 47.6 kg (105 lb)  Body mass index is 19.2 kg/m.   Weight Monitoring     Weight Weight Method   08/04/2022 47.628 kg  Actual    01/11/2023 47.628 kg  Stated    Weight stable.     Diet Order:   Orders Placed This Encounter   Procedures    Adult diet Regular     Estimated Nutrition Needs: using CBW 47.6kg  1200-1440 kcal (25-30 kcal/kg)  48-58 gm protein (1-1.2 g/kg pro)  Fluid per team     Monitor/Eval: Clinical progress, labs, GI, med/surgical tx plan, weights, skin      Waunita Schooner, RD   Clinical Dietitian  [phone]       [1]   Current Facility-Administered Medications   Medication Dose    acetaminophen  1,000 mg    amLODIPine  2.5 mg    calcium citrate-vitamin D  2 tablet    ceFAZolin  2 g    enoxaparin  40 mg  polyethylene glycol  17 g    valsartan  160 mg    vitamins/minerals  1 tablet

## 2023-01-13 NOTE — OT Eval Note (Signed)
Occupational Therapy Eval [name]        Post Acute Care Therapy Recommendations:     Discharge Recommendations:  SNF  If SNF  recommended discharge disposition is not available, patient will need 24h/7d/wk assist for ADLs and fxl mobility.     DME needs IF patient is discharging home: Patient already has needed equipment    Therapy discharge recommendations may change with patient status.  Please refer to most recent note for up-to-date recommendations.    Assessment:   Significant Findings: None; VSS post Eval    [name] is a 89 y.o. female admitted 01/11/2023.  Patient presents with limited participation in ADLs, decr activity tolerance, decr ROM & strength, impaired cognition, decr safety awareness, and limited fxl mobility. Per Family: Independent w/ rollator prior to admission; Hx of fall. Demo'd ADL & fxl mobility w/ mod to max A; Decr safety awareness w/ pt requiring max cues t/o. Pt would benefit from skilled OT to incr safety & participation in fxl activities and for improved QOL.    Impairments: Assessment: decreased ROM;decreased strength;balance deficits;decreased independence with ADLs;sensory impairment;decreased safety awareness;decreased cognition;decreased independence with IADLs;decreased endurance/activity tolerance    Therapy Diagnosis: Decr participation in ADLs; Limited fxl mobility & safety    Rehabilitation Potential: Prognosis: With continued OT s/p acute discharge     Treatment Activities: OT Eval; Wb'ing Precautions; Fxl Mobility; POC; ADLs; Safety    Educated the patient to role of occupational therapy, plan of care, goals of therapy and safety with mobility and ADLs, weight bearing precautions, home safety.    Plan:   OT Frequency Recommended: 3-4x/wk     Treatment Interventions: ADL retraining;Functional transfer training;UE strengthening/ROM;Endurance training;Cognitive reorientation;Patient/Family training;Equipment eval/education;Compensatory technique education      Risks/benefits/POC discussed w/ pt & family    Unit: [facility] TOWER 9  Bed: F945/F945.01       Precautions and Contraindications:   Precautions  Weight Bearing Status: RLE WBAT  Other Precautions: Falls; HOH    Consult received for [name] for OT Evaluation and Treatment.  Patient's medical condition is appropriate for Occupational Therapy intervention at this time.    Admitting Diagnosis: Closed fracture of neck of femur, unspecified laterality, initial encounter [S72.009A]  Closed nondisplaced basicervical fracture of right femur, initial encounter [S72.044A]    History of Present Illness:   Per Chart: [name] is a 89 y.o. female admitted on 01/11/2023 with history of dementia, osteoporosis and hypertension admitted post unwitnessed fall from assisted living with nondisplaced right hip fracture. CLOSED REDUCTION, HIP, CANNULATED SCREWS (Right).    Past Medical/Surgical History:  Medical History[1]    Past Surgical History:   Procedure Laterality Date    CLOSED REDUCTION, HIP, CANNULATED SCREWS Right 01/12/2023    Procedure: CLOSED REDUCTION, HIP, CANNULATED SCREWS;  Surgeon: Gregery Na, MD;  Location: [facility] OR;  Service: Orthopedics;  Laterality: Right;     Imaging/Tests/Labs:  Fluoroscopy less than 1 hour    Result Date: 01/12/2023   Fluoroscopic guidance provided without the presence of a radiologist. Gerlene Burdock, MD 01/12/2023 5:46 PM     Social History:   Prior Level of Function:  Prior level of function: Ambulates with assistive device, Needs assistance with ADLs  Assistive Device: Four wheel walker  Baseline Activity Level: Household ambulation  Dressing - Upper Body: minimal assist  Dressing - Lower Body: minimal assist  Feeding: independent  Bathing: minimal assist  Grooming: independent  Toileting: independent  DME  Currently at Home: ADL- Binnie Rail, ADL- Shower Chair, Environmental consultant, Four Wheel, Other (Comment) (Per Family: Facility has add equipment)    Home Living  Arrangements:  Living Arrangements: Alone, Other (Comment) (ALF- Staff assists PRN)  Type of Home: Assisted living  Home Layout: One level  Bathroom Shower/Tub: Pension scheme manager: Midwife: Grab bars in shower, Paediatric nurse, Grab bars around toilet  DME Currently at Home: ADL- Engineer, materials, ADL- Paediatric nurse, Environmental consultant, Four Wheel, Other (Comment) (Per Family: Facility has add equipment)  Home Living - Notes / Comments: Per Family: Pt requires assist w/ "some" ADLs including dressing & bathing; Able to toilet independently, but fell ambulating to bathroom    Subjective: "What'd you say?Marland Kitchen..I don't know what she said."   Patient is agreeable to participation in the therapy session.     Patient Goal: Per Family- To get better; To get up and moving    Pain:  Scale: Unable to rate ("I don't know")  Location: Back  Intervention: Rest    Objective:   Observation of Patient/Vital Signs:  Patient is in bed with dressings, peripheral IV, and female external catheter in place.  Pt wore mask during therapy session:No      Cognitive Status and Neuro Exam:  Cognition/Neuro Status  Orientation Level: Oriented to person  Following Commands: moderate verbal instruction  Safety Awareness: moderate verbal instruction  Insights: Decreased awareness of deficits      Musculoskeletal Examination  Gross ROM  Right Upper Extremity ROM: within functional limits  Left Upper Extremity ROM: within functional limits    Gross Strength  Right Upper Extremity Strength: 3+/5  Left Upper Extremity Strength: 3+/5      Sensory/Oculomotor Examination  Sensory  Auditory: hearing aid left;hearing aid right  Tactile - Light Touch: intact  Visual Acuity: intact;wears glasses     Activities of Daily Living  Self-care and Home Management  LB Dressing: Moderate Assist;edge of bed    Functional Mobility:  Mobility and Transfers  Scooting to EOB: Moderate Assist;Increased Time;Increased Effort  Supine to Sit: Moderate Assist (Required  incr assist to bring BLE towards EOB)  Sit to Stand: Moderate Assist;Increased Time;Increased Effort (Notable lean back)  Bed to Chair: Maximal Assist (Stand pivot using RW- Notable lean back; Required max cues & assist for RW mgmt & safety)     PMP Activity: Step 5 - Chair   Ed RN on stand pivot t/f to chair post tx and recommendation for add OOB mobility.     Balance  Balance  Static Sitting Balance: Stand by Assist  Dyanamic Sitting Balance: Contact Guard Assist  Static Standing Balance: Minimal Assist;Moderate Assist (Lean back)  Dynamic Standing Balance: Moderate Assist;Maximal Assist (Lean back- Unable to shift weight forward despite max cues)    Participation and Activity Tolerance  Participation and Endurance  Participation Effort: good  Endurance: Tolerates 10 - 20 min exercise with multiple rests    Patient left with call bell within reach, all needs met, SCDs off, fall mat on, bed alarm off, chair alarm on and all questions answered. RN notified of session outcome and patient response. Family in room t/o- Ed Family on chair and letting RN know when leave 2/2 safety.    Goals:  Time For Goal Achievement: 5 visits  ADL Goals  Patient will groom self: Supervision, at edge of bed  Patient will dress lower body: Stand by Assist, with AE  Patient will toilet: Minimal Assist  Mobility and Transfer  Goals  Pt will transfer bed to [facility]: Minimal Assist, with rolling walker           PPE worn during session: procedural mask and gloves    Tech present: No  PPE worn by tech: N/A and gloves    Mariah Milling, OT  01/13/2023  Pager: [phone]       Time of Treatment:   OT Received On: 01/13/23  Start Time: 1337  Stop Time: 1421  Time Calculation (min): 44 min       [1]   Past Medical History:  Diagnosis Date    HOH (hard of hearing)     Hypertension     Osteoporosis

## 2023-01-13 NOTE — Discharge Instr - AVS First Page (Addendum)
[facility]  [address]  [place], [place] [address]  Phone: [phone]  Fax [phone]    DISCHARGE INSTRUCTIONS:  Date of Admission: 01/11/2023   Admission Diagnosis: Closed fracture of neck of femur, unspecified laterality, initial encounter [S72.009A]  Closed nondisplaced basicervical fracture of right femur, initial encounter [S72.044A]   Date of Most Recent Surgery: 01/12/2023 Procedure(s) (LRB):  CLOSED REDUCTION, HIP, CANNULATED SCREWS (Right)    Your first appointment has been scheduled with Gregery Na, MD.   Future Appointments   Date Time Provider Department Center   01/28/2023  9:45 AM Gregery Na, MD [facility]    If you are unable to keep your appointment or need to make changes, please call the office as soon as possible.  - Please plan to arrive at least 15 minutes prior to your appointment time to allow yourself time for parking and check-in.   - If needed, we will obtain x-rays at your first orthopaedic follow-up appointment. X-rays do not need to be done prior to your appointment unless you have certain types of insurance or have been instructed to do so (if you have multiple injuries). If you have questions, please contact our office for guidance.  - If you underwent surgery, your incision will be examined at your first follow-up appointment and typically, staples/sutures will be removed.  - Please check with your insurance to see if a referral is needed prior to your first visit to avoid any delays.  - Your insurance is noted as: Payor: MEDICARE / Plan: MEDICARE PART A AND B / Product Type: Medicare / . Please contact our office if this needs to be changed.    Activity / Weight Bearing Status:  Weightbearing as tolerated on the Right Lower Extremity      Wound Care:  - If you notice increased drainage, incision redness, or foul smell from the incision/wound and/or develop a fever, please inform the office immediately and/or proceed  directly to the Emergency Department for prompt care.  - Soft Dressings (no splint or cast): You may shower 3 days after surgery (no baths or submerging incision) unless otherwise instructed. Do not scrub incision, and pat the incision dry with a clean towel.  You may change dressings as needed with clean gauze and tape or ACE bandages, unless given specific instructions.    Medications:  -In order to minimize narcotic use and establish better pain control, we employ a multimodal pain approach. Dosing of medications will vary from patient to patient based on your specific needs, allergies and past medical history, so please refer to your discharge medication list.   - Pain management protocol utilizes several medications all listed below:    - Tylenol: Tylenol (acetaminophen) can be obtained over-the-counter. It is important limit dosage to no more than 3000 mg in a 24 hr period. Patients with liver problems are advised to refrain from taking until discussed with primary care provider. Usual dosing for adults is 1000mg  every 8 hrs for the first 2-3 weeks, then as needed.  For children, we recommend weight based dosing as directed on the medication packaging.  Prior to adding Tylenol, please ensure that other pain medications do not contain tylenol.   - Gabapentin: Please take Gabapentin (neurontin) as instructed three times per day, since this medication works best when taken as scheduled, not as needed. Gabapentin should not be stopped abruptly as it may cause withdrawal. Use of this medication will help pain  control by a different mechanism than narcotics and also help you wean off the narcotics more quickly. Most patients will take this med for 2-4 weeks.  To wean off of medication, stop taking the mid-day dose for 3 days, then stop taking the Morning dose for 3 days, and finally, stop taking the evening dose.   - Narcotics: (Oxycodone/Dilaudid/Hydrocodone/Tramadol/etc.): This class of medications are prescribed  opioid medications and should only be taken as instructed. It is also important to be on a consistent bowel regimen (see below) when taking narcotic medications, as constipation is a common side effect. If prescribed a dose range, recommend taking the lowest dose for moderate pain and the higher dose for severe pain. Most patients will take these medications for about a week, possibly two weeks and Narcotics should be the first medication that you wean off of taking. It is important that you DO NOT operate a motor vehicle or make important decisions while taking these meds.  Finally, avoid consuming alcohol or other medications that are sedating or could affect your mental status while taking narcotics.  - Bowel Regimen: Take stool softeners (ex. Colace) as long as you are taking narcotic pain medicines. If you are unable to have a bowel movement every 2-3 days, please proceed with over the counter laxatives (Senokot -S/Miralax), enemas, or suppositories for constipation relief.  - NSAIDs: (Motrin, Advil, Ibuprofen, Naprosyn, Aleve) - *If you are enrolled in NSAID study, please follow study instructions above depending on if you are in YES NSAID arm of the study vs the NO NSAID arm of the study*.  NSAIDs can be found over the counter.  There are occasional fractures that are notorious for slow healing, in which case you will be advised against using this class of medications. Adults: Recommend Ibuprofen 600mg  every 8 hours for the first 2-3 weeks (taken with food) then you may wean as tolerated. Discontinue if you experience stomach upset. Children: Recommend using Motrin dosed per the child's weight as instructed on the packaging. If you have Kidney problems, DO NOT take this medication until discussing with your primary care provider.  These medicines cannot be taken if you are pregnant.   Stomach Ulcer Prevention: Ongoing use of NSAID medications can be very irritating to your stomach, potentially leading to  gastritis or ulcers.  While taking NSAID's, we recommend that you also take Nexium (Esomeprazole) 20mg  or Prevacid (Lansoprazole) 15 mg daily while on NSAID's, both of which are over the counter medications.  Blood Clot Prevention:  - You may have been prescribed a blood thinner such as Lovenox (Enoxaparin), Heparin, Aspirin, or Xarelto (Rivaroxaban) to reduce the risk of a blood clot (deep vein thrombosis; DVT).  The type of medicine and length or preventative treatment is based on you injury type and procedure performed.   - Typically for pelvis and hip fractures, lovenox is prescribed for 3 weeks followed by a baby aspirin (81 mg) by mouth twice per day for 3 more weeks.  - Typically for femur (thigh), knee, lower leg, ankle, and foot fractures, baby aspirin (81 mg) twice per day for 3 weeks is prescribed.  - Patients with additional injuries (head, chest, spine, abdomen), wheelchair bound for long periods of time, bleeding disorders, and other medical problems may have adjusted medications and doses.  - If you have been prescribed lovenox and are not able to fill it, we suggest that you take 1 baby aspirin (81 mg) by mouth twice daily (every 12 hours).  Diet:  -  Please resume your normal diet. It is common to have a decreased appetite for the first 1-2 weeks or following your injury/surgery. This may be due to pain and the pain medications that you are taking. Please be sure to drink plenty of fluids during this time to remain hydrated.     X-Rays (Radiology Studies):   - If you obtain x-rays (or MRI or CT scans) anywhere other than our office, please make sure to bring the actual images (on a CD) to the office visit. A radiology report is not adequate - your provider must visualize the actual images to accurately determine the plan of care.   - Prior to your first clinic visit, please call your insurance and/or our office to ensure that you have insurance coverage to obtain x-rays in our office. If you are  unable to obtain them in our office at your clinic visit, we will assist you in finding an imaging facility that accepts your healthcare plan.    Physical/Occupational Therapy:  - Physical or occupational therapy may be prescribed for rehabilitation during your recovery. The [facility] are all part of the same healthcare system as [facility] and our office. These centers accept most health insurance plans, but we encourage checking with your insurance plan to ensure appropriate coverage.    We hope that you have experienced excellent care from our team at [facility]. We continuously strive to improve and offer state of the art and world class care to our patients.  If you would like to express your gratitude or make a donation to support [facility]'s efforts, please contact us at: [phone] or [email or web address].    For emotional support resources after your traumatic injury, please contact the [facility] Coordinator, Adele Schilder, at [phone] or [email or web address]       [facility] MyChart - Take control of your medical records    MyChart allows you immediate, secure and confidential access to your medical records.  Patients like you use MyChart's Electronic Medical Record for better access to:  Appointment Scheduling - Make an appointment with your existing provider and manage your appointments.  After-Visit Summaries - View a summary of your appointments with instructions and comments from your provider.  Communicate with Your Provider's Team - Correspond directly with your provider about your health or refill a prescription anywhere/anytime.      Sign up using three easy steps:   Please go to [email or web address]           Click on Sign up now to access the new member sign up page  Enter your unique MyChart Access Code exactly as it appears below to complete the sign-up process.  If you  experience issues with activating your account, the information you have provided does not completely match our records. Please contact your provider's office or any Hospital Registration Department to correct the information required.   MyChart Access Code: [email or web address]  Expires: 02/27/2023  8:39 AM      Having technical trouble with MyChart?   Call and select "4" to speak to a representative.  [phone])

## 2023-01-13 NOTE — Progress Notes (Signed)
Medicine Shift Note    Braden Scale Score: 18 (01/13/23 0820)  Skin Assessment: Blanchable Redness, Bruising, Surgical incision  Bruising Skin Location: scattered  Patient Lines/Drains/Airways Status       Active Lines, Drains and Airways       Name Placement date Placement time Site Days    Peripheral IV 01/12/23 22 G Standard Anterior;Proximal;Right Forearm 01/12/23  2130  Forearm  less than 1    Peripheral IV 01/12/23 22 G Anterior;Distal;Right Forearm 01/12/23  2135  Forearm  less than 1                   Wound 01/12/23 Leg Right (Active)   Date First Assessed/Time First Assessed: 01/12/23 1533   Wound Type: Surgical Incision  Location: Leg  Wound Location Orientation: Right      Assessments 01/12/2023  3:30 PM 01/12/2023  4:10 PM   Wound Base Description Dressing Covering Site (UTA) Dressing Covering Site (UTA)   Drainage Amount None None   Dressing Gauze;Transparent Film Gauze;Transparent Film       No associated orders.       Skin  4 eyes in 4 hours pressure injury assessment note:      Completed with:             Bony Prominences: Check appropriate box; if wound is present enter wound assessment in LDA      Occiput:                 [x] WNL  []  Wound present  Face:                     [x] WNL  []  Wound present  Ears:                      [x] WNL  []  Wound present  Spine:                    [x] WNL  []  Wound present  Shoulders:             [x] WNL  []  Wound present  Elbows:                  [x] WNL  []  Wound present  Sacrum/coccyx:     [x] WNL  []  Wound present  Ischial Tuberosity:  [x] WNL  []  Wound present  Trochanter/Hip:      [x] WNL  []  Wound present  Knees:                   [x] WNL  []  Wound present  Ankles:                   [x] WNL  []  Wound present  Heels:                    [x] WNL  []  Wound present  Other pressure areas:  [x]  Wound location    If wound is present in any of the following from above, please describe findings below:         Device related: []  Device name:         LDA completed if wound present:  yes/no    Consult WOCN: yes/no    Other skin related issues, ie tears, rash, etc, document in Integumentary flowsheet      Last BM: PTA    Pending Orders: labs     Discharge Plan: TBD     POC: pt and family  at the bedside     Tele: N/A    Activity: bed mobility x2v     PT/OT: 7/22 rec SNF    Interpreter Needs: N/A    Intake & Output for the shift: No intake/output data recorded.    Shift Note: pt is A&Ox1, All medication are given. Scheduled and PRN pain medication given with positive effect. Reposition and incontinence care are provided. All safety and fall precaution in place; purposely rounding maintain. Call light within reach.

## 2023-01-13 NOTE — Nursing Progress Note (Signed)
Medicine Shift Note    Braden Scale Score: 18 (01/12/23 2130)  Skin Assessment: Blanchable Redness  Bruising Skin Location: scattered  Patient Lines/Drains/Airways Status       Active Lines, Drains and Airways       Name Placement date Placement time Site Days    Peripheral IV 01/12/23 22 G Standard Anterior;Proximal;Right Forearm 01/12/23  2130  Forearm  less than 1    Peripheral IV 01/12/23 22 G Anterior;Distal;Right Forearm 01/12/23  2135  Forearm  less than 1                   Wound 01/12/23 Leg Right (Active)   Date First Assessed/Time First Assessed: 01/12/23 1533   Wound Type: Surgical Incision  Location: Leg  Wound Location Orientation: Right      Assessments 01/12/2023  3:30 PM 01/12/2023  4:10 PM   Wound Base Description Dressing Covering Site (UTA) Dressing Covering Site (UTA)   Drainage Amount None None   Dressing Gauze;Transparent Film Gauze;Transparent Film       No associated orders.       Skin  4 eyes in 4 hours pressure injury assessment note:      Completed with:             Bony Prominences: Check appropriate box; if wound is present enter wound assessment in LDA      Occiput:                 [x] WNL  []  Wound present  Face:                     [x] WNL  []  Wound present  Ears:                      [x] WNL  []  Wound present  Spine:                    [x] WNL  []  Wound present  Shoulders:             [x] WNL  []  Wound present  Elbows:                  [x] WNL  []  Wound present  Sacrum/coccyx:     [x] WNL  []  Wound present  Ischial Tuberosity:  [x] WNL  []  Wound present  Trochanter/Hip:      [x] WNL  []  Wound present  Knees:                   [x] WNL  []  Wound present  Ankles:                   [x] WNL  []  Wound present  Heels:                    [x] WNL  []  Wound present  Other pressure areas:  []  Wound location    If wound is present in any of the following from above, please describe findings below:         Device related: []  Device name:         LDA completed if wound present: yes/no    Consult WOCN:  yes/no    Other skin related issues, ie tears, rash, etc, document in Integumentary flowsheet      Last BM: PTA    Pending Orders: labs    Discharge Plan: TBD    POC: Patient    Tele: no  Activity: x2 assist bed mobility     PT/OT: n/a    Interpreter Needs:     Intake & Output for the shift: No intake/output data recorded.    Shift Note: Patient is alert and oriented to self only. Denied any pain or discomfort. All scheduled meds given and tolerated well. All safety precautions in place, call bell within reach.

## 2023-01-13 NOTE — Progress Note - Problem Oriented Charting Notewrit (Addendum)
Orthopedic Trauma Daily Progress Note    01/13/2023 8:00 AM    [name] is a 89 y.o. female who is now status post right hip closed reduction percutaneous pinning for a femoral neck fracture .     Subjective: NAEON. Patient Denies nausea, vomiting, or fevers.     Physical Exam:  Vitals:    01/13/23 0756   BP: 166/77   Pulse: 71   Resp: 16   Temp: 98.1 F (36.7 C)   SpO2: 95%        Intake/Output Summary (Last 24 hours) at 01/13/2023 0800  Last data filed at 01/12/2023 1527  Gross per 24 hour   Intake 500 ml   Output 40 ml   Net 460 ml     Lab Results   Component Value Date    INR 1.1 01/11/2023       right lower extremity:  Skin Mild ecchymosis proximally  Dressings clean/dry/intact  +TA,GS  +EHL,FHL  Sensation intact light touch dorsal/plantar foot  Cap refill < 2 seconds  Pedal pulses palpable  No pain with passive ROM digits    Lab Results   Component Value Date    HGB  01/11/2023      Comment:      Not measured. Unable to report due to interfering substance.  This is a corrected result. Previous result was 10.9 g/dL on 07/24/8655 at 8469 EDT      Lab Results   Component Value Date    HCT 35.0 01/11/2023    Review of pertinent labs demonstrate:  Pending Hgb this morning     Assessment: 89 y.o. female now 1 Day Post-Op s/p R hpi CRPP for a non displaced femoral neck fracture     Plan:   - Mobility: Out of bed as tolerated with PT/OT   - Pain control: Continue to wean/titrate to appropriate oral regimen   - DVT Prophylaxis (per Ortho Trauma service Protocol): - Lovenox for 3 months while immobilized in wheelchair (may convert to ASA in several weeks)   - Foley catheter status: Does not have Foley   - Further surgical plans: No further Orthopaedic plans    - RUE: WBAT   - LUE:  WBAT   - RLE:  WBAT   - LLE:  WBAT   - Currently hemodynamically stable. Will continue to monitor serial pertinent labs.   - Disposition: Pending PT eval    Danella Maiers, MD    Attending Addendum/Attestation:     I have personally  seen and examined this patient and have participated in their care. I agree with the clinical information, including the physical exam and patient history as documented by the Resident/Advanced Practice Provider.  The above note has been edited to reflect my findings as well as incorporate supplementary history and exam that I have obtained.  Using all of the relevant patient information, I have performed and documented the Medical Decision Making in its entirety.    Chevis Pretty, MD  Orthopaedic Trauma  Pager [phone]  Office [phone]

## 2023-01-13 NOTE — PT Eval Note (Signed)
Physical Therapy Evaluation  [name]      Post Acute Care Therapy Recommendations:     Discharge Recommendations:  SNF    If SNF  recommended discharge disposition is not available, patient will need hands on assist for all functional mobility, transport home and HHPT.     DME needs IF patient is discharging home: Front wheel walker, Wheelchair-manual    Therapy discharge recommendations may change with patient status.  Please refer to most recent note for up-to-date recommendations.    Assessment:   Significant Findings: none    [name] is a 89 y.o. female admitted 01/11/2023. Pt received in bed and was agreeable to PT. Pt oriented to name and DOB, able to state she is in the hospital but cannot recall why. Frequent re-orientation to situation provided during session. Pt required min-mod A for bed mobility and transfers today. Pt performs 2 stands at EOB, posterior lean requiring hands on assist to correct. Pt presents with dec functional strength, delayed balance strategies, gait impairment, cognitive dysfunction, dec endurance/activity tolerance, dec bed mobility, dec transfer mobility, and pain limiting participation/functional mobility which limits her functional mobility. Rec SNF. Pt not at functional baseline, would benefit from continued acute PT to optimize return to PLOF.    Therapy Diagnosis: impaired functional mobility    Impairments: Assessment: Decreased UE strength;Decreased LE strength;Decreased safety/judgement during functional mobility;Decreased cognition;Decreased endurance/activity tolerance;Decreased functional mobility;Decreased balance;Gait impairment.     Rehabilitation Potential: Prognosis: Good;With continued PT status post acute discharge    Treatment Activities: PT evaluation, bed mobility, sit <> stand, balance, ther ex, transfers, energy conservations techniques, patient education  Educated the patient to role of physical therapy, plan of care, goals of therapy and  HEP, safety with mobility and ADLs, energy conservation techniques, discharge instructions, home safety.    Plan:   Treatment/Interventions: Exercise, Gait training, Functional transfer training, LE strengthening/ROM, Endurance training, Cognitive reorientation, Patient/family training, Equipment eval/education, Bed mobility     PT Frequency: 3-4x/wk   Risks/Benefits/POC Discussed with Pt/Family: With patient     Unit: [facility] TOWER 9  Bed: F945/F945.01       Precautions and Contraindications:   Weight Bearing Status: RLE WBAT  Other Precautions: falls, HOH    Consult received for [name] for PT Evaluation and Treatment.  Patient's medical condition is appropriate for Physical Therapy intervention at this time.    History of Present Illness:   [name] is a 89 y.o. female admitted on 01/11/2023 with "history of dementia, osteoporosis and hypertension admitted post unwitnessed fall from assisted living with nondisplaced right hip fracture. " Per chart    Medical Diagnosis: Closed fracture of neck of femur, unspecified laterality, initial encounter [S72.009A]  Closed nondisplaced basicervical fracture of right femur, initial encounter [S72.044A]    Past Medical/Surgical History:  Medical History[1]  Past Surgical History:   Procedure Laterality Date    CLOSED REDUCTION, HIP, CANNULATED SCREWS Right 01/12/2023    Procedure: CLOSED REDUCTION, HIP, CANNULATED SCREWS;  Surgeon: Gregery Na, MD;  Location: [facility] TOWER OR;  Service: Orthopedics;  Laterality: Right;       X-Rays/Tests/Labs:  Rad: reviewed 7/22  Fluoroscopy less than 1 hour    Result Date: 01/12/2023   Fluoroscopic guidance provided without the presence of a radiologist. Gerlene Burdock, MD 01/12/2023 5:46 PM    US Venous Low Extrem Duplx Dopp Comp Bilat    Result Date: 01/11/2023   No sonographic evidence for right or  left lower extremity deep venous thrombosis. Marilu Favre, MD 01/11/2023 1:08 PM    XR Hip right 2-3 vw with  pelvis    Result Date: 01/11/2023   1.No acute fractures detected on radiographs. Diffuse osteopenia. Given the CT report findings, recommend further evaluation with bony pelvic MRI. Emeline Darling, MD 01/11/2023 12:48 PM    CT Pelvis without Contrast (Boney Pelvis)    Result Date: 01/11/2023  1.Osseous demineralization lowering sensitivity for evaluation of nondisplaced fractures. Within confines of limitations, subtle lucency is seen within the right femoral neck worrisome for a nondisplaced fracture. MRI is recommended. 2.Limited evaluation of the left hip secondary to streak artifact and motion. No obvious fractures are identified. However, an asymmetric soft tissue contusion is seen in the left flank. If the patient has left hip pain, consider MRI of the left hip as well. Urgent results were discussed with and acknowledged by Idell Pickles, MD on 01/11/2023 2:53 AM. Campbell Stall 01/11/2023 2:54 AM    CT Head without Contrast    Result Date: 01/11/2023   No evidence of acute intracranial abnormality. Susy Frizzle, MD 01/11/2023 2:53 AM   Lab Results   Component Value Date/Time    HGB  01/11/2023 03:11 AM      Comment:      Not measured. Unable to report due to interfering substance.  This is a corrected result. Previous result was 10.9 g/dL on 07/12/1476 at 2956 EDT    HCT 35.0 01/11/2023 03:11 AM    K 4.3 01/12/2023 08:24 AM    NA 136 01/12/2023 08:24 AM    INR 1.1 01/11/2023 03:11 AM     Social History:   Prior Level of Function:  Prior level of function: Ambulates with assistive device, Needs assistance with ADLs  Assistive Device: Four wheel walker  Baseline Activity Level: Household ambulation  DME Currently at Home: Environmental consultant, Four Wheel    Home Living Arrangements:  Living Arrangements: Alone  Type of Home: Assisted living ([place] [place])  Home Layout: One level  DME Currently at Home: Dan Humphreys, Four [employer]  Home Living - Notes / Comments: pt poor historian, PLOF/living arrangements gathered from chart    Subjective:    "How old am I? Can you remind me?"  Patient's medical condition is appropriate for Physical Therapy intervention at this time. Patient is agreeable to participation in the therapy session. Nursing clears patient for therapy.    Patient Goal: to feel better    Pain Assessment  Pain Assessment: PAINAD     PAINAD  Breathing, Independent of vocalization: Normal  Negative Vocalization: None  Facial Expression: Sad, frightened, frown  Body Language: Relaxed  Consolability: No need to console  PAINAD Score: 1    Objective:   Observation of Patient/Vital Signs:  Patient is in bed with dressings and female external catheter in place.      Pt wore mask during therapy session:No      Vital signs:  Stable with no signs/symptoms of distress    Cognition/Neuro Status:  Cognition/Neuro Status  Arousal/Alertness: Appropriate responses to stimuli  Attention Span: Appears intact  Orientation Level: Oriented to person;Disoriented to time;Disoriented to situation;Oriented to place  Memory: Decreased recall of recent events;Decreased long term memory;Decreased short term memory;Decreased recall of biographical information  Following Commands: Follows one step commands without difficulty  Safety Awareness: moderate verbal instruction  Insights: Not aware of deficits  Behavior: calm;cooperative            Musculoskeletal Examination:  Gross ROM  Right Upper Extremity ROM: within functional limits  Left Upper Extremity ROM: within functional limits  Right Lower Extremity ROM: within functional limits  Left Lower Extremity ROM: within functional limits    Gross Strength  Right Upper Extremity Strength: within functional limits  Left Upper Extremity Strength: within functional limits  Right Lower Extremity Strength: 3-/5  Left Lower Extremity Strength: 4/5         Functional Mobility:  Supine to Sit: Minimal Assist;Increased Time;Increased Effort;using bedrail;HOB raised;to Left  Scooting to HOB: Dependent  Scooting to EOB: Maximal  Assist  Sit to Supine: Maximal Assist  Sit to Stand: Moderate Assist  Stand to Sit: Moderate Assist     Ambulation:  PMP - Progressive Mobility Protocol   PMP Activity: Step 4 - Dangle at Bedside     Ambulation: Not performed      Balance:  Balance: needs focused assessment  Sitting - Static: Good  Sitting - Dynamic: Fair  Standing - Static: Fair  Standing - Dynamic: Fair       Sensation:  Right UE: intact   Left UE: intact   Right LE: intact   Left LE: intact     Therapeutic Exercise:   Incorporated throughout session    Participation and Activity Tolerance:  Participation Effort: good  Endurance: Tolerates 10 - 20 min exercise with multiple rests    Patient left with call bell within reach, all needs met,   SCD's off (as found)  Floor mat in place  Bed alarm on  and all questions answered. RN notified of session outcome and patient response.     Goals:  Goals  Goal Formulation: With patient  Time for Goal Acheivement: 5 visits  Goals: Select goal  Pt Will Go Supine To Sit: with contact guard assist  Pt Will Perform Sit To Supine: with contact guard assist  Pt Will Perform Sit to Stand: with minimal assist  Pt Will Transfer Bed/Chair: with rolling walker, with minimal assist  Pt Will Ambulate: 31-50 feet, with rolling walker, with minimal assist    Time of Treatment  PT Received On: 01/13/23  Start Time: 0935  Stop Time: 1010  Time Calculation (min): 35 min    PPE worn during session: gloves, procedural mask       Rudi Heap, PT, DPT  Available via Epic Secure Chat  Pager [phone]       [1]   Past Medical History:  Diagnosis Date    HOH (hard of hearing)     Hypertension     Osteoporosis

## 2023-01-13 NOTE — Progress Notes (Signed)
MEDICINE CONSULT FOLLOW-UP    Date Time: 01/13/23 7:53 AM  Patient Name: [name]  Attending Physician: Smith Mince, MD  Consulting Physician: Burman Freestone, MD, MD      Assessment:   Principal Problem:    Closed fracture of neck of femur, unspecified laterality, initial encounter  Active Problems:    Closed nondisplaced basicervical fracture of right femur, initial encounter  Resolved Problems:    * No resolved hospital problems. *      89 year old female with history of dementia, osteoporosis and hypertension admitted post unwitnessed fall from assisted living with nondisplaced right hip fracture.     INTERVAL:  7/21 - for OR --> perc screw fixation  BP remains elevated despite continuing home ARB (valsartan substituted for home irbesartan).    SUBJECTIVE: Patient reports "I feel fine"      Recommendations:   # Nondisplaced right hip fracture  -s/p  percutaneous screw fixation POD1    # Hypertension, persistently elevated blood pressure despite valsartan and despite pain already relieved  -Continue valsartan  - Add low dose amlodipine 2.5mg  qd, hold for sbp<120     # Spurious hyperkalemia  - Normal on repeat    # Osteoporosis  -Already on outpatient medical treatment which will be continued post discharge    Updated daughter on phone  No barrier to [place] planning from IM perspective    Physical Exam:   Patient Vitals for the past 24 hrs:   BP Temp Temp src Pulse Resp SpO2   01/13/23 0317 143/67 98.2 F (36.8 C) Axillary -- 17 91 %   01/12/23 2353 144/81 98.3 F (36.8 C) Oral 91 16 93 %   01/12/23 2243 92/55 -- -- -- 16 --   01/12/23 2242 98/62 97.7 F (36.5 C) Oral 82 16 93 %   01/12/23 2001 112/65 98.2 F (36.8 C) Oral 94 19 92 %   01/12/23 1625 151/66 97.5 F (36.4 C) Oral (!) 106 20 90 %   01/12/23 1610 160/77 97 F (36.1 C) Skin 64 13 98 %   01/12/23 1600 154/65 -- -- 67 18 98 %   01/12/23 1550 169/81 -- -- 68 12 94 %   01/12/23 1540 154/67 -- -- 68 17 100 %   01/12/23 1532 176/70 97 F  (36.1 C) Temporal 67 14 100 %   01/12/23 1530 171/72 97 F (36.1 C) Skin 65 16 100 %   01/12/23 1308 (!) 204/81 98.7 F (37.1 C) Temporal 72 20 94 %   01/12/23 1249 185/45 -- -- -- -- --   01/12/23 1223 (!) 197/95 -- -- -- -- --   01/12/23 1112 181/70 97.5 F (36.4 C) Axillary 62 20 93 %   01/12/23 0859 178/72 -- -- -- -- --     Body mass index is 19.2 kg/m.    Intake/Output Summary (Last 24 hours) at 01/13/2023 0753  Last data filed at 01/12/2023 1527  Gross per 24 hour   Intake 500 ml   Output 40 ml   Net 460 ml       General: awake, alert, oriented x 3; no acute distress.  Cardiovascular: regular rate and rhythm  Lungs: clear to auscultation bilaterally, without wheezing, rhonchi, or rales  Abdomen: soft, non-tender, non-distended; no palpable masses, no hepatosplenomegaly, normoactive bowel sounds, no rebound or guarding  Extremities: Rt upper thigh incision clean and dry, mild swelling      Meds:     Medications were reviewed:  Current Facility-Administered Medications   Medication Dose Route Frequency    acetaminophen  1,000 mg Oral 4 times per day    ceFAZolin  2 g Intravenous Q8H    enoxaparin  40 mg Subcutaneous Q24H SCH    polyethylene glycol  17 g Oral Daily    valsartan  160 mg Oral Daily     Infusion Meds[1]  PRN Medications[2]      Labs:     Recent Labs     01/11/23  0311   WBC 6.25   Hematocrit 35.0   Platelet Count 172       Recent Labs     01/12/23  0824 01/12/23  0504   Sodium 136 135   Potassium 4.3 5.9*   Chloride 106 104   CO2 23 24   BUN 19 18   Creatinine 0.7 0.7   Glucose 95 89   Calcium 8.7 8.6       Recent Labs     01/12/23  0504 01/11/23  0311   AST (SGOT) 33 21   ALT 16 14   Alkaline Phosphatase 99 95   Protein, Total 6.1 5.7*   Albumin 3.0* 3.0*       Recent Labs     01/11/23  0311   PT 12.0   INR 1.1       Imaging personally reviewed, including: NA          Case discussed with: family and pt        Signed by: Burman Freestone, MD, MD  Burman Freestone, MD, FACP  [facility] Adult Hospitalist  Pager  [phone]  Office: [phone]  Spectralink [phone]  [name]                       [1]   Current Facility-Administered Medications   Medication Dose Route Frequency Last Rate   [2]   Current Facility-Administered Medications   Medication Dose Route    hydrALAZINE  5 mg Intravenous    oxyCODONE  2.5 mg Oral

## 2023-01-14 DIAGNOSIS — I10 Essential (primary) hypertension: Secondary | ICD-10-CM | POA: Insufficient documentation

## 2023-01-14 DIAGNOSIS — F0390 Unspecified dementia without behavioral disturbance: Secondary | ICD-10-CM | POA: Insufficient documentation

## 2023-01-14 DIAGNOSIS — M810 Age-related osteoporosis without current pathological fracture: Secondary | ICD-10-CM | POA: Insufficient documentation

## 2023-01-14 LAB — BASIC METABOLIC PANEL
Anion Gap: 7 (ref 5.0–15.0)
BUN: 27 mg/dL — ABNORMAL HIGH (ref 7–21)
CO2: 26 mEq/L (ref 17–29)
Calcium: 9.1 mg/dL (ref 7.9–10.2)
Chloride: 104 mEq/L (ref 99–111)
Creatinine: 0.8 mg/dL (ref 0.4–1.0)
GFR: >60 mL/min/{1.73_m2} (ref >=60.0)
Glucose: 115 mg/dL — ABNORMAL HIGH (ref 70–100)
Potassium: 4.2 mEq/L (ref 3.5–5.3)
Sodium: 137 mEq/L (ref 135–145)

## 2023-01-14 LAB — LAB USE ONLY - CBC WITH DIFFERENTIAL
Absolute Basophils: 0.03 10*3/uL (ref 0.00–0.08)
Absolute Eosinophils: 0.1 10*3/uL (ref 0.00–0.44)
Absolute Immature Granulocytes: 0.05 10*3/uL (ref 0.00–0.07)
Absolute Lymphocytes: 1.44 10*3/uL (ref 0.42–3.22)
Absolute Monocytes: 0.75 10*3/uL (ref 0.21–0.85)
Absolute Neutrophils: 6.63 10*3/uL — ABNORMAL HIGH (ref 1.10–6.33)
Absolute nRBC: 0 10*3/uL (ref <=0.00)
Basophils %: 0.3 %
Eosinophils %: 1.1 %
Hematocrit: 29.4 % — ABNORMAL LOW (ref 34.7–43.7)
Hemoglobin: 9.6 g/dL — ABNORMAL LOW (ref 11.4–14.8)
Immature Granulocytes %: 0.6 %
Lymphocytes %: 16 %
MCH: 29.8 pg (ref 25.1–33.5)
MCHC: 32.7 g/dL (ref 31.5–35.8)
MCV: 91.3 fL (ref 78.0–96.0)
MPV: 10.2 fL (ref 8.9–12.5)
Monocytes %: 8.3 %
Neutrophils %: 73.7 %
Platelet Count: 208 10*3/uL (ref 142–346)
Preliminary Absolute Neutrophil Count: 6.63 10*3/uL — ABNORMAL HIGH (ref 1.10–6.33)
RBC: 3.22 10*6/uL — ABNORMAL LOW (ref 3.90–5.10)
RDW: 14 % (ref 11–15)
WBC: 9 10*3/uL (ref 3.10–9.50)
nRBC %: 0 /100 WBC (ref <=0.0)

## 2023-01-14 MED ORDER — OXYCODONE HCL 5 MG PO TABS: 2.5000 mg | ORAL_TABLET | ORAL | 0 refills | Status: AC | PRN

## 2023-01-14 MED ORDER — ENOXAPARIN SODIUM 40 MG/0.4ML IJ SOSY
40.0000 mg | PREFILLED_SYRINGE | INTRAMUSCULAR | 0 refills | Status: AC
Start: 2023-01-14 — End: ?

## 2023-01-14 MED ORDER — ACETAMINOPHEN 500 MG PO TABS
1000.0000 mg | ORAL_TABLET | Freq: Four times a day (QID) | ORAL | Status: AC
Start: 2023-01-14 — End: ?

## 2023-01-14 MED ORDER — SENNOSIDES-DOCUSATE SODIUM 8.6-50 MG PO TABS
1.0000 | ORAL_TABLET | Freq: Every day | ORAL | Status: AC
Start: 2023-01-14 — End: ?

## 2023-01-14 NOTE — Progress Notes (Signed)
Patient is medically cleared to discharge to SNF, [facility]. Patient will be in room EV205 and nurse report is [phone]. Patient will be transported by MMT.        01/14/23 1324   Discharge Disposition   Patient preference/choice provided? Yes   Physical Discharge Disposition SNF   Was patient sent with auth pending to the post-acute facility? No   Receiving facility, unit and room number: [facility], Room EV205   Nursing report phone number: [phone]   Mode of Transportation Ambulance   Pick up time 1:00pm   Patient/Family/POA notified of transfer plan Yes   Patient agreeable to discharge plan/expected d/c date? Yes   Family/POA agreeable to discharge plan/expected d/c date? Yes   Bedside nurse notified of transport plan? Yes   Hard copy of narcotic RX sent with patient? Yes   Hard copy of DNR/Advance Directive sent with patient? N/A   Advanced directive/code status rescreen completed before discharge?  No   IV antibiotics post discharge? N/A   Wound care post discharge? N/A   CM Interventions   Follow up appointment scheduled? Yes   Is this a Medicare focused or COVID patient? No   Is this appointment within 48-72 hours? No   Multidisciplinary rounds/family meeting before d/c? Yes   Medicare Checklist   Is this a Medicare patient? Yes   Patient received 1st IMM Letter? No   If LOS 3 days or greater, did patient received 2nd IMM Letter? Yes   Date of 2nd IMM Letter 01/14/23   Time of 2nd IMM letter 1309       Eldridge Abrahams, MSW   Social Work Case Therapist, art [employer]  [phone]

## 2023-01-14 NOTE — PT Progress Note (Signed)
Physical Therapy Treatment  [name]  Post Acute Care Therapy Recommendations:     Discharge Recommendations:  SNF    If SNF  recommended discharge disposition is not available, patient will need hands on assist for all functional mobility, transport into home and HHPT.     DME needs IF patient is discharging home: Front wheel walker, Wheelchair-manual    Therapy discharge recommendations may change with patient status.  Please refer to most recent note for up-to-date recommendations.    Assessment:   Significant Findings: none    Pt received in bed, agreeable to PT treatment session. Pt required min A for bed mobility, mod-max A for transfers and few steps. Pt perseverative on return to home requiring constant redirection to task and re-orientation to place/situation. Multiple repetitions required for safe transfer to chair due to pt distractibility.  Pt will continue to benefit from skilled physical therapy in the acute care setting to address noted limitations and progress towards PLOF.    Assessment: Decreased UE strength, Decreased LE strength, Decreased safety/judgement during functional mobility, Decreased endurance/activity tolerance, Decreased cognition, Decreased functional mobility, Decreased balance, Gait impairment  Progress: Progressing toward goals  Prognosis: Good, With continued PT status post acute discharge  Risks/Benefits/POC Discussed with Pt/Family: With patient  Patient left without needs and call bell within reach. RN notified of session outcome.     Treatment Activities: bed mobility, sit <> stand, gait, AD eval/training, ther ex, transfers, energy conservations techniques, patient education    Educated the patient to role of physical therapy, plan of care, goals of therapy and HEP, safety with mobility and ADLs, energy conservation techniques, weight bearing precautions.    Plan:   Treatment/Interventions: Exercise, Gait training, Functional transfer training, LE strengthening/ROM,  Endurance training, Cognitive reorientation, Patient/family training, Equipment eval/education, Bed mobility        PT Frequency: 3-4x/wk     Continue plan of care.    Unit: [facility] TOWER 9  Bed: F945/F945.01     Precautions and Contraindications:   Precautions  Weight Bearing Status: RLE WBAT  Other Precautions: Falls; HOH    Updated Medical Status/Imaging/Labs:   No results found.  Lab Results   Component Value Date/Time    HGB 9.6 (L) 01/14/2023 03:57 AM    HCT 29.4 (L) 01/14/2023 03:57 AM    K 4.2 01/14/2023 03:57 AM    NA 137 01/14/2023 03:57 AM    INR 1.1 01/11/2023 03:11 AM       Subjective: "I need to go get my shoes"   Patient Goal: to feel better    Pain Assessment  Pain Assessment: No/denies pain          Patient's medical condition is appropriate for Physical Therapy intervention at this time.  Patient is agreeable to participation in the therapy session. Family and/or guardian are agreeable to patient's participation in the therapy session. Nursing clears patient for therapy.    Objective:   Observation of Patient/Vital Signs:  Patient is in bed with dressings and female external catheter in place.     Pt wore mask during therapy session:No      Cognition/Neuro Status  Arousal/Alertness: Appropriate responses to stimuli  Attention Span: Appears intact  Orientation Level: Oriented to person;Disoriented to time;Disoriented to situation;Oriented to place  Memory: Decreased recall of recent events;Decreased long term memory;Decreased short term memory;Decreased recall of biographical information  Following Commands: Follows one step commands with increased time;Follows one step commands with repetition  Safety  Awareness: maximal verbal instruction  Insights: Not aware of deficits  Behavior: cooperative;distractable;perseverative          Functional Mobility:  Supine to Sit: Minimal Assist;using bedrail;to Right  Scooting to EOB: Maximal Assist  Sit to Stand: Moderate Assist  Stand to  Sit: Maximal Assist (for safe arrival to chair/bed)    Ambulation:  PMP - Progressive Mobility Protocol   PMP Activity: Step 5 - Chair     Ambulation: Moderate Assist;with front-wheeled walker  Pattern:  (side stepping to chair)   -max cuing required to sequence. Pt repeatedly attempting to walk forward, hold onto front of walker.    Therapeutic Exercise  Sit<>stands x 4 w mod-max A    Participation and Activity Tolerance:  Patient Participation: good  Patient Endurance: fair    Patient left with call bell within reach, all needs met,   SCD's off (as found)  Floor mat in place  Chair alarm in place  and all questions answered. RN notified of session outcome and patient response.     Goals:  Goals  Goal Formulation: With patient  Time for Goal Acheivement: 5 visits  Goals: Select goal  Pt Will Go Supine To Sit: with contact guard assist  Pt Will Perform Sit To Supine: with contact guard assist  Pt Will Perform Sit to Stand: with minimal assist  Pt Will Transfer Bed/Chair: with rolling walker, with minimal assist  Pt Will Ambulate: 31-50 feet, with rolling walker, with minimal assist    PPE worn during session: gloves, procedural mask    Tech present: no  PPE worn by tech: N/A      Time of Treatment  PT Received On: 01/14/23  Start Time: 1035  Stop Time: 1103  Time Calculation (min): 28 min  Treatment # 1 out of 5 visits    Rudi Heap, PT, DPT  Available via [employer]  Pager [phone]

## 2023-01-14 NOTE — Progress Note - Problem Oriented Charting Notewrit (Signed)
Orthopedic Trauma Daily Progress Note    01/14/2023 5:32 AM    [name] is a 89 y.o. female who is now status post right hip closed reduction percutaneous pinning for a femoral neck fracture .     Subjective: NAEON. Patient Denies nausea, vomiting, or fevers.     Physical Exam:  Vitals:    01/14/23 0351   BP: 145/78   Pulse: 100   Resp: 16   Temp: 98.8 F (37.1 C)   SpO2: 95%        Intake/Output Summary (Last 24 hours) at 01/14/2023 0532  Last data filed at 01/14/2023 0011  Gross per 24 hour   Intake --   Output 1050 ml   Net -1050 ml     Lab Results   Component Value Date    INR 1.1 01/11/2023       right lower extremity:  Skin Mild ecchymosis proximally  Surgical incision well approximated, no active drainage   +TA,GS  +EHL,FHL  Sensation intact light touch dorsal/plantar foot  Cap refill < 2 seconds  Pedal pulses palpable  No pain with passive ROM digits    Lab Results   Component Value Date    HGB 10.1 (L) 01/13/2023    HGB  01/11/2023      Comment:      Not measured. Unable to report due to interfering substance.  This is a corrected result. Previous result was 10.9 g/dL on 09/23/270 at 5366 EDT      Lab Results   Component Value Date    HCT 27.2 (L) 01/13/2023    HCT 35.0 01/11/2023    Review of pertinent labs demonstrate:  Pending Hgb this morning     Assessment: 89 y.o. female now 2 Days Post-Op s/p R hip CRPP for a non displaced femoral neck fracture     Plan:   - Mobility: Out of bed as tolerated with PT/OT   - Pain control: Continue to wean/titrate to appropriate oral regimen   - DVT Prophylaxis (per Ortho Trauma service Protocol): - Lovenox for 3 months while immobilized in wheelchair (may convert to ASA in several weeks)   - Foley catheter status: Does not have Foley   - Further surgical plans: No further Orthopaedic plans    - RUE: WBAT   - LUE:  WBAT   - RLE:  WBAT   - LLE:  WBAT   - Currently hemodynamically stable. Will continue to monitor serial pertinent labs.   - Disposition:  SNF    Danella Maiers, MD

## 2023-01-14 NOTE — Progress Notes (Signed)
CMU Discharge Note    Discharge to: [facility] room EV205    Family present during discharge: daughter LOYALTY, ARENTZ    Belonging: bilateral hearing aid and case, 2 pairs of glasses with 2 cases, pants, slippers taken with pt's daughter    Discharge education to: Phineas Real (daughter), and pt's son-in-law    Discharging equipments: N/A    Discharging medication: N/A    IV: removed    Discharged Via: stretcher, ambulance by: MMT    Discharge Note: Pt A&O to self, VSS on RA, no tele. Scheduled meds given per [facility] and tolerated well. Pt assessed to be experiencing mild pain in R leg using PAINAD scale, that was managed with repositioning, cold therapy, and scheduled tylenol with good effect. Discharge education given to pt's daughter and son-in-law who verbalized understanding with all questions answered. All personal belongings taken with pt's daughter. Report called to [facility] and given to MMT personnel.

## 2023-01-14 NOTE — Plan of Care (Signed)
Problem: Pain interferes with ability to perform ADL  Goal: Pain at adequate level as identified by patient  Outcome: Progressing  Flowsheets (Taken 01/14/2023 0019)  Pain at adequate level as identified by patient:   Identify patient comfort function goal   Consult/collaborate with Physical Therapy, Occupational Therapy, and/or Speech Therapy     Problem: Side Effects from Pain Analgesia  Goal: Patient will experience minimal side effects of analgesic therapy  Outcome: Progressing  Flowsheets (Taken 01/14/2023 0019)  Patient will experience minimal side effects of analgesic therapy: Monitor/assess patient's respiratory status (RR depth, effort, breath sounds)     Problem: Moderate/High Fall Risk Score >5  Goal: Patient will remain free of falls  Outcome: Progressing  Flowsheets (Taken 01/13/2023 2020)  High (Greater than 13):   HIGH-Visual cue at entrance to patient's room   HIGH-Bed alarm on at all times while patient in bed   HIGH-Utilize chair pad alarm for patient while in the chair   HIGH-Apply yellow "Fall Risk" arm band   HIGH-Initiate use of floor mats as appropriate   HIGH-Consider use of low bed     Problem: Compromised Sensory Perception  Goal: Sensory Perception Interventions  Outcome: Progressing  Flowsheets (Taken 01/14/2023 0019)  Sensory Perception Interventions: Offload heels, Pad bony prominences, Reposition q 2hrs/turn Clock, Q2 hour skin assessment under devices if present     Problem: Compromised Moisture  Goal: Moisture level Interventions  Outcome: Progressing  Flowsheets (Taken 01/14/2023 0019)  Moisture level Interventions: Moisture wicking products, Moisture barrier cream     Problem: Compromised Activity/Mobility  Goal: Activity/Mobility Interventions  Outcome: Progressing  Flowsheets (Taken 01/14/2023 0019)  Activity/Mobility Interventions: Pad bony prominences, TAP Seated positioning system when OOB, Promote PMP, Reposition q 2 hrs / turn clock, Offload heels     Problem: Compromised  Nutrition  Goal: Nutrition Interventions  Outcome: Progressing  Flowsheets (Taken 01/14/2023 0019)  Nutrition Interventions: Discuss nutrition at Rounds, I&Os, Document % meal eaten, Daily weights     Problem: Compromised Friction/Shear  Goal: Friction and Shear Interventions  Outcome: Progressing  Flowsheets (Taken 01/14/2023 0019)  Friction and Shear Interventions: Pad bony prominences, Off load heels, HOB 30 degrees or less unless contraindicated, Consider: TAP seated positioning, Heel foams

## 2023-01-14 NOTE — Progress Notes (Signed)
Patient is medically cleared to discharge to SNF, [facility]. Patient will be in room EV205 and nurse report is [phone]. Patient will be transported by MMT.     Eldridge Abrahams, MSW   Social Work Case Therapist, [employer]  [phone]

## 2023-01-14 NOTE — Discharge Summary (Signed)
Discharge Summary    Date Time: 01/14/23 9:48 AM  Patient Name: [name]  Attending Physician: Smith Mince, MD  Service:  Orthopedic trauma team     Date of Admission:   01/11/2023    Date of Discharge:        Reason for Admission:   Closed fracture of neck of femur, unspecified laterality, initial encounter [S72.009A]  Closed nondisplaced basicervical fracture of right femur, initial encounter [S72.044A]    Problems:   Lists the present on admission hospital problems  Present on Admission:   Closed fracture of neck of femur, unspecified laterality, initial encounter   Closed nondisplaced basicervical fracture of right femur, initial encounter      Hospital Problems:  Principal Problem:    Closed fracture of neck of femur, unspecified laterality, initial encounter  Active Problems:    Closed nondisplaced basicervical fracture of right femur, initial encounter      Discharge Dx:     1. Closed nondisplaced basicervical fracture of right femur, initial encounter        Consultations:   Internal Medicine:      Procedures performed:   Right hip closed reduction percutaneous pinning     HPI:   [name] is 89 y.o. female that presents to the hospital with a right non displaced femoral neck fracture after an unwitnessed ground level fall     Hospital Course:      At the time of discharge the patient was afebrile and her vital signs were within normal limits. she was ambulatory and able to void spontaneously without any difficulty.  she was tolerating a diet and her pain was well-controlled with oral medication.  Discharge Medications:        Medication List        START taking these medications      acetaminophen 500 MG tablet  Commonly known as: TYLENOL  Take 2 tablets (1,000 mg) by mouth every 6 (six) hours     enoxaparin 40 MG/0.4ML syringe  Commonly known as: LOVENOX  Inject 0.4 mLs (40 mg) into the skin every 24 hours     oxyCODONE 5 MG immediate release tablet  Commonly known as:  ROXICODONE  Take 0.5-1 tablets (2.5-5 mg) by mouth every 4 (four) hours as needed for Pain Take Colace to prevent constipation     senna-docusate 8.6-50 MG per tablet  Commonly known as: PERICOLACE  Take 1 tablet by mouth daily            CONTINUE taking these medications      glycerin (ADULT) 2 g Supp  Place 1 suppository rectally daily as needed (constipation)     raloxifene 60 MG tablet  Commonly known as: EVISTA     telmisartan 80 MG tablet  Commonly known as: MICARDIS     vitamin D 25 MCG (1000 UT) tablet  Commonly known as: CHOLECALCIFEROL            STOP taking these medications      aspirin EC 81 MG EC tablet               Where to Get Your Medications        You can get these medications from any pharmacy    Bring a paper prescription for each of these medications  enoxaparin 40 MG/0.4ML syringe  oxyCODONE 5 MG immediate release tablet       Information about where to get these medications is not yet available  Ask your nurse or doctor about these medications  acetaminophen 500 MG tablet  senna-docusate 8.6-50 MG per tablet         Disposition:   SNF     Discharge Instructions:     [facility] MEDICINE  [address], [address]  [place], [place] [address]  Phone: [phone]  Fax [phone]    DISCHARGE INSTRUCTIONS:  Date of Admission: 01/11/2023   Admission Diagnosis: Closed fracture of neck of femur, unspecified laterality, initial encounter [S72.009A]  Closed nondisplaced basicervical fracture of right femur, initial encounter [S72.044A]   Date of Most Recent Surgery: 01/12/2023 Procedure(s) (LRB):  CLOSED REDUCTION, HIP, CANNULATED SCREWS (Right)    Please contact the office as soon as possible to schedule a follow-up appointment with Gregery Na, MD. Bonita Quin will need to be seen approximately 2 weeks from the date of your surgery.   - Please plan to arrive at least 15 minutes prior to your appointment time to allow yourself time for parking and check-in.   - If needed, we will obtain  x-rays at your first orthopaedic follow-up appointment. X-rays do not need to be done prior to your appointment unless you have certain types of insurance or have been instructed to do so (if you have multiple injuries). If you have questions, please contact our office for guidance.  - If you underwent surgery, your incision will be examined at your first follow-up appointment and typically, staples/sutures will be removed.  - Please check with your insurance to see if a referral is needed prior to your first visit to avoid any delays.  - Your insurance is noted as: Payor: MEDICARE / Plan: MEDICARE PART A AND B / Product Type: Medicare / . Please contact our office if this needs to be changed.    Activity / Weight Bearing Status:  Weightbearing as tolerated on the Right Lower Extremity      Wound Care:  - If you notice increased drainage, incision redness, or foul smell from the incision/wound and/or develop a fever, please inform the office immediately and/or proceed directly to the Emergency Department for prompt care.  - Soft Dressings (no splint or cast): You may shower 3 days after surgery (no baths or submerging incision) unless otherwise instructed. Do not scrub incision, and pat the incision dry with a clean towel.  You may change dressings as needed with clean gauze and tape or ACE bandages, unless given specific instructions.    Medications:  -In order to minimize narcotic use and establish better pain control, we employ a multimodal pain approach. Dosing of medications will vary from patient to patient based on your specific needs, allergies and past medical history, so please refer to your discharge medication list.   - Pain management protocol utilizes several medications all listed below:    - Tylenol: Tylenol (acetaminophen) can be obtained over-the-counter. It is important limit dosage to no more than 3000 mg in a 24 hr period. Patients with liver problems are advised to refrain from taking until  discussed with primary care provider. Usual dosing for adults is 1000mg  every 8 hrs for the first 2-3 weeks, then as needed.  For children, we recommend weight based dosing as directed on the medication packaging.  Prior to adding Tylenol, please ensure that other pain medications do not contain tylenol.   - Gabapentin: Please take Gabapentin (neurontin) as instructed three times per day, since this medication works best when taken as scheduled, not as needed. Gabapentin  should not be stopped abruptly as it may cause withdrawal. Use of this medication will help pain control by a different mechanism than narcotics and also help you wean off the narcotics more quickly. Most patients will take this med for 2-4 weeks.  To wean off of medication, stop taking the mid-day dose for 3 days, then stop taking the Morning dose for 3 days, and finally, stop taking the evening dose.   - Narcotics: (Oxycodone/Dilaudid/Hydrocodone/Tramadol/etc.): This class of medications are prescribed opioid medications and should only be taken as instructed. It is also important to be on a consistent bowel regimen (see below) when taking narcotic medications, as constipation is a common side effect. If prescribed a dose range, recommend taking the lowest dose for moderate pain and the higher dose for severe pain. Most patients will take these medications for about a week, possibly two weeks and Narcotics should be the first medication that you wean off of taking. It is important that you DO NOT operate a motor vehicle or make important decisions while taking these meds.  Finally, avoid consuming alcohol or other medications that are sedating or could affect your mental status while taking narcotics.  - Bowel Regimen: Take stool softeners (ex. Colace) as long as you are taking narcotic pain medicines. If you are unable to have a bowel movement every 2-3 days, please proceed with over the counter laxatives (Senokot -S/Miralax), enemas, or  suppositories for constipation relief.  - NSAIDs: (Motrin, Advil, Ibuprofen, Naprosyn, Aleve) - *If you are enrolled in NSAID study, please follow study instructions above depending on if you are in YES NSAID arm of the study vs the NO NSAID arm of the study*.  NSAIDs can be found over the counter.  There are occasional fractures that are notorious for slow healing, in which case you will be advised against using this class of medications. Adults: Recommend Ibuprofen 600mg  every 8 hours for the first 2-3 weeks (taken with food) then you may wean as tolerated. Discontinue if you experience stomach upset. Children: Recommend using Motrin dosed per the child's weight as instructed on the packaging. If you have Kidney problems, DO NOT take this medication until discussing with your primary care provider.  These medicines cannot be taken if you are pregnant.   Stomach Ulcer Prevention: Ongoing use of NSAID medications can be very irritating to your stomach, potentially leading to gastritis or ulcers.  While taking NSAID's, we recommend that you also take Nexium (Esomeprazole) 20mg  or Prevacid (Lansoprazole) 15 mg daily while on NSAID's, both of which are over the counter medications.  Blood Clot Prevention:  - You may have been prescribed a blood thinner such as Lovenox (Enoxaparin), Heparin, Aspirin, or Xarelto (Rivaroxaban) to reduce the risk of a blood clot (deep vein thrombosis; DVT).  The type of medicine and length or preventative treatment is based on you injury type and procedure performed.   - Typically for pelvis and hip fractures, lovenox is prescribed for 3 weeks followed by a baby aspirin (81 mg) by mouth twice per day for 3 more weeks.  - Typically for femur (thigh), knee, lower leg, ankle, and foot fractures, baby aspirin (81 mg) twice per day for 3 weeks is prescribed.  - Patients with additional injuries (head, chest, spine, abdomen), wheelchair bound for long periods of time, bleeding disorders, and  other medical problems may have adjusted medications and doses.  - If you have been prescribed lovenox and are not able to fill it, we suggest that  you take 1 baby aspirin (81 mg) by mouth twice daily (every 12 hours).     Diet:  -  Please resume your normal diet. It is common to have a decreased appetite for the first 1-2 weeks or following your injury/surgery. This may be due to pain and the pain medications that you are taking. Please be sure to drink plenty of fluids during this time to remain hydrated.     X-Rays (Radiology Studies):   - If you obtain x-rays (or MRI or CT scans) anywhere other than our office, please make sure to bring the actual images (on a CD) to the office visit. A radiology report is not adequate - your provider must visualize the actual images to accurately determine the plan of care.   - Prior to your first clinic visit, please call your insurance and/or our office to ensure that you have insurance coverage to obtain x-rays in our office. If you are unable to obtain them in our office at your clinic visit, we will assist you in finding an imaging facility that accepts your healthcare plan.    Physical/Occupational Therapy:  - Physical or occupational therapy may be prescribed for rehabilitation during your recovery. The [facility] are all part of the same healthcare system as [facility] and our office. These centers accept most health insurance plans, but we encourage checking with your insurance plan to ensure appropriate coverage.    We hope that you have experienced excellent care from our team at [facility]. We continuously strive to improve and offer state of the art and world class care to our patients.  If you would like to express your gratitude or make a donation to support [facility]'s efforts, please contact us at: [phone] or [email or web address].    For emotional support resources after your traumatic injury, please  contact the [facility] Coordinator, Adele Schilder, at [phone] or [email or web address]       [facility] MyChart - Take control of your medical records    MyChart allows you immediate, secure and confidential access to your medical records.  Patients like you use MyChart's Electronic Medical Record for better access to:  Appointment Scheduling - Make an appointment with your existing provider and manage your appointments.  After-Visit Summaries - View a summary of your appointments with instructions and comments from your provider.  Communicate with Your Provider's Team - Correspond directly with your provider about your health or refill a prescription anywhere/anytime.      Sign up using three easy steps:   Please go to [email or web address]           Click on Sign up now to access the new member sign up page  Enter your unique MyChart Access Code exactly as it appears below to complete the sign-up process.  If you experience issues with activating your account, the information you have provided does not completely match our records. Please contact your provider's office or any Hospital Registration Department to correct the information required.   MyChart Access Code: [email or web address]  Expires: 02/27/2023  8:39 AM      Having technical trouble with MyChart?   Call and select "4" to speak to a representative.  [phone]      Follow Up:   Hope Budds, MD  [address]  [place] [address]  [phone]    Follow up        Signed by: Danella Maiers, MD  At the time of discharge the patient was given instructions detailing discharge care and time (30 minutes) was taken to answer questions.

## 2023-01-14 NOTE — Progress Notes (Signed)
MEDICINE CONSULT FOLLOW-UP    No barrier to discharge from an IM standpoint.  Continue home regimen medications as previously prescribed.  Repeat lab work at follow on facility ~3-5 days.  Med Rec and AVS updated accordingly.     HPI and Hospital Course:     89 y/o female with PMHx dementia, osteoporosis, HTN who sustained an unwitnessed fall resulting in nondisplaced right femoral neck fracture, admitted to orthopedics 01/11/23, cleared for surgery, brought to the OR on 7/21, S/p closed reduction, cannulated screws, EBL 40ml.  Post op course uneventful, doing well post op. Afebrile, stable VS.  H&H stable.  Voiding freely, baseline mentation. She was medically cleared for discharge.  Continue home regimen medications as previously prescribed.  Repeat lab work at follow on facility ~3-5 days.      Assessment/Recommendations:     #R femoral neck Fx, S/p closed reduction, cannulated screws, EBL 40ml (7/21)   Management per primary  Lovenox ppx   PT/OT --> SNF    #Mild acute blood loss anemia 2/2 expected surgical loss- stable  Repeat CBC at follow on facility    #Mild hyponatremia, likely 2/2 post op SIADH (133)- Resolved    #Dementia-  #HTN- continue home regimen Rx    Review of Systems:   Review of Systems -Unable to fully obtain 2/2 dementia    Physical Exam:   Patient Vitals for the past 24 hrs:   BP Temp Temp src Pulse Resp SpO2   01/14/23 0706 173/90 97.3 F (36.3 C) Axillary 91 -- 94 %   01/14/23 0351 145/78 98.8 F (37.1 C) Oral 100 16 95 %   01/13/23 2300 187/73 97.9 F (36.6 C) Oral 66 20 95 %   01/13/23 1856 148/68 97.9 F (36.6 C) Oral 81 16 95 %   01/13/23 1624 149/56 98.4 F (36.9 C) Oral 62 16 93 %   01/13/23 1031 123/62 -- -- -- -- --   01/13/23 0756 166/77 98.1 F (36.7 C) Oral 71 16 95 %     Body mass index is 19.2 kg/m.    Intake/Output Summary (Last 24 hours) at 01/14/2023 0745  Last data filed at 01/14/2023 0011  Gross per 24 hour   Intake --   Output 1050 ml   Net -1050 ml       General:  awake, alert, NAD, resting comfortably.  Cardiovascular: RRR no murmurs  Lungs: CTAB, without wheezing  Abdomen: soft, non-tender, non-distended;  BS+  Extremities: no clubbing, cyanosis, or edema  Surgical dressing dry/intact      Meds:     Medications were reviewed:  Current Facility-Administered Medications   Medication Dose Route Frequency    acetaminophen  1,000 mg Oral 4 times per day    amLODIPine  2.5 mg Oral Daily    calcium citrate-vitamin D  2 tablet Oral Daily    enoxaparin  40 mg Subcutaneous Q24H SCH    polyethylene glycol  17 g Oral Daily    valsartan  160 mg Oral Daily    vitamins/minerals  1 tablet Oral Daily     Infusion Meds[1]  PRN Medications[2]       Labs:     Recent Labs     01/13/23  1720   Hemoglobin 10.1*   Hematocrit 27.2*       Recent Labs     01/14/23  0357 01/13/23  1135   Sodium 137 133*   Potassium 4.2 4.9   Chloride 104 106   CO2  26 23   BUN 27* 23*   Creatinine 0.8 0.9   Glucose 115* 124*   Calcium 9.1 8.2       Recent Labs     01/12/23  0504   AST (SGOT) 33   ALT 16   Alkaline Phosphatase 99   Protein, Total 6.1   Albumin 3.0*       No results for input(s): "PTT", "PT", "INR" in the last 72 hours.    Imaging personally reviewed, including:     Fluoroscopy less than 1 hour    Result Date: 01/12/2023   Fluoroscopic guidance provided without the presence of a radiologist. Gerlene Burdock, MD 01/12/2023 5:46 PM    US Venous Low Extrem Duplx Dopp Comp Bilat    Result Date: 01/11/2023   No sonographic evidence for right or left lower extremity deep venous thrombosis. Marilu Favre, MD 01/11/2023 1:08 PM    XR Hip right 2-3 vw with pelvis    Result Date: 01/11/2023   1.No acute fractures detected on radiographs. Diffuse osteopenia. Given the CT report findings, recommend further evaluation with bony pelvic MRI. Emeline Darling, MD 01/11/2023 12:48 PM    CT Pelvis without Contrast (Boney Pelvis)    Result Date: 01/11/2023  1.Osseous demineralization lowering sensitivity for evaluation of nondisplaced  fractures. Within confines of limitations, subtle lucency is seen within the right femoral neck worrisome for a nondisplaced fracture. MRI is recommended. 2.Limited evaluation of the left hip secondary to streak artifact and motion. No obvious fractures are identified. However, an asymmetric soft tissue contusion is seen in the left flank. If the patient has left hip pain, consider MRI of the left hip as well. Urgent results were discussed with and acknowledged by Idell Pickles, MD on 01/11/2023 2:53 AM. Campbell Stall 01/11/2023 2:54 AM    CT Head without Contrast    Result Date: 01/11/2023   No evidence of acute intracranial abnormality. Susy Frizzle, MD 01/11/2023 2:53 AM        Case discussed with: Pt, RN, medicine attending, Ortho team        Signed by: Lenon Oms, DNP NP  Internal Medicine, Hospitalist  Pager [phone]        Attending Attestation:   I have seen and examined the patient separately from the APP.  I have discussed this patient with APP, Greer Pickerel and together we have formulated the Assessment & Plan that is clearly dictated on this note by the APP.  I have edited when necessary.  I have performed the substantive Medical Decision-Making portion of this encounter.      Denice Paradise, MD                    [1]   Current Facility-Administered Medications   Medication Dose Route Frequency Last Rate   [2]   Current Facility-Administered Medications   Medication Dose Route    bisacodyl  10 mg Rectal    hydrALAZINE  5 mg Intravenous    magnesium hydroxide  10 mL Oral    naloxone  0.4 mg Intravenous    ondansetron  4 mg Oral    Or    ondansetron  4 mg Intravenous    oxyCODONE  2.5 mg Oral

## 2023-01-14 NOTE — Discharge Summary (Signed)
Orthopaedic Discharge Summary    Orthopaedic assessment:  [name] is a 89 y.o. female who is now POD 2 from right femoral neck closed reduction percutaneous pinning completed on 01/07/23.    Weightbearing: Weightbearing as tolerated   Follow-up: Gregery Na, MD     Janyce Llanos, FNP  Department of Orthopaedics  Ortho On-Call [facility]  [phone]  Ortho On-Call Pager  [phone]        HPI    [name] is a 89 y.o. year old female. She  has a past medical history of HOH (hard of hearing), Hypertension, and Osteoporosis.Marland Kitchen    Hospital Course  Patient was admitted on 01/11/2023 to  with a diagnosis of Closed fracture of neck of femur, unspecified laterality, initial encounter [S72.009A]  Closed nondisplaced basicervical fracture of right femur, initial encounter [S72.044A]. In the emergency room,  the patient was seen by the orthopaedic resident. A medicine consult was obtained and the patient was cleared for surgery.  The patient was electively admitted to the Orthopaedic service.   She was taken to the operating room for Procedure(s) (LRB):  CLOSED REDUCTION, HIP, CANNULATED SCREWS (Right) with Dr. Gregery Na, MD. Perioperative antibiotics were administered, the patient tolerated the procedure well and was transfered to the PACU in stable condition. Postoperatively the patient was made weight bearing as tolerated Left Lower Extremity. The patient worked well with physical therapy and was recommended for discharge to skilled nursing facility. The patient's pain was well controlled throughout the entire admission.  The patient is now in good condition..      Past Medical and Surgical History   Medical History[1]   Past Surgical History:   Procedure Laterality Date    CLOSED REDUCTION, HIP, CANNULATED SCREWS Right 01/12/2023    Procedure: CLOSED REDUCTION, HIP, CANNULATED SCREWS;  Surgeon: Gregery Na, MD;  Location: [facility];  Service: Orthopedics;  Laterality: Right;     Past Social  History   Social History[2]  Family History   History reviewed. No pertinent family history.  Reviewed and not contributory to this presentation.    Review of Systems:   All other systems were reviewed and are negative except: musculoskeletal,  as noted above            Medications upon discharge      Medication List      START taking these medications     acetaminophen 500 MG tablet; Commonly known as: TYLENOL; Take 2 tablets   (1,000 mg) by mouth every 6 (six) hours   enoxaparin 40 MG/0.4ML syringe; Commonly known as: LOVENOX; Inject 0.4   mLs (40 mg) into the skin every 24 hours   oxyCODONE 5 MG immediate release tablet; Commonly known as: ROXICODONE;   Take 0.5-1 tablets (2.5-5 mg) by mouth every 4 (four) hours as needed for   Pain Take Colace to prevent constipation   senna-docusate 8.6-50 MG per tablet; Commonly known as: PERICOLACE; Take   1 tablet by mouth daily     CONTINUE taking these medications     glycerin (ADULT) 2 g Supp; Place 1 suppository rectally daily as needed   (constipation)   raloxifene 60 MG tablet; Commonly known as: EVISTA   telmisartan 80 MG tablet; Commonly known as: MICARDIS   vitamin D 25 MCG (1000 UT) tablet; Commonly known as: CHOLECALCIFEROL     STOP taking these medications     aspirin EC 81 MG EC tablet       Allergies  No Known Allergies    BMI       Patient has BMI=Body mass index is 19.2 kg/m.  Diagnosis: Cachexia       Hemodynamics  Lab Results   Component Value Date    HGB 9.6 (L) 01/14/2023    HGB 10.1 (L) 01/13/2023    HGB  01/11/2023      Comment:      Not measured. Unable to report due to interfering substance.  This is a corrected result. Previous result was 10.9 g/dL on 7/82/9562 at 1308 EDT      Lab Results   Component Value Date    HCT 29.4 (L) 01/14/2023    HCT 27.2 (L) 01/13/2023    HCT 35.0 01/11/2023     During the current admission, the patient demonstrated evidence of a clinically significant drop (>2.0 in hemoglobin and/or >6 hematocrit). Serial hematocrit  and hemoglobin levels were monitored and treated appropriately (please see daily progress notes for interventions), involving co-managing teams in treatment plan.                                                          SURGERIES    Procedure(s):  CLOSED REDUCTION, HIP, CANNULATED SCREWS    2 Days Post-Op  -------------------                                                 INPATIENT IMAGING  Fluoroscopy less than 1 hour    Result Date: 01/12/2023   Fluoroscopic guidance provided without the presence of a radiologist. Gerlene Burdock, MD 01/12/2023 5:46 PM    US Venous Low Extrem Duplx Dopp Comp Bilat    Result Date: 01/11/2023   No sonographic evidence for right or left lower extremity deep venous thrombosis. Marilu Favre, MD 01/11/2023 1:08 PM    XR Hip right 2-3 vw with pelvis    Result Date: 01/11/2023   1.No acute fractures detected on radiographs. Diffuse osteopenia. Given the CT report findings, recommend further evaluation with bony pelvic MRI. Emeline Darling, MD 01/11/2023 12:48 PM    CT Pelvis without Contrast (Boney Pelvis)    Result Date: 01/11/2023  1.Osseous demineralization lowering sensitivity for evaluation of nondisplaced fractures. Within confines of limitations, subtle lucency is seen within the right femoral neck worrisome for a nondisplaced fracture. MRI is recommended. 2.Limited evaluation of the left hip secondary to streak artifact and motion. No obvious fractures are identified. However, an asymmetric soft tissue contusion is seen in the left flank. If the patient has left hip pain, consider MRI of the left hip as well. Urgent results were discussed with and acknowledged by Idell Pickles, MD on 01/11/2023 2:53 AM. Campbell Stall 01/11/2023 2:54 AM    CT Head without Contrast    Result Date: 01/11/2023   No evidence of acute intracranial abnormality. Susy Frizzle, MD 01/11/2023 2:53 AM                                              PLAN  Activity:  Mobility: Activity as tolerated    Further surgical  plans: No further Orthopaedic plans     RUE: WBAT   LUE:  WBAT   RLE:  WBAT   LLE:  WBAT   Diet: Adult diet Regular  Magic Cup Quantity: A. One; Flavor: Special educational needs teacher; Frequency: BID (2 times a day) - with Lunch and Dinner    Wound Care:  - Discussed that if the patient has increased drainage, incision redness, foul smell, or fevers to immediately inform the office or proceed to the Emergency Department.  - Soft Dressings (no splint or cast): patient instructed that they may shower 3 days after surgery (no baths or submerging incision). Also instructed to not scrub incision and to pat the incision dry with a clean towel.  Patients may change dressings as needed with clean gauze and tape or ACE bandages, unless otherwise instructed.      Medications:   See above for updated medication list.  - Tylenol: Patient instructed that tylenol is available and can be obtained over-the-counter. Instructed to limit dosage to no more than 3000 mg in a 24 hr period. Patients with liver problems advised to refrain from taking until discussed with primary care provider. For children, recommend weight based dosing as directed on the medication packaging.  Prior to adding Tylenol, patient instructed to verify that pain medication does not have any Tylenol in it.   - Narcotics: (Oxycodone/Dilaudid/Hydrocodone/Tramadol/etc.) Discussed that this class of medications are a prescribed opioid medication and should only be taken as instructed. For mild pain, recommend taking Tylenol/Gabapentin/Ibuprofen. For moderate/severe pain recommend combining the prescribed opioid dose along with the Tylenol/Gabapentin/Ibuprofen.  (If prescribed a dose range, recommend taking the lowest dose for moderate pain and the higher dose for severe pain). Patient instructed that if he/she experiences shortness of breath, confusion, drowsiness, or becomes unresponsive, that he/she or someone in their home should administer Narcan(Naloxone- Opioid overdose  reversal medication) and/or call 911 immediately.  Discussed that [place] narcotic law mandates that concurrent prescription of Narcan. Instructed patient to aggressively wean off of narcotic medications first. Patients also instructed that they must avoid operating a motor vehicle while taking narcotics.  - Bowel Regimen: Instructed patient to take stool softener (ex. Colace) as long as he/she is taking narcotic pain medicines. If patient is unable to have a bowel movement every 2-3 days, the patient should proceed with over the counter laxatives (Senokot -S/Miralax), enemas, or suppositories for constipation relief.  - DVT Prophylaxis: Discussed that the patient may have a blood thinner (Lovenox or Aspirin) prescribed (see medication reconciliation) Educated patient that usually, DVT prophylaxis medications are prescribed and recommended for time periods based on the injury type and procedure performed. Discussed that if DVT prescription is not filled, that they are at increased risk of having a VTE.   - NSAIDs: (Motrin, Advil, Ibuprofen, Naprosyn, Aleve) Instructed to take as prescribed, or to obtain over-the-counter. Also recommend that patients check with his/her surgeon to verify that this class is OK to be part of pain regimen. For adults, recommended Ibuprofen 800mg  every 8 hours as needed for the first week, then to PRN. Discussed that if patient is or becomes pregnant, to avoid this class of medications. For children, recommended Motrin dosed per the child's weight as instructed on the packaging. Recommend that patients with history of kidney problem do not take these medications.    Follow-up: Gregery Na, MD approximately 2 weeks from the date of your surgery  - Typically, will plan  for staple/suture removal at first orthopaedic follow-up appointment.  - she was instructed that x-rays will be obtained at first postoperative appointment, if indicated.    X-Rays (Radiology Studies):   - Patient  instructed that if their x-rays (or MRI or CT) are to be performed anywhere other than our office, that he/she must make sure that they bring the actual images to the office visit.  A radiology report is not adequate; the surgeon must visualize the actual images to accurately determine the plan of care.   [facility] Patients: All outpatient radiology studies (X-ray, CT, MRI, etc) and physical therapy will need to be obtained at a [facility]. Recommended taking prescriptions which are received as inpatient to their primary physician, so that they can enter them as a referral in the [facility] system. Radiology reports are not sufficient.  - [facility] Patients: If an upcoming surgery is planned, discussed that he/she must go to your primary care physician and get a referral for the surgery, and recommended that he/she start this process ASAP, as it takes up to a week to accomplish.      Physical/Occupational Therapy:  - Discussed that physical or occupational therapy may be prescribed for rehabilitation.The [facility] are all part of the same healthcare system as [facility] and our office. These centers accept most health insurance plans, or will participate in the financial arrangement (charity/discounted payment plan) that was put into effect when she presented to [facility].                  [1]   Past Medical History:  Diagnosis Date    HOH (hard of hearing)     Hypertension     Osteoporosis    [2]   Social History  Tobacco Use    Smoking status: Unknown   Substance Use Topics    Alcohol use: Never    Drug use: Never

## 2023-01-14 NOTE — Progress Notes (Signed)
CM faxed patient's clinicals to [facility] Admission coordinator, Dorthey Sawyer [phone]). CM will continue to follow up.     Eldridge Abrahams, MSW   Social Work Case Therapist, art [employer]  [phone]

## 2023-01-14 NOTE — Progress Notes (Signed)
Medicine Shift Note    Braden Scale Score: 17 (01/13/23 2020)  Skin Assessment: Bruising, Blanchable Redness, Surgical incision  Abrasion Skin Location: scattered  Bruising Skin Location: scattered  Patient Lines/Drains/Airways Status       Active Lines, Drains and Airways       Name Placement date Placement time Site Days    Peripheral IV 01/12/23 22 G Standard Anterior;Proximal;Right Forearm 01/12/23  2130  Forearm  1    Peripheral IV 01/12/23 22 G Anterior;Distal;Right Forearm 01/12/23  2135  Forearm  1                   Wound 01/12/23 Leg Right (Active)   Date First Assessed/Time First Assessed: 01/12/23 1533   Wound Type: Surgical Incision  Location: Leg  Wound Location Orientation: Right      Assessments 01/12/2023  3:30 PM 01/12/2023  4:10 PM   Wound Base Description Dressing Covering Site (UTA) Dressing Covering Site (UTA)   Drainage Amount None None   Dressing Gauze;Transparent Film Gauze;Transparent Film       No associated orders.       Skin  4 eyes in 4 hours pressure injury assessment note:             Bony Prominences: Check appropriate box; if wound is present enter wound assessment in LDA      Occiput:                 [x] WNL  []  Wound present  Face:                     [x] WNL  []  Wound present  Ears:                      [x] WNL  []  Wound present  Spine:                    [x] WNL  []  Wound present  Shoulders:             [x] WNL  []  Wound present  Elbows:                  [x] WNL  []  Wound present  Sacrum/coccyx:     [x] WNL  []  Wound present  Ischial Tuberosity:  [x] WNL  []  Wound present  Trochanter/Hip:      [x] WNL  []  Wound present  Knees:                   [x] WNL  []  Wound present  Ankles:                   [x] WNL  []  Wound present  Heels:                    [x] WNL  []  Wound present  Other pressure areas:  []  Wound location    If wound is present in any of the following from above, please describe findings below:         Device related: []  Device name:         LDA completed if wound present:  yes/no    Consult WOCN: yes/no    Other skin related issues, ie tears, rash, etc, document in Integumentary flowsheet  -- see above      Last BM: none this shift.    Pending Orders: na    Discharge Plan: TBD    POC: patient    Tele:  no    Activity:bed mobility x 2    PT/OT: 01-13-23  SNF    Interpreter Needs: na    Shift Note: The patient alert to self. VSS RA. No SOB/N/D.  Camera/rounding sitter in place. Scheduled and prn pain medication with effective results. Falls and safety precautions maintained. Reposition and incontinent care provided. The patient rounded on throughout the shift. Call light within the patient's reach.

## 2023-01-14 NOTE — Plan of Care (Signed)
Problem: Pain interferes with ability to perform ADL  Goal: Pain at adequate level as identified by patient  Outcome: Progressing  Flowsheets (Taken 01/14/2023 0807)  Pain at adequate level as identified by patient:   Identify patient comfort function goal   Assess for risk of opioid induced respiratory depression, including snoring/sleep apnea. Alert healthcare team of risk factors identified.   Assess pain on admission, during daily assessment and/or before any "as needed" intervention(s)   Reassess pain within 30-60 minutes of any procedure/intervention, per Pain Assessment, Intervention, Reassessment (AIR) Cycle   Evaluate if patient comfort function goal is met   Evaluate patient's satisfaction with pain management progress   Offer non-pharmacological pain management interventions     Problem: Side Effects from Pain Analgesia  Goal: Patient will experience minimal side effects of analgesic therapy  Outcome: Progressing  Flowsheets (Taken 01/14/2023 0807)  Patient will experience minimal side effects of analgesic therapy:   Monitor/assess patient's respiratory status (RR depth, effort, breath sounds)   Assess for changes in cognitive function   Prevent/manage side effects per LIP orders (i.e. nausea, vomiting, pruritus, constipation, urinary retention, etc.)   Evaluate for opioid-induced sedation with appropriate assessment tool (i.e. POSS)     Problem: Moderate/High Fall Risk Score >5  Goal: Patient will remain free of falls  Outcome: Progressing  Flowsheets (Taken 01/14/2023 0800)  High (Greater than 13):   MOD-Place Fall Risk level on whiteboard in room   HIGH-Visual cue at entrance to patient's room   HIGH-Utilize chair pad alarm for patient while in the chair   HIGH-Bed alarm on at all times while patient in bed   HIGH-Apply yellow "Fall Risk" arm band   HIGH-Pharmacy to initiate evaluation and intervention per protocol   HIGH-Initiate use of floor mats as appropriate   HIGH-Consider use of low bed      Problem: Compromised Sensory Perception  Goal: Sensory Perception Interventions  Outcome: Progressing  Flowsheets (Taken 01/14/2023 0807)  Sensory Perception Interventions: Offload heels, Pad bony prominences, Reposition q 2hrs/turn Clock, Q2 hour skin assessment under devices if present     Problem: Compromised Moisture  Goal: Moisture level Interventions  Outcome: Progressing  Flowsheets (Taken 01/14/2023 0807)  Moisture level Interventions: Moisture wicking products, Moisture barrier cream     Problem: Compromised Activity/Mobility  Goal: Activity/Mobility Interventions  Outcome: Progressing  Flowsheets (Taken 01/14/2023 0807)  Activity/Mobility Interventions: Pad bony prominences, TAP Seated positioning system when OOB, Promote PMP, Reposition q 2 hrs / turn clock, Offload heels     Problem: Compromised Nutrition  Goal: Nutrition Interventions  Outcome: Progressing  Flowsheets (Taken 01/14/2023 0807)  Nutrition Interventions: Discuss nutrition at Rounds, I&Os, Document % meal eaten, Daily weights     Problem: Compromised Friction/Shear  Goal: Friction and Shear Interventions  Outcome: Progressing  Flowsheets (Taken 01/14/2023 0807)  Friction and Shear Interventions: Pad bony prominences, Off load heels, HOB 30 degrees or less unless contraindicated, Consider: TAP seated positioning, Heel foams

## 2023-01-28 ENCOUNTER — Ambulatory Visit (INDEPENDENT_AMBULATORY_CARE_PROVIDER_SITE_OTHER): Payer: BC Managed Care – PPO | Admitting: Student in an Organized Health Care Education/Training Program

## 2023-01-29 ENCOUNTER — Ambulatory Visit (INDEPENDENT_AMBULATORY_CARE_PROVIDER_SITE_OTHER): Payer: BC Managed Care – PPO | Admitting: Orthopaedic Surgery

## 2023-02-07 ENCOUNTER — Ambulatory Visit (INDEPENDENT_AMBULATORY_CARE_PROVIDER_SITE_OTHER): Payer: Medicare Other | Admitting: Physician Assistant

## 2023-02-07 ENCOUNTER — Encounter (INDEPENDENT_AMBULATORY_CARE_PROVIDER_SITE_OTHER): Payer: Self-pay | Admitting: Physician Assistant

## 2023-02-07 VITALS — BP 127/77 | HR 83 | Ht 62.01 in | Wt 104.9 lb

## 2023-02-07 DIAGNOSIS — S72001A Fracture of unspecified part of neck of right femur, initial encounter for closed fracture: Secondary | ICD-10-CM

## 2023-02-07 DIAGNOSIS — S72009A Fracture of unspecified part of neck of unspecified femur, initial encounter for closed fracture: Secondary | ICD-10-CM

## 2023-02-07 NOTE — Progress Notes (Signed)
Orthpaedic Trauma Follow Up Visit    Chief Complaint   Patient presents with    Hip Injury     26 days s/p Right femoral neck fracture percutaneous fixation and screw placement DOS 01/12/23       HPI: 89 year old female who is 4 weeks status post percutaneous screw fixation for a nondisplaced right femoral neck fracture.  Patient is doing fine.  She has no complaints of pain.    Social History[1]    PE:   Vitals:    02/07/23 1108   BP: 127/77   Pulse: 83   SpO2: 94%     Right lower extremity exam: Surgical incisions healing well.  No erythema, warmth, drainage appreciated.  Patient's right hip active range of motion is from full extension to 110 degrees of hip flexion and 25 degrees of internal and external rotation without pain.  She has intact motor and sensory function to the distal right foot.  Warm well-perfused the distal right foot.      Radiology: Xrays taken in clinic today: none    Impression: 89 year old female who is 4 weeks status post percutaneous screw fixation for a nondisplaced right femoral neck fracture, who is doing remarkably well.    Plan:    Continue weightbearing as tolerated.  Continue Lovenox therapy for an additional 2 weeks.  Continue with 1-2 times per day physical therapy to work on gait training and lower extremity strengthening.    Follow-up as needed.    Xrays @ next clinic visit: Right hip    This note was generated within the [employer] EMR using Dragon medical speech recognition software and may contain inherent errors or omissions not intended by the user. Grammatical and punctuation errors, random word insertions, deletions, pronoun errors and incomplete sentences are occasional consequences of this technology due to software limitations. Not all errors are caught or corrected.  Although every attempt is made to root out erroneus and incomplete transcription, the note may still not fully represent the intent or opinion of the author. If there are questions or concerns about the  content of this note or information contained within the body of this dictation they should be addressed directly with the author for clarification         [1]   Social History  Tobacco Use    Smoking status: Unknown   Substance Use Topics    Alcohol use: Never    Drug use: Never
